# Patient Record
Sex: Female | Born: 1955 | Race: White | Hispanic: No | Marital: Married | Smoking: Never smoker
Health system: Southern US, Community
[De-identification: ages and names within clinical notes are randomized; demographics above are authoritative.]

## PROBLEM LIST (undated history)

## (undated) DIAGNOSIS — E119 Type 2 diabetes mellitus without complications: Secondary | ICD-10-CM

## (undated) DIAGNOSIS — E039 Hypothyroidism, unspecified: Secondary | ICD-10-CM

## (undated) DIAGNOSIS — F419 Anxiety disorder, unspecified: Secondary | ICD-10-CM

## (undated) DIAGNOSIS — F329 Major depressive disorder, single episode, unspecified: Secondary | ICD-10-CM

## (undated) DIAGNOSIS — F32A Depression, unspecified: Secondary | ICD-10-CM

## (undated) DIAGNOSIS — D649 Anemia, unspecified: Secondary | ICD-10-CM

## (undated) HISTORY — DX: Anemia, unspecified: D64.9

## (undated) HISTORY — PX: TONSILLECTOMY: SUR1361

## (undated) HISTORY — DX: Hypothyroidism, unspecified: E03.9

## (undated) HISTORY — DX: Depression, unspecified: F32.A

## (undated) HISTORY — DX: Major depressive disorder, single episode, unspecified: F32.9

## (undated) HISTORY — DX: Anxiety disorder, unspecified: F41.9

## (undated) HISTORY — PX: TUBAL LIGATION: SHX77

---
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
## 1999-06-25 ENCOUNTER — Encounter: Payer: Self-pay | Admitting: Family Medicine

## 1999-06-25 ENCOUNTER — Encounter: Admission: RE | Admit: 1999-06-25 | Discharge: 1999-06-25 | Payer: Self-pay | Admitting: Family Medicine

## 2000-11-04 ENCOUNTER — Encounter: Admission: RE | Admit: 2000-11-04 | Discharge: 2000-11-04 | Payer: Self-pay | Admitting: Internal Medicine

## 2000-11-04 ENCOUNTER — Encounter: Payer: Self-pay | Admitting: Internal Medicine

## 2000-12-06 ENCOUNTER — Other Ambulatory Visit: Admission: RE | Admit: 2000-12-06 | Discharge: 2000-12-06 | Payer: Self-pay | Admitting: Internal Medicine

## 2004-01-26 LAB — HM MAMMOGRAPHY: HM Mammogram: NORMAL

## 2004-02-10 ENCOUNTER — Other Ambulatory Visit: Admission: RE | Admit: 2004-02-10 | Discharge: 2004-02-10 | Payer: Self-pay | Admitting: *Deleted

## 2005-08-04 ENCOUNTER — Ambulatory Visit: Payer: Self-pay | Admitting: Internal Medicine

## 2005-09-28 ENCOUNTER — Ambulatory Visit: Payer: Self-pay | Admitting: Internal Medicine

## 2005-10-05 ENCOUNTER — Ambulatory Visit: Payer: Self-pay | Admitting: Internal Medicine

## 2006-08-30 DIAGNOSIS — E039 Hypothyroidism, unspecified: Secondary | ICD-10-CM

## 2006-08-30 DIAGNOSIS — D649 Anemia, unspecified: Secondary | ICD-10-CM

## 2006-08-30 DIAGNOSIS — F329 Major depressive disorder, single episode, unspecified: Secondary | ICD-10-CM | POA: Insufficient documentation

## 2006-08-30 DIAGNOSIS — F411 Generalized anxiety disorder: Secondary | ICD-10-CM | POA: Insufficient documentation

## 2006-08-30 DIAGNOSIS — O99810 Abnormal glucose complicating pregnancy: Secondary | ICD-10-CM | POA: Insufficient documentation

## 2006-10-03 ENCOUNTER — Ambulatory Visit: Payer: Self-pay | Admitting: Internal Medicine

## 2006-10-03 DIAGNOSIS — D1739 Benign lipomatous neoplasm of skin and subcutaneous tissue of other sites: Secondary | ICD-10-CM | POA: Insufficient documentation

## 2006-10-04 ENCOUNTER — Encounter: Payer: Self-pay | Admitting: Internal Medicine

## 2006-12-13 ENCOUNTER — Encounter: Payer: Self-pay | Admitting: Internal Medicine

## 2007-03-21 ENCOUNTER — Telehealth: Payer: Self-pay | Admitting: *Deleted

## 2007-04-27 ENCOUNTER — Ambulatory Visit: Payer: Self-pay | Admitting: Internal Medicine

## 2007-04-27 LAB — CONVERTED CEMR LAB
Bilirubin Urine: NEGATIVE
Blood in Urine, dipstick: NEGATIVE
Glucose, Urine, Semiquant: NEGATIVE
Ketones, urine, test strip: NEGATIVE
Nitrite: NEGATIVE
Protein, U semiquant: NEGATIVE
Specific Gravity, Urine: 1.025
Urobilinogen, UA: 0.2
WBC Urine, dipstick: NEGATIVE
pH: 5.5

## 2007-05-08 ENCOUNTER — Telehealth: Payer: Self-pay | Admitting: *Deleted

## 2007-05-08 LAB — CONVERTED CEMR LAB
ALT: 28 units/L (ref 0–35)
AST: 25 units/L (ref 0–37)
Albumin: 3.8 g/dL (ref 3.5–5.2)
Alkaline Phosphatase: 56 units/L (ref 39–117)
BUN: 13 mg/dL (ref 6–23)
Bilirubin, Direct: 0.1 mg/dL (ref 0.0–0.3)
CO2: 29 meq/L (ref 19–32)
Calcium: 8.7 mg/dL (ref 8.4–10.5)
Chloride: 101 meq/L (ref 96–112)
Cholesterol: 189 mg/dL (ref 0–200)
Creatinine, Ser: 0.7 mg/dL (ref 0.4–1.2)
GFR calc Af Amer: 114 mL/min
GFR calc non Af Amer: 94 mL/min
Glucose, Bld: 130 mg/dL — ABNORMAL HIGH (ref 70–99)
HDL: 37.7 mg/dL — ABNORMAL LOW (ref >39.0)
LDL Cholesterol: 120 mg/dL — ABNORMAL HIGH (ref 0–99)
Potassium: 4.3 meq/L (ref 3.5–5.1)
Sodium: 137 meq/L (ref 135–145)
TSH: 5.51 microintl units/mL — ABNORMAL HIGH (ref 0.35–5.50)
Total Bilirubin: 0.8 mg/dL (ref 0.3–1.2)
Total CHOL/HDL Ratio: 5
Total Protein: 6.9 g/dL (ref 6.0–8.3)
Triglycerides: 155 mg/dL — ABNORMAL HIGH (ref 0–149)
VLDL: 31 mg/dL (ref 0–40)

## 2009-04-09 ENCOUNTER — Ambulatory Visit: Payer: Self-pay | Admitting: Internal Medicine

## 2009-04-09 LAB — CONVERTED CEMR LAB
ALT: 62 units/L — ABNORMAL HIGH (ref 0–35)
AST: 77 units/L — ABNORMAL HIGH (ref 0–37)
Albumin: 4.1 g/dL (ref 3.5–5.2)
Alkaline Phosphatase: 68 units/L (ref 39–117)
BUN: 11 mg/dL (ref 6–23)
Basophils Absolute: 0 10*3/uL (ref 0.0–0.1)
Basophils Relative: 0.3 % (ref 0.0–3.0)
Bilirubin Urine: NEGATIVE
Bilirubin, Direct: 0.1 mg/dL (ref 0.0–0.3)
CO2: 31 meq/L (ref 19–32)
Calcium: 9.1 mg/dL (ref 8.4–10.5)
Chloride: 106 meq/L (ref 96–112)
Cholesterol: 214 mg/dL — ABNORMAL HIGH (ref 0–200)
Creatinine, Ser: 0.8 mg/dL (ref 0.4–1.2)
Direct LDL: 163.8 mg/dL
Eosinophils Absolute: 0.2 10*3/uL (ref 0.0–0.7)
Eosinophils Relative: 4 % (ref 0.0–5.0)
GFR calc non Af Amer: 79.99 mL/min (ref >60)
Glucose, Bld: 137 mg/dL — ABNORMAL HIGH (ref 70–99)
Glucose, Urine, Semiquant: NEGATIVE
HCT: 37.8 % (ref 36.0–46.0)
HDL: 46.1 mg/dL (ref >39.00)
Hemoglobin: 11.9 g/dL — ABNORMAL LOW (ref 12.0–15.0)
Ketones, urine, test strip: NEGATIVE
Lymphocytes Relative: 34.2 % (ref 12.0–46.0)
Lymphs Abs: 1.8 10*3/uL (ref 0.7–4.0)
MCHC: 31.4 g/dL (ref 30.0–36.0)
MCV: 83.8 fL (ref 78.0–100.0)
Monocytes Absolute: 0.4 10*3/uL (ref 0.1–1.0)
Monocytes Relative: 8.2 % (ref 3.0–12.0)
Neutro Abs: 2.8 10*3/uL (ref 1.4–7.7)
Neutrophils Relative %: 53.3 % (ref 43.0–77.0)
Nitrite: NEGATIVE
Platelets: 212 10*3/uL (ref 150.0–400.0)
Potassium: 4.4 meq/L (ref 3.5–5.1)
Protein, U semiquant: NEGATIVE
RBC: 4.51 M/uL (ref 3.87–5.11)
RDW: 17.4 % — ABNORMAL HIGH (ref 11.5–14.6)
Sodium: 142 meq/L (ref 135–145)
Specific Gravity, Urine: 1.025
TSH: 45.69 microintl units/mL — ABNORMAL HIGH (ref 0.35–5.50)
Total Bilirubin: 1 mg/dL (ref 0.3–1.2)
Total CHOL/HDL Ratio: 5
Total Protein: 7.9 g/dL (ref 6.0–8.3)
Triglycerides: 164 mg/dL — ABNORMAL HIGH (ref 0.0–149.0)
Urobilinogen, UA: 0.2
VLDL: 32.8 mg/dL (ref 0.0–40.0)
WBC Urine, dipstick: NEGATIVE
WBC: 5.2 10*3/uL (ref 4.5–10.5)
pH: 6

## 2009-04-16 ENCOUNTER — Other Ambulatory Visit: Admission: RE | Admit: 2009-04-16 | Discharge: 2009-04-16 | Payer: Self-pay | Admitting: Internal Medicine

## 2009-04-16 ENCOUNTER — Ambulatory Visit: Payer: Self-pay | Admitting: Internal Medicine

## 2009-04-16 DIAGNOSIS — R7309 Other abnormal glucose: Secondary | ICD-10-CM | POA: Insufficient documentation

## 2009-04-16 DIAGNOSIS — R945 Abnormal results of liver function studies: Secondary | ICD-10-CM | POA: Insufficient documentation

## 2009-04-16 DIAGNOSIS — D485 Neoplasm of uncertain behavior of skin: Secondary | ICD-10-CM

## 2009-04-16 DIAGNOSIS — Z780 Asymptomatic menopausal state: Secondary | ICD-10-CM | POA: Insufficient documentation

## 2009-04-16 LAB — HM PAP SMEAR

## 2009-04-30 ENCOUNTER — Telehealth: Payer: Self-pay | Admitting: Internal Medicine

## 2009-05-19 ENCOUNTER — Ambulatory Visit: Payer: Self-pay | Admitting: Internal Medicine

## 2009-05-19 ENCOUNTER — Ambulatory Visit: Payer: Self-pay | Admitting: Cardiovascular Disease

## 2009-05-19 DIAGNOSIS — R51 Headache: Secondary | ICD-10-CM

## 2009-05-19 DIAGNOSIS — R519 Headache, unspecified: Secondary | ICD-10-CM | POA: Insufficient documentation

## 2009-05-19 LAB — CONVERTED CEMR LAB
HCV Ab: NEGATIVE
Hep B S Ab: NEGATIVE
Hepatitis B Surface Ag: NEGATIVE

## 2009-05-26 LAB — CONVERTED CEMR LAB
ALT: 65 units/L — ABNORMAL HIGH (ref 0–35)
AST: 76 units/L — ABNORMAL HIGH (ref 0–37)
Albumin: 4.3 g/dL (ref 3.5–5.2)
Alkaline Phosphatase: 59 units/L (ref 39–117)
BUN: 10 mg/dL (ref 6–23)
Bilirubin, Direct: 0 mg/dL (ref 0.0–0.3)
CO2: 30 meq/L (ref 19–32)
Calcium: 9 mg/dL (ref 8.4–10.5)
Chloride: 99 meq/L (ref 96–112)
Creatinine, Ser: 0.7 mg/dL (ref 0.4–1.2)
Free T4: 1 ng/dL (ref 0.6–1.6)
GFR calc non Af Amer: 93.28 mL/min (ref >60)
Glucose, Bld: 115 mg/dL — ABNORMAL HIGH (ref 70–99)
Hgb A1c MFr Bld: 6.9 % — ABNORMAL HIGH (ref 4.6–6.5)
Potassium: 4 meq/L (ref 3.5–5.1)
Sodium: 139 meq/L (ref 135–145)
TSH: 6.36 microintl units/mL — ABNORMAL HIGH (ref 0.35–5.50)
Total Bilirubin: 1.3 mg/dL — ABNORMAL HIGH (ref 0.3–1.2)
Total Protein: 8.1 g/dL (ref 6.0–8.3)

## 2009-07-03 ENCOUNTER — Ambulatory Visit: Payer: Self-pay | Admitting: Internal Medicine

## 2010-01-01 ENCOUNTER — Ambulatory Visit: Payer: Self-pay | Admitting: Internal Medicine

## 2010-02-22 LAB — CONVERTED CEMR LAB: Pap Smear: NEGATIVE

## 2010-02-24 NOTE — Assessment & Plan Note (Signed)
Summary: cpx/ssc   Vital Signs:  Patient profile:   55 year old female Menstrual status:  perimenopausal LMP:     08/25/2008 Height:      68 inches Weight:      270 pounds BMI:     41.20 Pulse rate:   66 / minute BP sitting:   120 / 80  (right arm) Cuff size:   large  Vitals Entered By: Romualdo Bolk, CMA (AAMA) (April 16, 2009 10:40 AM)  Nutrition Counseling: Patient's BMI is greater than 25 and therefore counseled on weight management options. CC: CPX with pap  Vision Screening:Left eye with correction: 20 / 15 Right eye with correction: 20 / 20 Both eyes with correction: 20 / 15        Vision Entered By: Romualdo Bolk, CMA [name] (April 16, 2009 10:57 AM) LMP (date): 08/25/2008 LMP - Character: 6 months ago Menarche (age onset years): 13    Menstrual Status perimenopausal Enter LMP: 08/25/2008   History of Present Illness: Alisha Williams comes in today for  preventive visit. Sh has not been in for a while as taking care of everyone else.  had stopped  synthroid for cost reasons when lost job. Now has a new one.  Has gained weight an is very fatigues . snores but no OSA symptom .  Sinus HA  hurts for 3 days.   spring and fall and face pain and then progresses.  Tried claritin and 3 advil  and went to bed.  Last period   6 months ago.   Had been fairly regular.   Preventive Care Screening  Mammogram:    Date:  01/26/2004    Results:  normal    Preventive Screening-Counseling & Management  Alcohol-Tobacco     Alcohol drinks/day: 0     Smoking Status: never  Caffeine-Diet-Exercise     Caffeine use/day: 1     Does Patient Exercise: no  Hep-HIV-STD-Contraception     Dental Visit-last 6 months no     Sun Exposure-Excessive: no  Safety-Violence-Falls     Seat Belt Use: yes     Firearms in the Home: no firearms in the home     Smoke Detectors: yes  EKG  Procedure date:  04/16/2009  Findings:      Normal sinus rhythm with rate of:   86  Current Medications (verified): 1)  Synthroid 100 Mcg  Tabs (Levothyroxine Sodium) .Marland Kitchen.. 1 By Mouth Once Daily 2)  Ferrous Sulfate 325 (65 Fe) Mg  Tabs (Ferrous Sulfate) 3)  Multiple Vitamin   Tabs (Multiple Vitamin)  Allergies (verified): No Known Drug Allergies  Past History:  Past medical, surgical, family and social histories (including risk factors) reviewed, and no changes noted (except as noted below).  Past Medical History: Anemia-NOS Anxiety Depression Hypothyroidism G3P3  Past Surgical History: Tonsillectomy Tubal ligation C-Section "Stretch kidney tube" 1970  Past History:  Care Management: None Current  Family History: Reviewed history and no changes required. Father: Heart Attack- now healthy and on no meds Mother: thyroid problems, HBP, fibromyalgia, arthristis, breast cancer Siblings: Sister- Breast Cancer  dx age 43  and thyroid problems   Social History: Reviewed history from 10/03/2006 and no changes required. 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  Caffeine use/day:  1 Dental Care w/in 6 mos.:  no Sun Exposure-Excessive:  no Risk analyst Use:  yes  Review of Systems       The patient complains of  weight gain.  The patient denies anorexia, fever, weight loss, vision loss, decreased hearing, hoarseness, chest pain, syncope, dyspnea on exertion, peripheral edema, prolonged cough, headaches, hemoptysis, abdominal pain, melena, hematochezia, severe indigestion/heartburn, hematuria, genital sores, muscle weakness, transient blindness, difficulty walking, depression, unusual weight change, abnormal bleeding, enlarged lymph nodes, angioedema, and breast masses.         monitoring  2 moles on back    Physical Exam General Appearance: well developed, well nourished, no acute distress Eyes: conjunctiva and lids normal, PERRLA, EOMI,  WNL Ears, Nose, Mouth, Throat: TM clear, nares clear, oral exam WNL Neck:  supple, no lymphadenopathy, no thyromegaly, no JVD Respiratory: clear to auscultation and percussion, respiratory effort normal Cardiovascular: regular rate and rhythm, S1-S2, no murmur, rub or gallop, no bruits, peripheral pulses normal and symmetric, no cyanosis, clubbing, edema or varicosities Chest: no scars, masses, tenderness; no asymmetry, skin changes, nipple discharge   Gastrointestinal: soft, non-tender; no hepatosplenomegaly, masses; active bowel sounds all quadrants, guaiac negative stool; no masses, tenderness, hemorrhoids  Genitourinary: no vaginal discharge, lesions; no masses or tenderness exam limited by large abdlominal wall     Pap done  creamy discharge. Lymphatic: no cervical, axillary or inguinal adenopathy Musculoskeletal: gait normal, muscle tone and strength WNL, no joint swelling, effusions, discoloration, crepitus  Skin: clear, good turgor, color WNL, no rashes, , or ulcerations  back with 3 mm very dark slightly raised mole  slight irreg border .   large lipoma left back non tender Neurologic: normal mental status, normal reflexes, normal strength, sensation, and motion Psychiatric: alert; oriented to person, place and time Other Exam:  EKG nl   LAb  elevated fbs 137  elevated transaminases, TSH over 40   hg 11.9     Impression & Recommendations:  Problem # 1:  PREVENTIVE HEALTH CARE (ICD-V70.0)  Discussed nutrition,exercise,diet,healthy weight, vitamin D and calcium.   colonoscopy referral. encourage tdap  booster  Orders: EKG w/ Interpretation (93000)  Problem # 2:  ROUTINE GYNECOLOGICAL EXAM (ICD-V72.31) pap due   Problem # 3:  PERIMENOPAUSAL STATUS (ICD-V49.81) disc   Expectant management   Problem # 4:  HYPERGLYCEMIA (ICD-790.29) t high risk for daibetes and has had sig weight gain since off thyroid replacement.  lifestyle intervention for now and follow up  may need to add medication   Problem # 5:  LIVER FUNCTION TESTS, ABNORMAL  (ICD-794.8) Assessment: New poss from  fatty liver weill follow up   Problem # 6:  HYPOTHYROIDISM (ICD-244.9)  off med for a while and metabolic and symptom effect  Her updated medication list for this problem includes:    Synthroid 100 Mcg Tabs (Levothyroxine sodium) .Marland Kitchen... 1 by mouth once daily    Levothyroxine Sodium 100 Mcg Tabs (Levothyroxine sodium) .Marland Kitchen... 1 by mouth once daily  Labs Reviewed: TSH: 45.69 (04/09/2009)    Chol: 214 (04/09/2009)   HDL: 46.10 (04/09/2009)   LDL: 120 (04/27/2007)   TG: 164.0 (04/09/2009)  Problem # 7:  LIPOMA, SKIN (ICD-214.1) about the same   Problem # 8:  NEOPLASM, SKIN, UNCERTAIN BEHAVIOR (ICD-238.2) Assessment: New  back mole  although small is concerning and needs to be checked by derm  .  Orders: Dermatology Referral (Derma)  Problem # 9:  allergies  and ha  disc conssrvative rx   follow up if needed  Complete Medication List: 1)  Synthroid 100 Mcg Tabs (Levothyroxine sodium) .Marland Kitchen.. 1 by mouth once daily 2)  Ferrous Sulfate 325 (65 Fe) Mg Tabs (Ferrous sulfate)  3)  Multiple Vitamin Tabs (Multiple vitamin) 4)  Levothyroxine Sodium 100 Mcg Tabs (Levothyroxine sodium) .Marland Kitchen.. 1 by mouth once daily  Other Orders: Gastroenterology Referral (GI) Tdap => 68yrs IM (16109) Admin 1st Vaccine (60454)  Patient Instructions: 1)  try daily claritin consider nasal steroid also for allergy. 2)  Derm referral. 3)  Begin   thyroid medication 4)  BMP prior to visit, ICD-9:  5)  Hepatic Panel prior to visit ICD-9:  6)  TSH prior to visit ICD-9 :  244.9 7)  HgBA1c prior to visit  ICD-9:  8)  hepatitis  B ag , hep B aby hep c aby  dx abnormal lfts  9)  Please schedule a follow-up appointment in 6-8 weeks  10)    11)  You need to lose weight. Consider a lower calorie diet and regular exercise.  12)  It is important that you exercise reguarly at least 20 minutes 5 times a week. If you develop chest pain, have severe difficulty breathing, or feel very  tired, stop exercising immediately and seek medical attention.  Prescriptions: LEVOTHYROXINE SODIUM 100 MCG TABS (LEVOTHYROXINE SODIUM) 1 by mouth once daily  #90 x 2   Entered and Authorized by:   Madelin Headings MD   Signed by:   Madelin Headings MD on 04/16/2009   Method used:   Print then Give to Patient   RxID:   [id number]    Immunizations Administered:  Tetanus Vaccine:    Vaccine Type: Tdap    Site: right deltoid    Mfr: [email or web address]    Dose: 0.5 ml    Route: IM    Given by: Romualdo Bolk, CMA (AAMA)    Exp. Date: 04/19/2011    Lot #: [id number]

## 2010-02-24 NOTE — Progress Notes (Signed)
----   Converted from flag ---- ---- 04/29/2009 11:27 AM, Romualdo Bolk, CMA (AAMA) wrote:   ---- 04/29/2009 11:20 AM, Corky Mull wrote: Revonda Standard from [facility] called to say pt has declined to have colonoscopy done right now - pt would re-consider in Sept or Oct.    I will need to cancel this referral, so just send me another one around Sept. Thanks. ------------------------------

## 2010-02-24 NOTE — Assessment & Plan Note (Signed)
Summary: MIGRAINE H/A // RS   Vital Signs:  Patient profile:   55 year old female Menstrual status:  perimenopausal Weight:      264 pounds Temp:     98.5 degrees F oral Pulse rate:   72 / minute BP sitting:   130 / 70  (right arm) Cuff size:   large  Vitals Entered By: Romualdo Bolk, CMA (AAMA) (May 19, 2009 9:52 AM) CC: Headaches x 5 weeks- Pt is having senstivitiy to light and sound. Pt is also having nausea with the ha's. Headache is behind her eyes and in the back of her head.   History of Present Illness: Alisha Williams comesin comes in today  as an SDA for above problem .  See last check up  and due for labs in the future  Ongoing HAs  since last visit and claritin and advil helped   some but since then  HA didnt go away and  tried  advil  and  excedrin Migraine     after a while no help and stopped.  for 3 days ago.   some worse . Waxes and wanes.      HAs usually not a problem.  No vision changes  novomiting  nausea    sensitive to light and sound.   8/10     level of pain.  No weakness or numbness balance problem or speech problems.  Preventive Screening-Counseling & Management  Alcohol-Tobacco     Alcohol drinks/day: 0     Smoking Status: never  Caffeine-Diet-Exercise     Caffeine use/day: 1     Does Patient Exercise: no  Current Medications (verified): 1)  Ferrous Sulfate 325 (65 Fe) Mg  Tabs (Ferrous Sulfate) 2)  Multiple Vitamin   Tabs (Multiple Vitamin) 3)  Levothyroxine Sodium 100 Mcg Tabs (Levothyroxine Sodium) .Marland Kitchen.. 1 By Mouth Once Daily  Allergies (verified): No Known Drug Allergies  Past History:  Past medical, surgical, family and social histories (including risk factors) reviewed, and no changes noted (except as noted below).  Past Medical History: Reviewed history from 04/16/2009 and no changes required. Anemia-NOS Anxiety Depression Hypothyroidism G3P3  Past Surgical History: Reviewed history from 04/16/2009 and no changes  required. Tonsillectomy Tubal ligation C-Section "Stretch kidney tube" 1970  Past History:  Care Management: None Current  Family History: Reviewed history from 04/16/2009 and no changes required. Father: Heart Attack- now healthy and on no meds Mother: thyroid problems, HBP, fibromyalgia, arthristis, breast cancer Siblings: Sister- Breast Cancer  dx age 69  and thyroid problems   Social History: Reviewed history from 04/16/2009 and no changes required. 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   Review of Systems       The patient complains of anorexia.  The patient denies weight loss, weight gain, vision loss, decreased hearing, hoarseness, chest pain, syncope, dyspnea on exertion, peripheral edema, prolonged cough, hemoptysis, abdominal pain, melena, hematochezia, muscle weakness, transient blindness, difficulty walking, abnormal bleeding, enlarged lymph nodes, and angioedema.    Physical Exam  General:  well-developed, well-nourished, and well-hydrated. uncomfortable in nad  non toxic  Head:  normocephalic and atraumatic.   Eyes:  vision grossly intact, pupils equal, pupils round, and pupils reactive to light. wears glasses   eoms nl fundi ?  discrete discs  Ears:  R ear normal and L ear normal.   Mouth:  pharynx pink and moist.   tongue midline Neck:  No deformities, masses, or tenderness  noted. Lungs:  Normal respiratory effort, chest expands symmetrically. Lungs are clear to auscultation, no crackles or wheezes. Heart:  Normal rate and regular rhythm. S1 and S2 normal without gallop, murmur, click, rub or other extra sounds. Abdomen:  soft, non-tender, no hepatomegaly, and no splenomegaly.   Pulses:  pulses intact without delay   Extremities:  no clubbing cyanosis or edema  Neurologic:  alert & oriented X3, cranial nerves II-XII intact, strength normal in all extremities, gait normal, DTRs symmetrical and normal, finger-to-nose  normal, and Romberg negative.   Skin:  turgor normal, color normal, no ecchymoses, and no petechiae.   Cervical Nodes:  No lymphadenopathy noted Psych:  Oriented X3, good eye contact, not anxious appearing, and not depressed appearing.     Impression & Recommendations:  Problem # 1:  HEADACHE (ICD-784.0) Assessment New new kind in that it is protracted and ongoing   and   taking  daily meds at present   non focal exam   today.  disc optinos for rx and  follow up  get imaging  today.   Her updated medication list for this problem includes:    Norco 5-325 Mg Tabs (Hydrocodone-acetaminophen) .Marland Kitchen... 1 by mouth q4-6 hours as needed rescue medicine for ha  Orders: Radiology Referral (Radiology) Ketorolac-Toradol 15mg  [phone]) Admin of Therapeutic Inj  intramuscular or subcutaneous (13086)  Problem # 2:  HYPOTHYROIDISM (ICD-244.9) started  thyroid med recently but dont really think thats the problem    will check labs today a bit early     The following medications were removed from the medication list:    Synthroid 100 Mcg Tabs (Levothyroxine sodium) .Marland Kitchen... 1 by mouth once daily Her updated medication list for this problem includes:    Levothyroxine Sodium 100 Mcg Tabs (Levothyroxine sodium) .Marland Kitchen... 1 by mouth once daily  Orders: TLB-TSH (Thyroid Stimulating Hormone) (84443-TSH) TLB-T4 (Thyrox), Free [phone])  Problem # 3:  LIVER FUNCTION TESTS, ABNORMAL (ICD-794.8) Assessment: Comment Only  Orders: TLB-Hepatic/Liver Function Pnl (80076-HEPATIC) T-Hepatitis B Surface Antigen (52841-32440) T-Hepatitis B Surface Antibody (10272-53664) T-Hepatitis C Antibody (40347-42595)  Problem # 4:  HYPERGLYCEMIA (ICD-790.29) check today.    may be early diabetic or prediabetic. Orders: TLB-A1C / Hgb A1C (Glycohemoglobin) (83036-A1C)  Complete Medication List: 1)  Ferrous Sulfate 325 (65 Fe) Mg Tabs (Ferrous sulfate) 2)  Multiple Vitamin Tabs (Multiple vitamin) 3)  Levothyroxine Sodium 100  Mcg Tabs (Levothyroxine sodium) .Marland Kitchen.. 1 by mouth once daily 4)  Norco 5-325 Mg Tabs (Hydrocodone-acetaminophen) .Marland Kitchen.. 1 by mouth q4-6 hours as needed rescue medicine for ha 5)  Promethazine Hcl 25 Mg Tabs (Promethazine hcl) .Marland Kitchen.. 1 by mouth q4-6 hours as needed nausea  Other Orders: Venipuncture (63875) TLB-BMP (Basic Metabolic Panel-BMET) (80048-METABOL)  Patient Instructions: 1)  Get ct scan  2)  .labs  and if not better consider HA referral  3)  Pain med as  helpful   4)  call tomorrow as needed.  Prescriptions: PROMETHAZINE HCL 25 MG TABS (PROMETHAZINE HCL) 1 by mouth q4-6 hours as needed nausea  #12 x 0   Entered and Authorized by:   Madelin Headings MD   Signed by:   Madelin Headings MD on 05/19/2009   Method used:   Print then Give to Patient   RxID:   [id number] NORCO 5-325 MG TABS (HYDROCODONE-ACETAMINOPHEN) 1 by mouth q4-6 hours as needed rescue medicine for HA  #10 x 0   Entered and Authorized by:   Madelin Headings MD   Signed  by:   Madelin Headings MD on 05/19/2009   Method used:   Print then Give to Patient   RxID:   [id number]    Medication Administration  Injection # 1:    Medication: Ketorolac-Toradol 15mg     Diagnosis: HEADACHE (ICD-784.0)    Route: IM    Site: RUOQ gluteus    Exp Date: 08/26/2010    Lot #: [id number]    Mfr: [email or web address]    Patient tolerated injection without complications    Given by: Romualdo Bolk, CMA [name] (May 19, 2009 10:34 AM)  Orders Added: 1)  Venipuncture [14782] 2)  TLB-BMP (Basic Metabolic Panel-BMET) [80048-METABOL] 3)  TLB-TSH (Thyroid Stimulating Hormone) [84443-TSH] 4)  TLB-Hepatic/Liver Function Pnl [80076-HEPATIC] 5)  TLB-A1C / Hgb A1C (Glycohemoglobin) [83036-A1C] 6)  TLB-T4 (Thyrox), Free [95621-HY8M] 7)  T-Hepatitis B Surface Antigen [57846-96295] 8)  T-Hepatitis B Surface Antibody [28413-24401] 9)  T-Hepatitis C Antibody [02725-36644] 10)  Radiology Referral [Radiology] 11)  Ketorolac-Toradol 15mg   [J1885] 12)  Admin of Therapeutic Inj  intramuscular or subcutaneous [96372] 13)  Est. Patient Level IV [03474]

## 2010-10-22 ENCOUNTER — Encounter: Payer: Self-pay | Admitting: Family Medicine

## 2010-10-22 ENCOUNTER — Encounter: Payer: Self-pay | Admitting: Internal Medicine

## 2010-10-22 ENCOUNTER — Ambulatory Visit (INDEPENDENT_AMBULATORY_CARE_PROVIDER_SITE_OTHER): Payer: BC Managed Care – PPO | Admitting: Family Medicine

## 2010-10-22 VITALS — BP 130/82 | Temp 98.8°F | Wt 262.0 lb

## 2010-10-22 DIAGNOSIS — E039 Hypothyroidism, unspecified: Secondary | ICD-10-CM

## 2010-10-22 DIAGNOSIS — R300 Dysuria: Secondary | ICD-10-CM

## 2010-10-22 LAB — POCT URINALYSIS DIPSTICK: Glucose, UA: NEGATIVE

## 2010-10-22 MED ORDER — CIPROFLOXACIN HCL 500 MG PO TABS: 500.0000 mg | ORAL_TABLET | Freq: Two times a day (BID) | ORAL | Status: AC

## 2010-10-22 MED ORDER — LEVOTHYROXINE SODIUM 100 MCG PO TABS: 100.0000 ug | ORAL_TABLET | Freq: Every day | ORAL | Status: DC

## 2010-10-22 NOTE — Patient Instructions (Signed)
Urinary Tract Infection (UTI)   Infections of the urinary tract can start in several places. A bladder infection (cystitis), a kidney infection (pyelonephritis), and a prostate infection (prostatitis) are different types of urinary tract infections. They usually get better if treated with medicines (antibiotics) that kill germs. Take all the medicine until it is gone. You or your child may feel better in a few days, but TAKE ALL MEDICINE or the infection may not respond and may become more difficult to treat.   HOME CARE INSTRUCTIONS   Drink enough water and fluids to keep the urine clear or pale yellow. Cranberry juice is especially recommended, in addition to large amounts of water.   Avoid caffeine, tea, and carbonated beverages. They tend to irritate the bladder.   Alcohol may irritate the prostate.   Only take over-the-counter or prescription medicines for pain, discomfort, or fever as directed by your caregiver.   FINDING OUT THE RESULTS OF YOUR TEST   Not all test results are available during your visit. If your or your child's test results are not back during the visit, make an appointment with your caregiver to find out the results. Do not assume everything is normal if you have not heard from your caregiver or the medical facility. It is important for you to follow up on all test results.   TO PREVENT FURTHER INFECTIONS:   Empty the bladder often. Avoid holding urine for long periods of time.   After a bowel movement, women should cleanse from front to back. Use each tissue only once.   Empty the bladder before and after sexual intercourse.   SEEK MEDICAL CARE IF:   There is back pain.   You or your child has an oral temperature above 101.   Your baby is older than 3 months with a rectal temperature of 100.5º F (38.1° C) or higher for more than 1 day.   Your or your child's problems (symptoms) are no better in 3 days. Return sooner if you or your child is getting worse.   SEEK IMMEDIATE MEDICAL CARE IF:    There is severe back pain or lower abdominal pain.   You or your child develops chills.   You or your child has an oral temperature above 101, not controlled by medicine.   Your baby is older than 3 months with a rectal temperature of 102º F (38.9º C) or higher.   Your baby is 3 months old or younger with a rectal temperature of 100.4º F (38º C) or higher.   There is nausea or vomiting.   There is continued burning or discomfort with urination.   MAKE SURE YOU:   Understand these instructions.   Will watch this condition.   Will get help right away if you or your child is not doing well or gets worse.   Document Released: 10/21/2004 Document Re-Released: 04/07/2009   [employer]® Patient Information ©2011 [employer].

## 2010-10-22 NOTE — Progress Notes (Signed)
  Subjective:    Patient ID: Alisha Williams, female    DOB: 01-03-1956, 55 y.o.   MRN: [id number]  HPI Patient seen with 3 day history of dysuria. Frequent urination and burning with urination. No fever or chills. No nausea or vomiting. Patient has no known drug allergies. Took Azo-Standard which helps symptoms somewhat until earlier today.  Hypothyroidism. Recently ran out of insurance coverage and has been off this medication for several weeks.   Review of Systems  Constitutional: Positive for fatigue. Negative for fever and chills.  Gastrointestinal: Negative for nausea, vomiting and abdominal pain.  Genitourinary: Positive for dysuria and urgency. Negative for hematuria.       Objective:   Physical Exam  Constitutional: She appears well-developed and well-nourished.  HENT:  Mouth/Throat: Oropharynx is clear and moist.  Cardiovascular: Normal rate, regular rhythm and normal heart sounds.   Pulmonary/Chest: Effort normal and breath sounds normal. No respiratory distress. She has no wheezes. She has no rales.  Neurological: She is alert.          Assessment & Plan:  #1 uncomplicated cystitis. Cipro 500 mg twice daily for 5 days #2 hypothyroidism. Get back on levothyroxin 100 mcg daily and followup with primary 3 months to reassess

## 2012-10-24 ENCOUNTER — Encounter (HOSPITAL_COMMUNITY): Payer: Self-pay | Admitting: Emergency Medicine

## 2012-10-24 ENCOUNTER — Emergency Department (HOSPITAL_COMMUNITY)
Admission: EM | Admit: 2012-10-24 | Discharge: 2012-10-25 | Disposition: A | Discharge status: Home / Self Care | Payer: BC Managed Care – PPO | Attending: Emergency Medicine | Admitting: Emergency Medicine

## 2012-10-24 ENCOUNTER — Emergency Department (HOSPITAL_COMMUNITY): Payer: BC Managed Care – PPO

## 2012-10-24 DIAGNOSIS — E039 Hypothyroidism, unspecified: Secondary | ICD-10-CM | POA: Insufficient documentation

## 2012-10-24 DIAGNOSIS — Y939 Activity, unspecified: Secondary | ICD-10-CM | POA: Insufficient documentation

## 2012-10-24 DIAGNOSIS — Z8659 Personal history of other mental and behavioral disorders: Secondary | ICD-10-CM | POA: Insufficient documentation

## 2012-10-24 DIAGNOSIS — R0789 Other chest pain: Secondary | ICD-10-CM

## 2012-10-24 DIAGNOSIS — W1809XA Striking against other object with subsequent fall, initial encounter: Secondary | ICD-10-CM | POA: Insufficient documentation

## 2012-10-24 DIAGNOSIS — Z862 Personal history of diseases of the blood and blood-forming organs and certain disorders involving the immune mechanism: Secondary | ICD-10-CM | POA: Insufficient documentation

## 2012-10-24 DIAGNOSIS — S298XXA Other specified injuries of thorax, initial encounter: Secondary | ICD-10-CM | POA: Insufficient documentation

## 2012-10-24 DIAGNOSIS — Y92009 Unspecified place in unspecified non-institutional (private) residence as the place of occurrence of the external cause: Secondary | ICD-10-CM | POA: Insufficient documentation

## 2012-10-24 MED ORDER — OXYCODONE-ACETAMINOPHEN 5-325 MG PO TABS
2.0000 | ORAL_TABLET | Freq: Once | ORAL | Status: AC
  Administered 2012-10-24: 2 via ORAL
  Filled 2012-10-24: qty 2

## 2012-10-24 MED ORDER — OXYCODONE-ACETAMINOPHEN 5-325 MG PO TABS: 1.0000 | ORAL_TABLET | Freq: Four times a day (QID) | ORAL | Status: DC | PRN

## 2012-10-24 NOTE — ED Notes (Signed)
Pt. Assisted to by by two RNs.

## 2012-10-24 NOTE — ED Notes (Signed)
Pt. tripped and fell at home this evening hit her right anterior chest against toilet seat , no LOC / ambulatory , pain at right anterior ribcage worse with movement /palpation and deep inspiration . Respirations unlabored.

## 2012-10-24 NOTE — ED Provider Notes (Signed)
CSN: [id number]     Arrival date & time 10/24/12  2027 History  This chart was scribed for non-physician practitioner Felicie Morn, NP, working with Toy Baker, MD by Ronal Fear, ED scribe. This patient was seen in room TR11C/TR11C and the patient's care was started at 11:21 PM.   Chief Complaint  Patient presents with  . Fall    Patient is a 57 y.o. female presenting with fall. The history is provided by the patient. No language interpreter was used.  Fall This is a new problem. The current episode started 3 to 5 hours ago. The problem occurs rarely. The problem has not changed since onset.Pertinent negatives include no chest pain and no shortness of breath. The symptoms are aggravated by bending, twisting and walking (deep breaths, palpation, and movement). Nothing relieves the symptoms.    HPI Comments: Lakima Dona is a 57 y.o. female who presents to the Emergency Department complaining of right lower chest wall pain after tripping, falling and hitting the toilet seat. She denies LOC and any other injuries.  Pt does not seem to be in any acute distress, and has no complaints other than chest wall pain .       Past Medical History  Diagnosis Date  . Anemia     nos  . Anxiety   . Depression   . Hypothyroidism    Past Surgical History  Procedure Laterality Date  . Tonsillectomy    . Tubal ligation    . Cesarean section    . Strectch kidney tube  1970   Family History  Problem Relation Age of Onset  . Thyroid disease Mother   . Hypertension Mother   . Arthritis Mother   . Fibromyalgia Mother   . Breast cancer Mother   . Heart attack Father   . Breast cancer Sister   . Thyroid disease Sister    History  Substance Use Topics  . Smoking status: Never Smoker   . Smokeless tobacco: Not on file  . Alcohol Use: No   OB History   Grav Para Term Preterm Abortions TAB SAB Ect Mult Living                 Review of Systems  Respiratory: Negative for shortness of breath.    Cardiovascular: Negative for chest pain.  Musculoskeletal:       Chest wall tenderness  All other systems reviewed and are negative.    Allergies  Review of patient's allergies indicates no known allergies.  Home Medications   Current Outpatient Rx  Name  Route  Sig  Dispense  Refill  . benzocaine (ORAJEL) 10 % mucosal gel   Mouth/Throat   Use as directed 1 application in the mouth or throat as needed for pain.         . ferrous sulfate 325 (65 FE) MG tablet   Oral   Take 325 mg by mouth daily with breakfast.           . MULTIPLE VITAMIN PO   Oral   Take 1 tablet by mouth daily.           BP 143/79  Pulse 87  Temp(Src) 98.7 F (37.1 C) (Oral)  Resp 16  Ht 5\' 8"  (1.727 m)  Wt 262 lb (118.842 kg)  BMI 39.85 kg/m2  SpO2 99% Physical Exam  Nursing note and vitals reviewed. Constitutional: She is oriented to person, place, and time. She appears well-developed and well-nourished. No distress.  HENT:  Head: Normocephalic and atraumatic.  Eyes: EOM are normal.  Neck: Neck supple. No tracheal deviation present.  Cardiovascular: Normal rate, regular rhythm and normal heart sounds.   Pulmonary/Chest: Effort normal and breath sounds normal. No respiratory distress. She exhibits tenderness.  Ecchymosis to affected area  Abdominal: Soft. There is no tenderness.  Musculoskeletal: Normal range of motion. She exhibits tenderness.  Neurological: She is alert and oriented to person, place, and time.  Skin: Skin is warm and dry.  Psychiatric: She has a normal mood and affect. Her behavior is normal.    ED Course  Procedures (including critical care time)  DIAGNOSTIC STUDIES: Oxygen Saturation is 99% on RA, normal by my interpretation.    COORDINATION OF CARE: 11:22 PM- Pt advised of plan for treatment including medication for pain, and advised to splint with pillow and do deep breathing excercises and pt agrees.      Labs Review Labs Reviewed - No data to  display Imaging Review Dg Ribs Unilateral W/chest Right  10/24/2012   CLINICAL DATA:  Patient fell landing on her right side. Right-sided chest wall pain.  EXAM: RIGHT RIBS AND CHEST - 3+ VIEW  COMPARISON:  None.  FINDINGS: No fracture or other bone lesions are seen involving the ribs. There is no evidence of pneumothorax or pleural effusion. Both lungs are clear. Heart size and mediastinal contours are within normal limits.  IMPRESSION: Negative.   Electronically Signed   By: Amie Portland   On: 10/24/2012 21:46   Radiology results reviewed and shared with patient.   MDM  Chest wall pain.  I personally performed the services described in this documentation, which was scribed in my presence. The recorded information has been reviewed and is accurate.    Jimmye Norman, NP 10/25/12 [phone]

## 2012-10-24 NOTE — ED Notes (Signed)
Pt is in xray then know to bring her back to ft 11

## 2012-10-25 NOTE — ED Notes (Signed)
Pt. Given incentive spirometer and taught how to use device. Verbalized understanding.

## 2012-10-27 NOTE — ED Provider Notes (Signed)
Medical screening examination/treatment/procedure(s) were performed by non-physician practitioner and as supervising physician I was immediately available for consultation/collaboration.  Cassaundra Rasch T Vera Furniss, MD 10/27/12 0709 

## 2013-01-29 ENCOUNTER — Ambulatory Visit (INDEPENDENT_AMBULATORY_CARE_PROVIDER_SITE_OTHER): Payer: BC Managed Care – PPO | Admitting: Internal Medicine

## 2013-01-29 ENCOUNTER — Encounter: Payer: Self-pay | Admitting: Internal Medicine

## 2013-01-29 VITALS — BP 130/80 | HR 87 | Temp 98.2°F | Wt 247.0 lb

## 2013-01-29 DIAGNOSIS — E039 Hypothyroidism, unspecified: Secondary | ICD-10-CM

## 2013-01-29 DIAGNOSIS — R32 Unspecified urinary incontinence: Secondary | ICD-10-CM | POA: Insufficient documentation

## 2013-01-29 DIAGNOSIS — R3989 Other symptoms and signs involving the genitourinary system: Secondary | ICD-10-CM

## 2013-01-29 DIAGNOSIS — IMO0002 Reserved for concepts with insufficient information to code with codable children: Secondary | ICD-10-CM

## 2013-01-29 DIAGNOSIS — R399 Unspecified symptoms and signs involving the genitourinary system: Secondary | ICD-10-CM | POA: Insufficient documentation

## 2013-01-29 DIAGNOSIS — E1165 Type 2 diabetes mellitus with hyperglycemia: Secondary | ICD-10-CM

## 2013-01-29 DIAGNOSIS — E119 Type 2 diabetes mellitus without complications: Secondary | ICD-10-CM

## 2013-01-29 DIAGNOSIS — IMO0001 Reserved for inherently not codable concepts without codable children: Secondary | ICD-10-CM

## 2013-01-29 LAB — LIPID PANEL
CHOL/HDL RATIO: 7
CHOLESTEROL: 217 mg/dL — AB (ref 0–200)
HDL: 31.8 mg/dL — AB (ref >39.00)
TRIGLYCERIDES: 585 mg/dL — AB (ref 0.0–149.0)
VLDL: 117 mg/dL — ABNORMAL HIGH (ref 0.0–40.0)

## 2013-01-29 LAB — BASIC METABOLIC PANEL
BUN: 12 mg/dL (ref 6–23)
CO2: 25 meq/L (ref 19–32)
Calcium: 9.2 mg/dL (ref 8.4–10.5)
Chloride: 98 mEq/L (ref 96–112)
Creatinine, Ser: 0.8 mg/dL (ref 0.4–1.2)
GFR: 80.01 mL/min (ref >60.00)
GLUCOSE: 408 mg/dL — AB (ref 70–99)
POTASSIUM: 4.3 meq/L (ref 3.5–5.1)
SODIUM: 134 meq/L — AB (ref 135–145)

## 2013-01-29 LAB — HEMOGLOBIN A1C: Hgb A1c MFr Bld: 13.1 % — ABNORMAL HIGH (ref 4.6–6.5)

## 2013-01-29 LAB — CBC WITH DIFFERENTIAL/PLATELET
BASOS ABS: 0 10*3/uL (ref 0.0–0.1)
Basophils Relative: 0.1 % (ref 0.0–3.0)
Eosinophils Absolute: 0.2 10*3/uL (ref 0.0–0.7)
Eosinophils Relative: 2.9 % (ref 0.0–5.0)
HCT: 41.8 % (ref 36.0–46.0)
Hemoglobin: 13.8 g/dL (ref 12.0–15.0)
LYMPHS ABS: 2.5 10*3/uL (ref 0.7–4.0)
Lymphocytes Relative: 46.1 % — ABNORMAL HIGH (ref 12.0–46.0)
MCHC: 32.9 g/dL (ref 30.0–36.0)
MCV: 84.9 fl (ref 78.0–100.0)
Monocytes Absolute: 0.4 10*3/uL (ref 0.1–1.0)
Monocytes Relative: 6.6 % (ref 3.0–12.0)
Neutro Abs: 2.5 10*3/uL (ref 1.4–7.7)
Neutrophils Relative %: 44.3 % (ref 43.0–77.0)
Platelets: 212 10*3/uL (ref 150.0–400.0)
RBC: 4.93 Mil/uL (ref 3.87–5.11)
RDW: 13.1 % (ref 11.5–14.6)
WBC: 5.5 10*3/uL (ref 4.5–10.5)

## 2013-01-29 LAB — TSH: TSH: 6.13 u[IU]/mL — ABNORMAL HIGH (ref 0.35–5.50)

## 2013-01-29 LAB — POCT URINALYSIS DIP (MANUAL ENTRY)
Bilirubin, UA: NEGATIVE
Glucose, UA: 250
Leukocytes, UA: NEGATIVE
Nitrite, UA: NEGATIVE
PH UA: 6
PROTEIN UA: NEGATIVE
RBC UA: NEGATIVE
SPEC GRAV UA: 1.01
Urobilinogen, UA: 0.2

## 2013-01-29 LAB — T4, FREE: Free T4: 0.46 ng/dL — ABNORMAL LOW (ref 0.60–1.60)

## 2013-01-29 LAB — GLUCOSE, POCT (MANUAL RESULT ENTRY): POC Glucose: 393 mg/dl — AB (ref 70–99)

## 2013-01-29 LAB — HEPATIC FUNCTION PANEL
ALBUMIN: 4.5 g/dL (ref 3.5–5.2)
ALT: 40 U/L — ABNORMAL HIGH (ref 0–35)
AST: 42 U/L — AB (ref 0–37)
Alkaline Phosphatase: 71 U/L (ref 39–117)
Bilirubin, Direct: 0.1 mg/dL (ref 0.0–0.3)
Total Bilirubin: 1 mg/dL (ref 0.3–1.2)
Total Protein: 8.1 g/dL (ref 6.0–8.3)

## 2013-01-29 MED ORDER — METFORMIN HCL ER 500 MG PO TB24: 500.0000 mg | ORAL_TABLET | Freq: Every day | ORAL | Status: DC

## 2013-01-29 NOTE — Progress Notes (Signed)
Chief Complaint  Patient presents with  . Urinary Frequency    HPI: Patient comes in today for SDA for  new problem evaluation. Last visit was 2012  Concern recnetly about UI  Onset  Dripping  Months ago and then started pads.  Then  About amonth ago got worse and in the last 2 weeks every 30 minutes  Every  2 hours  Fever chillsn blood   No pain .  Some stress and  Incontinence .  No meds except multivitamins.  LMP was  Over a year ago  Last pap smear was  3 years ago .  No fever some weight loss no change in vision. ROS: See pertinent positives and negatives per HPI. No dysuria abdominal pain. No chest pain shortness of breath does have fatigue. Is postmenopausal. Has a grand baby and husband is a truck driver has diabetes helps take care of everyone and works.  Has a history of gestational diabetes that was controlled with diet. Sprays to be on thyroid medicine but hasn't followed up no recent lab tests.  Past Medical History  Diagnosis Date  . Anemia     nos  . Anxiety   . Depression   . Hypothyroidism     Family History  Problem Relation Age of Onset  . Thyroid disease Mother   . Hypertension Mother   . Arthritis Mother   . Fibromyalgia Mother   . Breast cancer Mother   . Heart attack Father   . Breast cancer Sister   . Thyroid disease Sister     History   Social History  . Marital Status: Married    Spouse Name: N/A    Number of Children: N/A  . Years of Education: N/A   Social History Main Topics  . Smoking status: Never Smoker   . Smokeless tobacco: None  . Alcohol Use: No  . Drug Use: None  . Sexual Activity: None   Other Topics Concern  . 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 ferrous sulfate 325 (65 FE) MG tablet Take 325 mg by mouth daily with breakfast.    . glucose blood (ONETOUCH  VERIO) test strip 1 each by Other route as needed for other. Use as instructed  . insulin aspart (NOVOLOG FLEXPEN) 100 UNIT/ML FlexPen Inject 6 units. Use as instructed  . MULTIPLE VITAMIN PO Take 1 tablet by mouth daily.   Glory Rosebush DELICA LANCETS FINE MISC by Does not apply route. Use as instructed  . metFORMIN (GLUCOPHAGE-XR) 500 MG 24 hr tablet Take 1 tablet (500 mg total) by mouth daily with breakfast. Increase to  2 per day in 2 weeks  . [DISCONTINUED] benzocaine (ORAJEL) 10 % mucosal gel Use as directed 1 application in the mouth or throat as needed for pain.  . [DISCONTINUED] oxyCODONE-acetaminophen (PERCOCET/ROXICET) 5-325 MG per tablet Take 1 tablet by mouth every 6 (six) hours as needed for pain.    EXAM:  BP 130/80  Pulse 87  Temp(Src) 98.2 F (36.8 C) (Oral)  Wt 247 lb (112.038 kg)  SpO2 97%  Body mass index is 37.56 kg/(m^2).  GENERAL: vitals reviewed and listed above, alert, oriented, appears well hydrated and in no acute distress HEENT: atraumatic, conjunctiva  clear, no obvious abnormalities on inspection of external nose  and ears OP : no lesion edema or exudate  NECK: no obvious masses on inspection palpation  LUNGS: clear to auscultation bilaterally, no wheezes, rales or rhonchi, good air movement CV: HRRR, no clubbing cyanosis or  peripheral edema nl cap refill  Abdomen:  Sof,t normal bowel sounds without hepatosplenomegaly, no guarding rebound or masses no CVA tenderness MS: moves all extremities without noticeable focal  abnormality PSYCH: pleasant and cooperative, no obvious depression or anxiety  ASSESSMENT AND PLAN:  Discussed the following assessment and plan:  Diabetes type 2, uncontrolled - New onset discussed options see assessment  Diabetes mellitus, new onset - Plan: POC Glucose (CBG)  Urinary incontinence - Most likely from diabetes. - Plan: Basic metabolic panel, CBC with Differential, Hemoglobin A1c, Hepatic function panel, TSH, T4, free, POCT  urinalysis dipstick, Culture, Urine, Lipid panel  Lower urinary tract symptoms (LUTS) - Plan: Basic metabolic panel, CBC with Differential, Hemoglobin A1c, Hepatic function panel, TSH, T4, free, POCT urinalysis dipstick, Culture, Urine, Lipid panel  HYPOTHYROIDISM - Plan: Basic metabolic panel, CBC with Differential, Hemoglobin A1c, Hepatic function panel, TSH, T4, free, POCT urinalysis dipstick, Culture, Urine, Lipid panel Urinalysis shows no infection 4+ leukosis random blood sugar in the 300s. 6 units NovoLog given in clinic Discussed implications patient is aware of diabetes as her husband is such but hasn't checked her blood sugar Discussed metformin and insulin prefers no meds but informed that control was the first step and then readjustment. Avoiding sugar drinks cranberry juice dietary thing she is aware of.  Begin self-monitoring 2 times a day fasting and one other time sample of NovoLog given to give 6 units if over 250  Begin metformin 500 increased to 1000 the day followup in 2 weeks Sign out for my chart  to help with monitoring. Follow up in 2 weeks. Reported that low-dose basal insulin plus metformin may be the right combination for best control however we can try the metformin and as needed short acting insulin in the short run.  Patient has not had regular medical care for the last few years.  delayed on mammogram Pap due this past year a number of other health care parameters Is not on thyroid replacement at this time we'll await readings. -Patient advised to return or notify health care team  if symptoms worsen or persist or new concerns arise.  Patient Instructions  Will notify you  of labs when available. And fu check for infection.  Etc .  Metformin  May not be enough today   May need to use low dose insulin also .  Check bg twice a day. ROV in 2 weeks  -4 weeks       Urinary Frequency The number of times a normal person urinates depends upon how much liquid they  take in and how much liquid they are losing. If the temperature is hot and there is high humidity then the person will sweat more and usually breathe a little more frequently. These factors decrease the amount of frequency of urination that would be considered normal. The amount you drink is easily determined, but the amount of fluid lost is sometimes more difficult to calculate.  Fluid is lost in two ways:  Sensible fluid loss is usually measured by the amount of urine that you get rid of. Losses of fluid can also occur with diarrhea.  Insensible fluid loss is more difficult to measure. It is caused by evaporation. Insensible loss of fluid occurs through breathing and sweating. It usually  ranges from a little less than a quart to a little more than a quart of fluid a day. In normal temperatures and activity levels the average person may urinate 4 to 7 times in a 24-hour period. Needing to urinate more often than that could indicate a problem. If one urinates 4 to 7 times in 24 hours and has large volumes each time, that could indicate a different problem from one who urinates 4 to 7 times a day and has small volumes. The time of urinating is also an important. Most urinating should be done during the waking hours. Getting up at night to urinate frequently can indicate some problems. CAUSES  The bladder is the organ in your lower abdomen that holds urine. Like a balloon, it swells some as it fills up. Your nerves sense this and tell you it is time to head for the bathroom. There are a number of reasons that you might feel the need to urinate more often than usual. They include:  Urinary tract infection. This is usually associated with other signs such as burning when you urinate.  In men, problems with the prostate (a walnut-size gland that is located near the tube that carries urine out of your body). There are two reasons why the prostate can cause an increased frequency of urination:  An enlarged  prostate that does not let the bladder empty well. If the bladder only half empties when you urinate then it only has half the capacity to fill before you have to urinate again.  The nerves in the bladder become more hypersensitive with an increased size of the prostate even if the bladder empties completely.  Pregnancy.  Obesity. Excess weight is more likely to cause a problem for women more than for men.  Bladder stones or other bladder problems.  Caffeine.  Alcohol.  Medications. For example, drugs that help the body get rid of extra fluid (diuretics) increase urine production. Some other medicines must be taken with lots of fluids.  Muscle or nerve weakness. This might be the result of a spinal cord injury, a stroke, multiple sclerosis or Parkinson's disease.  Long-standing diabetes can decrease the sensation of the bladder. This loss of sensation makes it harder to sense the bladder needs to be emptied. Over a period of years the bladder is stretched out by constant overfilling. This weakens the bladder muscles so that the bladder does not empty well and has less capacity to fill with new urine.  Interstitial cystitis (also called painful bladder syndrome). This condition develops because the tissues that line the insider of the bladder are inflamed (inflammation is the body's way of reacting to injury or infection). It causes pain and frequent urination. It occurs in women more often than in men. DIAGNOSIS   To decide what might be causing your urinary frequency, your healthcare provider will probably:  Ask about symptoms you have noticed.  Ask about your overall health. This will include questions about any medications you are taking.  Do a physical examination.  Order some tests. These might include:  A blood test to check for diabetes or other health issues that could be contributing to the problem.  Urine testing. This could measure the flow of urine and the pressure on  the bladder.  A test of your neurological system (the brain, spinal cord and nerves). This is the system that senses the need to urinate.  A bladder test to check whether it is emptying completely when you urinate.  Cytoscopy. This test uses a thin tube with a tiny camera on it. It offers a look inside your urethra and bladder to see if there are problems.  Imaging tests. You might be given a contrast dye and then asked to urinate. X-rays are taken to see how your bladder is working. TREATMENT  It is important for you to be evaluated to determine if the amount or frequency that you have is unusual or abnormal. If it is found to be abnormal the cause should be determined and this can usually be found out easily. Depending upon the cause treatment could include medication, stimulation of the nerves, or surgery. There are not too many things that you can do as an individual to change your urinary frequency. It is important that you balance the amount of fluid intake needed to compensate for your activity and the temperature. Medical problems will be diagnosed and taken care of by your physician. There is no particular bladder training such as Kegel's exercises that you can do to help urinary frequency. This is an exercise this is usually done for people who have leaking of urine when they laugh cough or sneeze. HOME CARE INSTRUCTIONS   Take any medications your healthcare provider prescribed or suggested. Follow the directions carefully.  Practice any lifestyle changes that are recommended. These might include:  Drinking less fluid or drinking at different times of the day. If you need to urinate often during the night, for example, you may need to stop drinking fluids early in the evening.  Cutting down on caffeine or alcohol. They both can make you need to urinate more often than normal. Caffeine is found in coffee, tea and sodas.  Losing weight, if that is recommended.  Keep a journal or a  log. You might be asked to record how much you drink and when and when you feel the need to urinate. This will also help evaluate how well the treatment provided by your physician is working. SEEK MEDICAL CARE IF:   Your need to urinate often gets worse.  You feel increased pain or irritation when you urinate.  You notice blood in your urine.  You have questions about any medications that your healthcare provider recommended.  You notice blood, pus or swelling at the site of any test or treatment procedure.  You develop a fever of more than 100.5 F (38.1 C). SEEK IMMEDIATE MEDICAL CARE IF:  You develop a fever of more than 102.0 F (38.9 C). Document Released: 11/07/2008 Document Revised: 04/05/2011 Document Reviewed: 11/07/2008 [facility] Patient Information 2014 [place].      Standley Brooking. Elley Harp M.D.  Prolonged visit  Total visit 89mins > 50% spent counseling and coordinating care

## 2013-01-29 NOTE — Patient Instructions (Addendum)
Will notify you  of labs when available. And fu check for infection.  Etc .  Metformin  May not be enough today   May need to use low dose insulin also .  Check bg twice a day. ROV in 2 weeks  -4 weeks       Urinary Frequency The number of times a normal person urinates depends upon how much liquid they take in and how much liquid they are losing. If the temperature is hot and there is high humidity then the person will sweat more and usually breathe a little more frequently. These factors decrease the amount of frequency of urination that would be considered normal. The amount you drink is easily determined, but the amount of fluid lost is sometimes more difficult to calculate.  Fluid is lost in two ways:  Sensible fluid loss is usually measured by the amount of urine that you get rid of. Losses of fluid can also occur with diarrhea.  Insensible fluid loss is more difficult to measure. It is caused by evaporation. Insensible loss of fluid occurs through breathing and sweating. It usually ranges from a little less than a quart to a little more than a quart of fluid a day. In normal temperatures and activity levels the average person may urinate 4 to 7 times in a 24-hour period. Needing to urinate more often than that could indicate a problem. If one urinates 4 to 7 times in 24 hours and has large volumes each time, that could indicate a different problem from one who urinates 4 to 7 times a day and has small volumes. The time of urinating is also an important. Most urinating should be done during the waking hours. Getting up at night to urinate frequently can indicate some problems. CAUSES  The bladder is the organ in your lower abdomen that holds urine. Like a balloon, it swells some as it fills up. Your nerves sense this and tell you it is time to head for the bathroom. There are a number of reasons that you might feel the need to urinate more often than usual. They include:  Urinary tract  infection. This is usually associated with other signs such as burning when you urinate.  In men, problems with the prostate (a walnut-size gland that is located near the tube that carries urine out of your body). There are two reasons why the prostate can cause an increased frequency of urination:  An enlarged prostate that does not let the bladder empty well. If the bladder only half empties when you urinate then it only has half the capacity to fill before you have to urinate again.  The nerves in the bladder become more hypersensitive with an increased size of the prostate even if the bladder empties completely.  Pregnancy.  Obesity. Excess weight is more likely to cause a problem for women more than for men.  Bladder stones or other bladder problems.  Caffeine.  Alcohol.  Medications. For example, drugs that help the body get rid of extra fluid (diuretics) increase urine production. Some other medicines must be taken with lots of fluids.  Muscle or nerve weakness. This might be the result of a spinal cord injury, a stroke, multiple sclerosis or Parkinson's disease.  Long-standing diabetes can decrease the sensation of the bladder. This loss of sensation makes it harder to sense the bladder needs to be emptied. Over a period of years the bladder is stretched out by constant overfilling. This weakens the bladder muscles  so that the bladder does not empty well and has less capacity to fill with new urine.  Interstitial cystitis (also called painful bladder syndrome). This condition develops because the tissues that line the insider of the bladder are inflamed (inflammation is the body's way of reacting to injury or infection). It causes pain and frequent urination. It occurs in women more often than in men. DIAGNOSIS   To decide what might be causing your urinary frequency, your healthcare provider will probably:  Ask about symptoms you have noticed.  Ask about your overall health.  This will include questions about any medications you are taking.  Do a physical examination.  Order some tests. These might include:  A blood test to check for diabetes or other health issues that could be contributing to the problem.  Urine testing. This could measure the flow of urine and the pressure on the bladder.  A test of your neurological system (the brain, spinal cord and nerves). This is the system that senses the need to urinate.  A bladder test to check whether it is emptying completely when you urinate.  Cytoscopy. This test uses a thin tube with a tiny camera on it. It offers a look inside your urethra and bladder to see if there are problems.  Imaging tests. You might be given a contrast dye and then asked to urinate. X-rays are taken to see how your bladder is working. TREATMENT  It is important for you to be evaluated to determine if the amount or frequency that you have is unusual or abnormal. If it is found to be abnormal the cause should be determined and this can usually be found out easily. Depending upon the cause treatment could include medication, stimulation of the nerves, or surgery. There are not too many things that you can do as an individual to change your urinary frequency. It is important that you balance the amount of fluid intake needed to compensate for your activity and the temperature. Medical problems will be diagnosed and taken care of by your physician. There is no particular bladder training such as Kegel's exercises that you can do to help urinary frequency. This is an exercise this is usually done for people who have leaking of urine when they laugh cough or sneeze. HOME CARE INSTRUCTIONS   Take any medications your healthcare provider prescribed or suggested. Follow the directions carefully.  Practice any lifestyle changes that are recommended. These might include:  Drinking less fluid or drinking at different times of the day. If you need to  urinate often during the night, for example, you may need to stop drinking fluids early in the evening.  Cutting down on caffeine or alcohol. They both can make you need to urinate more often than normal. Caffeine is found in coffee, tea and sodas.  Losing weight, if that is recommended.  Keep a journal or a log. You might be asked to record how much you drink and when and when you feel the need to urinate. This will also help evaluate how well the treatment provided by your physician is working. SEEK MEDICAL CARE IF:   Your need to urinate often gets worse.  You feel increased pain or irritation when you urinate.  You notice blood in your urine.  You have questions about any medications that your healthcare provider recommended.  You notice blood, pus or swelling at the site of any test or treatment procedure.  You develop a fever of more than 100.5 F (  38.1 C). SEEK IMMEDIATE MEDICAL CARE IF:  You develop a fever of more than 102.0 F (38.9 C). Document Released: 11/07/2008 Document Revised: 04/05/2011 Document Reviewed: 11/07/2008 [facility] Patient Information 2014 [place].

## 2013-01-29 NOTE — Progress Notes (Signed)
Pre visit review using our clinic review tool, if applicable. No additional management support is needed unless otherwise documented below in the visit note. 

## 2013-01-30 LAB — LDL CHOLESTEROL, DIRECT: Direct LDL: 100.6 mg/dL

## 2013-01-31 LAB — URINE CULTURE
Colony Count: NO GROWTH
Organism ID, Bacteria: NO GROWTH

## 2013-02-02 ENCOUNTER — Other Ambulatory Visit: Payer: Self-pay | Admitting: Family Medicine

## 2013-02-02 MED ORDER — INSULIN DETEMIR 100 UNIT/ML FLEXPEN: 10.0000 [IU] | Freq: Every day | SUBCUTANEOUS | Status: DC

## 2013-02-02 MED ORDER — LEVOTHYROXINE SODIUM 50 MCG PO TABS: 50.0000 ug | ORAL_TABLET | Freq: Every day | ORAL | Status: DC

## 2013-02-13 ENCOUNTER — Ambulatory Visit (INDEPENDENT_AMBULATORY_CARE_PROVIDER_SITE_OTHER): Payer: BC Managed Care – PPO | Admitting: Internal Medicine

## 2013-02-13 ENCOUNTER — Encounter: Payer: Self-pay | Admitting: Internal Medicine

## 2013-02-13 VITALS — BP 110/80 | Temp 98.0°F | Ht 68.0 in | Wt 247.0 lb

## 2013-02-13 DIAGNOSIS — E1165 Type 2 diabetes mellitus with hyperglycemia: Secondary | ICD-10-CM

## 2013-02-13 DIAGNOSIS — E039 Hypothyroidism, unspecified: Secondary | ICD-10-CM

## 2013-02-13 DIAGNOSIS — IMO0002 Reserved for concepts with insufficient information to code with codable children: Secondary | ICD-10-CM

## 2013-02-13 DIAGNOSIS — E785 Hyperlipidemia, unspecified: Secondary | ICD-10-CM

## 2013-02-13 DIAGNOSIS — IMO0001 Reserved for inherently not codable concepts without codable children: Secondary | ICD-10-CM

## 2013-02-13 DIAGNOSIS — O99810 Abnormal glucose complicating pregnancy: Secondary | ICD-10-CM

## 2013-02-13 LAB — GLUCOSE, POCT (MANUAL RESULT ENTRY): POC Glucose: 126 mg/dl — AB (ref 70–99)

## 2013-02-13 MED ORDER — METFORMIN HCL ER 500 MG PO TB24: 500.0000 mg | ORAL_TABLET | Freq: Every day | ORAL | Status: DC

## 2013-02-13 NOTE — Progress Notes (Signed)
Chief Complaint  Patient presents with  . Follow-up    HPI: FU new onset dm with severe hyperglycemia  DM symptomatic LUTsx . In am is a bit high  216 adn in pm below  In after Only  Since last visit  Basal insulin  Too expensive because has a height adoptable Using novalog if over 300 mg .  hanst needed to use this for a few days Taken for over 300 range .  Metformin  Only once a day  hasnt started exercise yet.  More energetic only nocturia x 1 and more energuy.  Adding chromiaum . To vitamin list. Has severely changed her diet no sugars no simple carbs basically. Feels a lot better has more energy in the day. Polyuria is significantly decreased  Had soup chiken breast and beans for lunch. Is also started back on her thyroid replacement. ROS: See pertinent positives and negatives per HPI. Current chest pain shortness of breath or numbness.  Past Medical History  Diagnosis Date  . Anemia     nos  . Anxiety   . Depression   . Hypothyroidism     Family History  Problem Relation Age of Onset  . Thyroid disease Mother   . Hypertension Mother   . Arthritis Mother   . Fibromyalgia Mother   . Breast cancer Mother   . Heart attack Father   . Breast cancer Sister   . Thyroid disease Sister     History   Social History  . Marital Status: Married    Spouse Name: N/A    Number of Children: N/A  . Years of Education: N/A   Social History Main Topics  . Smoking status: Never Smoker   . Smokeless tobacco: None  . Alcohol Use: No  . Drug Use: None  . Sexual Activity: None   Other Topics Concern  . 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 ferrous sulfate 325 (65 FE) MG tablet Take 325 mg by mouth daily with breakfast.    . glucose blood (ONETOUCH VERIO) test strip 1 each by Other route as needed for other. Use as  instructed  . insulin aspart (NOVOLOG FLEXPEN) 100 UNIT/ML FlexPen Inject 6 units. Use as instructed  . levothyroxine (SYNTHROID, LEVOTHROID) 50 MCG tablet Take 1 tablet (50 mcg total) by mouth daily.  . metFORMIN (GLUCOPHAGE-XR) 500 MG 24 hr tablet Take 1 tablet (500 mg total) by mouth daily with breakfast. Increase to  2 per day in 2 weeks and then 3 per day in  4 weeks or as directed  . MULTIPLE VITAMIN PO Take 1 tablet by mouth daily.   Glory Rosebush DELICA LANCETS FINE MISC by Does not apply route. Use as instructed  . [DISCONTINUED] metFORMIN (GLUCOPHAGE-XR) 500 MG 24 hr tablet Take 1 tablet (500 mg total) by mouth daily with breakfast. Increase to  2 per day in 2 weeks  . [DISCONTINUED] insulin detemir (LEVEMIR) 100 unit/ml SOLN Inject 10 Units into the skin daily.    EXAM:  BP 110/80  Temp(Src) 98 F (36.7 C) (Oral)  Ht 5\' 8"  (1.727 m)  Wt 247 lb (112.038 kg)  BMI 37.56 kg/m2  Body mass index is 37.56 kg/(m^2). Wt Readings from Last 3 Encounters:  02/13/13 247 lb (112.038 kg)  01/29/13  247 lb (112.038 kg)  10/24/12 262 lb (118.842 kg)    GENERAL: vitals reviewed and listed above, alert, oriented, appears well hydrated and in no acute distress  HEENT: atraumatic, conjunctiva  clear, no obvious abnormalities on inspection of external nose and ears OP : no lesion edema or exudate  PSYCH: pleasant and cooperative, no obvious depression or anxiety Wt Readings from Last 3 Encounters:  02/13/13 247 lb (112.038 kg)  01/29/13 247 lb (112.038 kg)  10/24/12 262 lb (118.842 kg)   Postprandial CBG   126! ASSESSMENT AND PLAN:  Discussed the following assessment and plan:  Diabetes type 2, uncontrolled - Significant improvement with minimal insulin use ;500 metformin significant dietary changes.  DIABETES MELLITUS, GESTATIONAL - Plan: POCT Glucose (CBG)  Unspecified hypothyroidism  Other and unspecified hyperlipidemia - Reassess after blood sugar controlled eventually on  statin. Pleasantly surprised at the significant improvement in her blood sugars based on her readings and hours. Will increase her metformin as possible as her fastings tend to be higher. She will be using her NovoLog for coverage. Consider other options besides metformin if not controlled over time. Basal insulin but still be the best for some control  and cost may be an issue. For now she will continue intensive lifestyle changes she is motivated. We'll address lipid thyroid etc. at her followup visits. For now we will hold on diabetes nutrition referral as she would like to research some more before going that route to formulate questions and problem solving. Her husband is diabetic so she has some background in view choices blood glucose monitoring and medications. He is not on injectables but is controlled on oral medicines. Another sample of the NovoLog flex pen given if needed. -Patient advised to return or notify health care team  if symptoms worsen or persist or new concerns arise.  Patient Instructions   Increase  Metformin to 1000 mg per day and then  1500 mg  After 2 weeks  ( can split the dose)   For now ok to  Continue of   As needed short acting insulin.   Lab Results  Component Value Date   WBC 5.5 01/29/2013   HGB 13.8 01/29/2013   HCT 41.8 01/29/2013   PLT 212.0 01/29/2013   GLUCOSE 408* 01/29/2013   CHOL 217* 01/29/2013   TRIG 585.0* 01/29/2013   HDL 31.80* 01/29/2013   LDLDIRECT 100.6 01/29/2013   LDLCALC 120* 04/27/2007   ALT 40* 01/29/2013   AST 42* 01/29/2013   NA 134* 01/29/2013   K 4.3 01/29/2013   CL 98 01/29/2013   CREATININE 0.8 01/29/2013   BUN 12 01/29/2013   CO2 25 01/29/2013   TSH 6.13* 01/29/2013   HGBA1C 13.1* 01/29/2013      Shakemia Madera K. Felise Georgia M.D.  Pre visit review using our clinic review tool, if applicable. No additional management support is needed unless otherwise documented below in the visit note.

## 2013-02-13 NOTE — Patient Instructions (Addendum)
Increase  Metformin to 1000 mg per day and then  1500 mg  After 2 weeks  ( can split the dose)   For now ok to  Continue of   As needed short acting insulin.   Lab Results  Component Value Date   WBC 5.5 01/29/2013   HGB 13.8 01/29/2013   HCT 41.8 01/29/2013   PLT 212.0 01/29/2013   GLUCOSE 408* 01/29/2013   CHOL 217* 01/29/2013   TRIG 585.0* 01/29/2013   HDL 31.80* 01/29/2013   LDLDIRECT 100.6 01/29/2013   LDLCALC 120* 04/27/2007   ALT 40* 01/29/2013   AST 42* 01/29/2013   NA 134* 01/29/2013   K 4.3 01/29/2013   CL 98 01/29/2013   CREATININE 0.8 01/29/2013   BUN 12 01/29/2013   CO2 25 01/29/2013   TSH 6.13* 01/29/2013   HGBA1C 13.1* 01/29/2013

## 2013-03-19 ENCOUNTER — Ambulatory Visit (INDEPENDENT_AMBULATORY_CARE_PROVIDER_SITE_OTHER): Payer: BC Managed Care – PPO | Admitting: Internal Medicine

## 2013-03-19 ENCOUNTER — Encounter: Payer: Self-pay | Admitting: Internal Medicine

## 2013-03-19 VITALS — BP 120/80 | HR 72 | Temp 98.1°F | Ht 68.0 in | Wt 247.0 lb

## 2013-03-19 DIAGNOSIS — E039 Hypothyroidism, unspecified: Secondary | ICD-10-CM

## 2013-03-19 DIAGNOSIS — E782 Mixed hyperlipidemia: Secondary | ICD-10-CM | POA: Insufficient documentation

## 2013-03-19 DIAGNOSIS — IMO0002 Reserved for concepts with insufficient information to code with codable children: Secondary | ICD-10-CM | POA: Insufficient documentation

## 2013-03-19 DIAGNOSIS — IMO0001 Reserved for inherently not codable concepts without codable children: Secondary | ICD-10-CM

## 2013-03-19 DIAGNOSIS — E1165 Type 2 diabetes mellitus with hyperglycemia: Secondary | ICD-10-CM

## 2013-03-19 DIAGNOSIS — E785 Hyperlipidemia, unspecified: Secondary | ICD-10-CM | POA: Insufficient documentation

## 2013-03-19 MED ORDER — METFORMIN HCL ER 500 MG PO TB24
500.0000 mg | ORAL_TABLET | Freq: Every day | ORAL | Status: DC
Start: 2013-03-19 — End: 2014-04-22

## 2013-03-19 NOTE — Assessment & Plan Note (Signed)
imporve sx increase metformin  Will go to 1000 mg per day .

## 2013-03-19 NOTE — Assessment & Plan Note (Signed)
Pt doesn't want to take statin cause of experience with husband having severe se  Will follow and discuss   Taking FO?  Will readdress late

## 2013-03-19 NOTE — Progress Notes (Signed)
Chief Complaint  Patient presents with  . Follow-up  . Diabetes    HPI: FU new onset dm   Averaging  150   At this time fbs this am was 168   White bread   Shoots up  Sugars.  98 - 168  Has only used the add on IR insulin once  Since last visit   Vision better   Urination like normal.   Didn't take the 2 metformin per day  Concern about se as her husband had a bad bout with medication se in psat years. ik now mostly from statin.  Taking thyroid medication daily.  Feels pretty well.  ROS: See pertinent positives and negatives per HPI. No cp sob numbness or falling  Past Medical History  Diagnosis Date  . Anemia     nos  . Anxiety   . Depression   . Hypothyroidism     Family History  Problem Relation Age of Onset  . Thyroid disease Mother   . Hypertension Mother   . Arthritis Mother   . Fibromyalgia Mother   . Breast cancer Mother   . Heart attack Father   . Breast cancer Sister   . Thyroid disease Sister     History   Social History  . Marital Status: Married    Spouse Name: N/A    Number of Children: N/A  . Years of Education: N/A   Social History Main Topics  . Smoking status: Never Smoker   . Smokeless tobacco: None  . Alcohol Use: No  . Drug Use: None  . Sexual Activity: None   Other Topics Concern  . 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 ferrous sulfate 325 (65 FE) MG tablet Take 325 mg by mouth daily with breakfast.    . glucose blood (ONETOUCH VERIO) test strip 1 each by Other route as needed for other. Use as instructed  . levothyroxine (SYNTHROID, LEVOTHROID) 50 MCG tablet Take 1 tablet (50 mcg total) by mouth daily.  . metFORMIN (GLUCOPHAGE-XR) 500 MG 24 hr tablet Take 1 tablet (500 mg total) by mouth daily with breakfast. Increase to  2 per day in 2 weeks and then 3 per day  in  4 weeks or as directed  . MULTIPLE VITAMIN PO Take 1 tablet by mouth daily.   Glory Rosebush DELICA LANCETS FINE MISC by Does not apply route. Use as instructed  . [DISCONTINUED] metFORMIN (GLUCOPHAGE-XR) 500 MG 24 hr tablet Take 1 tablet (500 mg total) by mouth daily with breakfast. Increase to  2 per day in 2 weeks and then 3 per day in  4 weeks or as directed  . insulin aspart (NOVOLOG FLEXPEN) 100 UNIT/ML FlexPen Inject 6 units. Use as instructed    EXAM:  BP 120/80  Pulse 72  Temp(Src) 98.1 F (36.7 C) (Oral)  Ht 5\' 8"  (1.727 m)  Wt 247 lb (112.038 kg)  BMI 37.56 kg/m2  SpO2 98%  Body mass index is 37.56 kg/(m^2).  GENERAL: vitals reviewed and listed above, alert, oriented, appears well hydrated and in no acute distress HEENT: atraumatic, conjunctiva  clear, no obvious abnormalities on inspection of external nose and ears MS: moves all extremities without noticeable focal  abnormality PSYCH: pleasant and cooperative, no obvious depression or anxiety  Lab Results  Component Value Date   WBC 5.5 01/29/2013   HGB 13.8 01/29/2013   HCT 41.8 01/29/2013   PLT 212.0 01/29/2013   GLUCOSE 408* 01/29/2013   CHOL 217* 01/29/2013   TRIG 585.0* 01/29/2013   HDL 31.80* 01/29/2013   LDLDIRECT 100.6 01/29/2013   LDLCALC 120* 04/27/2007   ALT 40* 01/29/2013   AST 42* 01/29/2013   NA 134* 01/29/2013   K 4.3 01/29/2013   CL 98 01/29/2013   CREATININE 0.8 01/29/2013   BUN 12 01/29/2013   CO2 25 01/29/2013   TSH 6.13* 01/29/2013   HGBA1C 13.1* 01/29/2013    ASSESSMENT AND PLAN:  Discussed the following assessment and plan:  Diabetes type 2, uncontrolled  Other and unspecified hyperlipidemia  Unspecified hypothyroidism Reviewed risk-benefit and barriers to following through with this clinician's recommendations. She has great hesitations and cautions with medicines and concern about side effect discussed a more of a side effect if the diabetes is uncontrolled. Will have a hard time getting her to take a statin  medicine she is taking fish oil this time will discuss this later. Labs in one month and followup in 2-3 months. Reiterated begin early control do things more favorable profile if she wants to be off medication uncertain how realistic this is although she has brought her sugars down a good bit. -Patient advised to return or notify health care team  if symptoms worsen or persist or new concerns arise.  Patient Instructions  fastings  Should be   120 or below goal.  And after eating 160 or below.  increase the metformin to at least 2 per day.  Statin medication have the tract record for prevention of heart attack and stroke.  Labs   In 1 month and ROV depending on the results.  Poss 2-3 months     Standley Brooking. Panosh M.D. Total visit 95mins > 50% spent counseling and coordinating care   Pre visit review using our clinic review tool, if applicable. No additional management support is needed unless otherwise documented below in the visit note.

## 2013-03-19 NOTE — Patient Instructions (Addendum)
fastings  Should be   120 or below goal.  And after eating 160 or below.  increase the metformin to at least 2 per day.  Statin medication have the tract record for prevention of heart attack and stroke.  Labs   In 1 month and ROV depending on the results.  Poss 2-3 months

## 2013-05-04 ENCOUNTER — Other Ambulatory Visit: Payer: Self-pay | Admitting: Family Medicine

## 2013-05-04 DIAGNOSIS — E785 Hyperlipidemia, unspecified: Secondary | ICD-10-CM

## 2013-05-04 DIAGNOSIS — E119 Type 2 diabetes mellitus without complications: Secondary | ICD-10-CM

## 2013-05-04 DIAGNOSIS — E039 Hypothyroidism, unspecified: Secondary | ICD-10-CM

## 2013-05-04 DIAGNOSIS — R945 Abnormal results of liver function studies: Secondary | ICD-10-CM

## 2013-05-07 ENCOUNTER — Other Ambulatory Visit (INDEPENDENT_AMBULATORY_CARE_PROVIDER_SITE_OTHER): Payer: BC Managed Care – PPO

## 2013-05-07 DIAGNOSIS — E785 Hyperlipidemia, unspecified: Secondary | ICD-10-CM

## 2013-05-07 DIAGNOSIS — E119 Type 2 diabetes mellitus without complications: Secondary | ICD-10-CM

## 2013-05-07 DIAGNOSIS — E039 Hypothyroidism, unspecified: Secondary | ICD-10-CM

## 2013-05-07 DIAGNOSIS — R945 Abnormal results of liver function studies: Secondary | ICD-10-CM

## 2013-05-07 LAB — LIPID PANEL
CHOL/HDL RATIO: 4
Cholesterol: 184 mg/dL (ref 0–200)
HDL: 46.5 mg/dL (ref >39.00)
LDL Cholesterol: 113 mg/dL — ABNORMAL HIGH (ref 0–99)
Triglycerides: 125 mg/dL (ref 0.0–149.0)
VLDL: 25 mg/dL (ref 0.0–40.0)

## 2013-05-07 LAB — HEMOGLOBIN A1C: Hgb A1c MFr Bld: 7.7 % — ABNORMAL HIGH (ref 4.6–6.5)

## 2013-05-07 LAB — HEPATIC FUNCTION PANEL
ALBUMIN: 4 g/dL (ref 3.5–5.2)
ALK PHOS: 53 U/L (ref 39–117)
ALT: 18 U/L (ref 0–35)
AST: 19 U/L (ref 0–37)
BILIRUBIN DIRECT: 0 mg/dL (ref 0.0–0.3)
Total Bilirubin: 0.8 mg/dL (ref 0.3–1.2)
Total Protein: 7.6 g/dL (ref 6.0–8.3)

## 2013-05-07 LAB — TSH: TSH: 6.79 u[IU]/mL — ABNORMAL HIGH (ref 0.35–5.50)

## 2013-05-16 ENCOUNTER — Other Ambulatory Visit: Payer: Self-pay | Admitting: Family Medicine

## 2013-05-16 MED ORDER — LEVOTHYROXINE SODIUM 75 MCG PO TABS: 75.0000 ug | ORAL_TABLET | Freq: Every day | ORAL | Status: DC

## 2013-09-04 ENCOUNTER — Telehealth: Payer: Self-pay

## 2013-09-04 NOTE — Telephone Encounter (Signed)
Diabetic Bundle- called and LM for pt to call back and schedule f/u appt regarding diabetes A1c, microalbumin and bmet need to be added when appt is made

## 2014-04-22 ENCOUNTER — Other Ambulatory Visit: Payer: Self-pay | Admitting: Family Medicine

## 2014-04-23 ENCOUNTER — Other Ambulatory Visit: Payer: Self-pay | Admitting: Family Medicine

## 2014-04-23 ENCOUNTER — Telehealth: Payer: Self-pay | Admitting: Family Medicine

## 2014-04-23 DIAGNOSIS — Z0000 Encounter for general adult medical examination without abnormal findings: Secondary | ICD-10-CM

## 2014-04-23 DIAGNOSIS — E119 Type 2 diabetes mellitus without complications: Secondary | ICD-10-CM

## 2014-04-23 MED ORDER — METFORMIN HCL ER 500 MG PO TB24: 500.0000 mg | ORAL_TABLET | Freq: Every day | ORAL | Status: DC

## 2014-04-23 NOTE — Telephone Encounter (Signed)
NEEDS LABS and OV  Ok to refill x 2 months  worth

## 2014-04-23 NOTE — Telephone Encounter (Signed)
Pt is due for lab work and a 30 minute office visit.  I have placed the orders for lab work.  Please help the pt make the appointments.  Thanks!

## 2014-04-23 NOTE — Telephone Encounter (Signed)
Sent to the pharmacy for 2 months.  Will send a message to scheduling to help her make appointments.

## 2014-04-25 NOTE — Telephone Encounter (Signed)
Pt has been sch

## 2014-07-07 ENCOUNTER — Other Ambulatory Visit: Payer: Self-pay | Admitting: Internal Medicine

## 2014-07-08 NOTE — Telephone Encounter (Signed)
Pt did not return for lab work or follow up.  Please advise.  Thanks!

## 2014-07-10 ENCOUNTER — Other Ambulatory Visit: Payer: Self-pay

## 2014-07-11 NOTE — Telephone Encounter (Signed)
Sent to the pharmacy by e-scribe. 

## 2014-07-11 NOTE — Telephone Encounter (Signed)
07/17/2014 Has appt coming up refill x 1 month worth

## 2014-07-17 ENCOUNTER — Encounter: Payer: Self-pay | Admitting: Internal Medicine

## 2014-07-17 DIAGNOSIS — Z0289 Encounter for other administrative examinations: Secondary | ICD-10-CM

## 2014-07-17 NOTE — Progress Notes (Signed)
Document opened and reviewed for OV but appt  NS same day .   

## 2014-08-02 ENCOUNTER — Ambulatory Visit (INDEPENDENT_AMBULATORY_CARE_PROVIDER_SITE_OTHER): Payer: PRIVATE HEALTH INSURANCE | Admitting: Internal Medicine

## 2014-08-02 ENCOUNTER — Other Ambulatory Visit: Payer: Self-pay | Admitting: Internal Medicine

## 2014-08-02 ENCOUNTER — Encounter: Payer: Self-pay | Admitting: Internal Medicine

## 2014-08-02 VITALS — BP 136/84 | Temp 98.7°F | Ht 68.0 in | Wt 257.6 lb

## 2014-08-02 DIAGNOSIS — Z2821 Immunization not carried out because of patient refusal: Secondary | ICD-10-CM

## 2014-08-02 DIAGNOSIS — E1165 Type 2 diabetes mellitus with hyperglycemia: Secondary | ICD-10-CM

## 2014-08-02 DIAGNOSIS — E785 Hyperlipidemia, unspecified: Secondary | ICD-10-CM | POA: Diagnosis not present

## 2014-08-02 DIAGNOSIS — E039 Hypothyroidism, unspecified: Secondary | ICD-10-CM | POA: Diagnosis not present

## 2014-08-02 DIAGNOSIS — IMO0002 Reserved for concepts with insufficient information to code with codable children: Secondary | ICD-10-CM

## 2014-08-02 LAB — CBC WITH DIFFERENTIAL/PLATELET
BASOS PCT: 0.3 % (ref 0.0–3.0)
Basophils Absolute: 0 10*3/uL (ref 0.0–0.1)
EOS ABS: 0.2 10*3/uL (ref 0.0–0.7)
EOS PCT: 3.3 % (ref 0.0–5.0)
HCT: 39.9 % (ref 36.0–46.0)
HEMOGLOBIN: 12.8 g/dL (ref 12.0–15.0)
LYMPHS ABS: 2.2 10*3/uL (ref 0.7–4.0)
Lymphocytes Relative: 39.7 % (ref 12.0–46.0)
MCHC: 32 g/dL (ref 30.0–36.0)
MCV: 82.3 fl (ref 78.0–100.0)
MONO ABS: 0.4 10*3/uL (ref 0.1–1.0)
Monocytes Relative: 6.8 % (ref 3.0–12.0)
NEUTROS ABS: 2.7 10*3/uL (ref 1.4–7.7)
Neutrophils Relative %: 49.9 % (ref 43.0–77.0)
Platelets: 252 10*3/uL (ref 150.0–400.0)
RBC: 4.85 Mil/uL (ref 3.87–5.11)
RDW: 15.9 % — ABNORMAL HIGH (ref 11.5–15.5)
WBC: 5.4 10*3/uL (ref 4.0–10.5)

## 2014-08-02 LAB — LIPID PANEL
CHOL/HDL RATIO: 5
Cholesterol: 191 mg/dL (ref 0–200)
HDL: 40.3 mg/dL (ref >39.00)
LDL Cholesterol: 111 mg/dL — ABNORMAL HIGH (ref 0–99)
NonHDL: 150.7
Triglycerides: 200 mg/dL — ABNORMAL HIGH (ref 0.0–149.0)
VLDL: 40 mg/dL (ref 0.0–40.0)

## 2014-08-02 LAB — HEPATIC FUNCTION PANEL
ALBUMIN: 4.3 g/dL (ref 3.5–5.2)
ALK PHOS: 57 U/L (ref 39–117)
ALT: 33 U/L (ref 0–35)
AST: 29 U/L (ref 0–37)
Bilirubin, Direct: 0.1 mg/dL (ref 0.0–0.3)
Total Bilirubin: 0.7 mg/dL (ref 0.2–1.2)
Total Protein: 7.8 g/dL (ref 6.0–8.3)

## 2014-08-02 LAB — BASIC METABOLIC PANEL
BUN: 17 mg/dL (ref 6–23)
CHLORIDE: 100 meq/L (ref 96–112)
CO2: 29 meq/L (ref 19–32)
Calcium: 9.5 mg/dL (ref 8.4–10.5)
Creatinine, Ser: 0.69 mg/dL (ref 0.40–1.20)
GFR: 93.03 mL/min (ref >60.00)
GLUCOSE: 150 mg/dL — AB (ref 70–99)
POTASSIUM: 4.1 meq/L (ref 3.5–5.1)
Sodium: 136 mEq/L (ref 135–145)

## 2014-08-02 LAB — MICROALBUMIN / CREATININE URINE RATIO
Creatinine,U: 117.2 mg/dL
Microalb Creat Ratio: 0.6 mg/g (ref 0.0–30.0)
Microalb, Ur: <0.7 mg/dL (ref 0.0–1.9)

## 2014-08-02 LAB — TSH: TSH: 39.09 u[IU]/mL — AB (ref 0.35–4.50)

## 2014-08-02 LAB — HEMOGLOBIN A1C: Hgb A1c MFr Bld: 7.1 % — ABNORMAL HIGH (ref 4.6–6.5)

## 2014-08-02 LAB — T4, FREE: Free T4: 0.4 ng/dL — ABNORMAL LOW (ref 0.60–1.60)

## 2014-08-02 NOTE — Telephone Encounter (Signed)
Sent to the pharmacy by e-scribe. 

## 2014-08-02 NOTE — Progress Notes (Signed)
Pre visit review using our clinic review tool, if applicable. No additional management support is needed unless otherwise documented below in the visit note.  Chief Complaint  Patient presents with  . Follow-up    Preventive overdue  . Diabetes    HPI: Alisha Williams 59 y.o.  comes in for chronic disease/ medication management  Way overdue for fu diabetes etc.  cancelations many reasons.. Due for pap etc  See multiple cancellations last labs were over a year ago. DM:  About 160 - 180 in a.m. otherwise fine .  Fasting  .   Mild and peanut butter at night to help took Metformin in past  And noted she had severe muscle aches and stiffness and was hard to get up .  With muscle aches and accidentally stopped when left med at home and felt a lot better so never went back on   checkiung readings but no meds  No numbness  . Eye check  Healthy eyes.  No dm .  Dry eyes.  Mammogram.    due Not taking asa at this time.  Late on pap . change insurance .  Will schedule  in the next few months  Vinegar  cinnamon for her diabetes seems to help  Taking kelp and decided to stop stop thyroid med  About a month ago because she thinks that is going to be fine for her thyroid treatment. States she feels pretty good no syncope infections fevers cardiovascular pulmonary symptoms. ROS: See pertinent positives and negatives per HPI.  Past Medical History  Diagnosis Date  . Anemia     nos  . Anxiety   . Depression   . Hypothyroidism     Family History  Problem Relation Age of Onset  . Thyroid disease Mother   . Hypertension Mother   . Arthritis Mother   . Fibromyalgia Mother   . Breast cancer Mother   . Heart attack Father   . Breast cancer Sister   . Thyroid disease Sister     History   Social History  . Marital Status: Married    Spouse Name: N/A  . Number of Children: N/A  . Years of Education: N/A   Social History Main Topics  . Smoking status: Never Smoker   . Smokeless tobacco: Not  on file  . Alcohol Use: No  . Drug Use: Not on file  . Sexual Activity: Not on file   Other Topics Concern  . 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 ferrous sulfate 325 (65 FE) MG tablet Take 325 mg by mouth daily with breakfast.      . glucose blood (ONETOUCH VERIO) test strip 1 each by Other route as needed for other. Use as instructed    . MULTIPLE VITAMIN PO Take 1 tablet by mouth daily.     Glory Rosebush DELICA LANCETS FINE MISC by Does not apply route. Use as instructed    . insulin aspart (NOVOLOG FLEXPEN) 100 UNIT/ML FlexPen Inject 6 units. Use as instructed    . levothyroxine (SYNTHROID, LEVOTHROID) 75 MCG tablet Take 1 tablet (75 mcg total) by mouth daily. (Patient not taking: Reported on 08/02/2014) 90 tablet 0  . metFORMIN (GLUCOPHAGE-XR) 500 MG 24 hr tablet TAKE 1 TABLET (500 MG TOTAL) BY  MOUTH DAILY WITH BREAKFAST. INCREASE TO  2 PER DAY IN 2 WEEKS AND THEN 3 PER DAY IN  4 WEEKS OR AS DIRECTED (Patient not taking: Reported on 08/02/2014) 60 tablet 0   No facility-administered medications prior to visit.     EXAM:  BP 136/84 mmHg  Temp(Src) 98.7 F (37.1 C) (Oral)  Ht _0  (1.727 m)  Wt 257 lb 9.6 oz (116.847 kg)  BMI 39.18 kg/m2  Body mass index is 39.18 kg/(m^2). Wt Readings from Last 3 Encounters:  08/02/14 257 lb 9.6 oz (116.847 kg)  03/19/13 247 lb (112.038 kg)  02/13/13 247 lb (112.038 kg)    GENERAL: vitals reviewed and listed above, alert, oriented, appears well hydrated and in no acute distress HEENT: atraumatic, conjunctiva  clear, no obvious abnormalities on inspection of external nose and ears NECK: no obvious masses on inspection palpation  Thyroid palpable no nodule felt  LUNGS: clear to auscultation bilaterally, no wheezes, rales or rhonchi, good air movement CV: HRRR, no clubbing cyanosis  or  peripheral edema nl cap refill  Abdomen:  Sof,t normal bowel sounds without hepatosplenomegaly, no guarding rebound or masses no CVA tenderness MS: moves all extremities without noticeable focal  abnormality PSYCH: pleasant and cooperative, no obvious depression or anxiety  Health Maintenance Due  Topic Date Due  . PNEUMOCOCCAL POLYSACCHARIDE VACCINE (1) 01/25/1959  . FOOT EXAM  01/25/1967  . OPHTHALMOLOGY EXAM  01/25/1967  . URINE MICROALBUMIN  01/25/1967  . MAMMOGRAM  01/25/2007  . PAP SMEAR  04/16/2012  . HEMOGLOBIN A1C  11/06/2013   Wt Readings from Last 3 Encounters:  08/02/14 257 lb 9.6 oz (116.847 kg)  03/19/13 247 lb (112.038 kg)  02/13/13 247 lb (112.038 kg)   BP Readings from Last 3 Encounters:  08/02/14 136/84  03/19/13 120/80  02/13/13 110/80   Diabetic Foot Exam - Simple   Simple Foot Form  Diabetic Foot exam was performed with the following findings:  Yes 08/02/2014 10:13 AM  Visual Inspection  No deformities, no ulcerations, no other skin breakdown bilaterally:  Yes  Sensation Testing  Intact to touch and monofilament testing bilaterally:  Yes  Pulse Check  Posterior Tibialis and Dorsalis pulse intact bilaterally:  Yes  Comments       ASSESSMENT AND PLAN:  Discussed the following assessment and plan:  Diabetes type 2, uncontrolled - off all meds ? if had se of metformin myalgias rechallenge  no defineed nueropathy or retinoptahy  - Plan: Basic metabolic panel, CBC with Differential/Platelet, Hemoglobin A1c, Hepatic function panel, Lipid panel, TSH, T4, free, Microalbumin / creatinine urine ratio  Hypothyroidism, unspecified hypothyroidism type - off  le[place]yr for about a month taking kelp in stead disc about limitations in first world i defic not common cause of thyroid dysfunction - Plan: Basic metabolic panel, CBC with Differential/Platelet, Hemoglobin A1c, Hepatic function panel, Lipid panel, TSH, T4, free, Microalbumin / creatinine urine  ratio  Hyperlipidemia - Plan: Basic metabolic panel, CBC with Differential/Platelet, Hemoglobin A1c, Hepatic function panel, Lipid panel, TSH, T4, free, Microalbumin / creatinine urine ratio  Pneumococcal vaccination declined - said tdap caused 5 day migraine and wants to hold off on immuniz. General he is declining routine standard of care per by algorithm but we discussed each things that shared decision making. We'll check labs today and then reassess. She may well do fine on rechallenge in with the metformin don't know why she would have that side effect. She is declining Vaccines today because she had a  migraine that was prolonged with fatigue. She is not particularly high risk we'll readdress at future time. -Patient advised to return or notify health care team  if symptoms worsen ,persist or new concerns arise.  Patient Instructions  Will notify you  of labs when available.   Try the metformin  again  To make sure not an issue.     Diabetes and Standards of Medical Care Diabetes is complicated. You may find that your diabetes team includes a dietitian, nurse, diabetes educator, eye doctor, and more. To help everyone know what is going on and to help you get the care you deserve, the following schedule of care was developed to help keep you on track. Below are the tests, exams, vaccines, medicines, education, and plans you will need. HbA1c test This test shows how well you have controlled your glucose over the past 2-3 months. It is used to see if your diabetes management plan needs to be adjusted.   It is performed at least 2 times a year if you are meeting treatment goals.  It is performed 4 times a year if therapy has changed or if you are not meeting treatment goals. Blood pressure test  This test is performed at every routine medical visit. The goal is less than 140/90 mm Hg for most people, but 130/80 mm Hg in some cases. Ask your health care provider about your goal. Dental  exam  Follow up with the dentist regularly. Eye exam  If you are diagnosed with type 1 diabetes as a child, get an exam upon reaching the age of 58 years or older and have had diabetes for 3-5 years. Yearly eye exams are recommended after that initial eye exam.  If you are diagnosed with type 1 diabetes as an adult, get an exam within 5 years of diagnosis and then yearly.  If you are diagnosed with type 2 diabetes, get an exam as soon as possible after the diagnosis and then yearly. Foot care exam  Visual foot exams are performed at every routine medical visit. The exams check for cuts, injuries, or other problems with the feet.  A comprehensive foot exam should be done yearly. This includes visual inspection as well as assessing foot pulses and testing for loss of sensation.  Check your feet nightly for cuts, injuries, or other problems with your feet. Tell your health care provider if anything is not healing. Kidney function test (urine microalbumin)  This test is performed once a year.  Type 1 diabetes: The first test is performed 5 years after diagnosis.  Type 2 diabetes: The first test is performed at the time of diagnosis.  A serum creatinine and estimated glomerular filtration rate (eGFR) test is done once a year to assess the level of chronic kidney disease (CKD), if present. Lipid profile (cholesterol, HDL, LDL, triglycerides)  Performed every 5 years for most people.  The goal for LDL is less than 100 mg/dL. If you are at high risk, the goal is less than 70 mg/dL.  The goal for HDL is 40 mg/dL-50 mg/dL for men and 50 mg/dL-60 mg/dL for women. An HDL cholesterol of 60 mg/dL or higher gives some protection against heart disease.  The goal for triglycerides is less than 150 mg/dL. Influenza vaccine, pneumococcal vaccine, and hepatitis B vaccine  The influenza vaccine is recommended yearly.  It is recommended that people with diabetes who are over 93 years old get the  pneumonia vaccine. In some cases, two separate shots may  be given. Ask your health care provider if your pneumonia vaccination is up to date.  The hepatitis B vaccine is also recommended for adults with diabetes. Diabetes self-management education  Education is recommended at diagnosis and ongoing as needed. Treatment plan  Your treatment plan is reviewed at every medical visit. Document Released: 11/08/2008 Document Revised: 05/28/2013 Document Reviewed: 06/13/2012 [facility] Patient Information 2015 [place], [place]. This information is not intended to replace advice given to you by your health care provider. Make sure you discuss any questions you have with your health care provider.      Standley Brooking. Panosh M.D.

## 2014-08-02 NOTE — Patient Instructions (Signed)
Will notify you  of labs when available.   Try the metformin  again  To make sure not an issue.     Diabetes and Standards of Medical Care Diabetes is complicated. You may find that your diabetes team includes a dietitian, nurse, diabetes educator, eye doctor, and more. To help everyone know what is going on and to help you get the care you deserve, the following schedule of care was developed to help keep you on track. Below are the tests, exams, vaccines, medicines, education, and plans you will need. HbA1c test This test shows how well you have controlled your glucose over the past 2-3 months. It is used to see if your diabetes management plan needs to be adjusted.   It is performed at least 2 times a year if you are meeting treatment goals.  It is performed 4 times a year if therapy has changed or if you are not meeting treatment goals. Blood pressure test  This test is performed at every routine medical visit. The goal is less than 140/90 mm Hg for most people, but 130/80 mm Hg in some cases. Ask your health care provider about your goal. Dental exam  Follow up with the dentist regularly. Eye exam  If you are diagnosed with type 1 diabetes as a child, get an exam upon reaching the age of 65 years or older and have had diabetes for 3-5 years. Yearly eye exams are recommended after that initial eye exam.  If you are diagnosed with type 1 diabetes as an adult, get an exam within 5 years of diagnosis and then yearly.  If you are diagnosed with type 2 diabetes, get an exam as soon as possible after the diagnosis and then yearly. Foot care exam  Visual foot exams are performed at every routine medical visit. The exams check for cuts, injuries, or other problems with the feet.  A comprehensive foot exam should be done yearly. This includes visual inspection as well as assessing foot pulses and testing for loss of sensation.  Check your feet nightly for cuts, injuries, or other  problems with your feet. Tell your health care provider if anything is not healing. Kidney function test (urine microalbumin)  This test is performed once a year.  Type 1 diabetes: The first test is performed 5 years after diagnosis.  Type 2 diabetes: The first test is performed at the time of diagnosis.  A serum creatinine and estimated glomerular filtration rate (eGFR) test is done once a year to assess the level of chronic kidney disease (CKD), if present. Lipid profile (cholesterol, HDL, LDL, triglycerides)  Performed every 5 years for most people.  The goal for LDL is less than 100 mg/dL. If you are at high risk, the goal is less than 70 mg/dL.  The goal for HDL is 40 mg/dL-50 mg/dL for men and 50 mg/dL-60 mg/dL for women. An HDL cholesterol of 60 mg/dL or higher gives some protection against heart disease.  The goal for triglycerides is less than 150 mg/dL. Influenza vaccine, pneumococcal vaccine, and hepatitis B vaccine  The influenza vaccine is recommended yearly.  It is recommended that people with diabetes who are over 45 years old get the pneumonia vaccine. In some cases, two separate shots may be given. Ask your health care provider if your pneumonia vaccination is up to date.  The hepatitis B vaccine is also recommended for adults with diabetes. Diabetes self-management education  Education is recommended at diagnosis and ongoing as needed.  Treatment plan  Your treatment plan is reviewed at every medical visit. Document Released: 11/08/2008 Document Revised: 05/28/2013 Document Reviewed: 06/13/2012 [facility] Patient Information 2015 [place], [place]. This information is not intended to replace advice given to you by your health care provider. Make sure you discuss any questions you have with your health care provider.

## 2014-08-07 ENCOUNTER — Other Ambulatory Visit: Payer: Self-pay | Admitting: Family Medicine

## 2014-08-07 MED ORDER — LEVOTHYROXINE SODIUM 75 MCG PO TABS: 75.0000 ug | ORAL_TABLET | Freq: Every day | ORAL | Status: DC

## 2014-11-01 ENCOUNTER — Encounter: Payer: Self-pay | Admitting: Internal Medicine

## 2014-11-01 ENCOUNTER — Ambulatory Visit (INDEPENDENT_AMBULATORY_CARE_PROVIDER_SITE_OTHER): Payer: PRIVATE HEALTH INSURANCE | Admitting: Internal Medicine

## 2014-11-01 VITALS — BP 146/92 | Temp 98.4°F | Wt 263.1 lb

## 2014-11-01 DIAGNOSIS — M5416 Radiculopathy, lumbar region: Secondary | ICD-10-CM | POA: Diagnosis not present

## 2014-11-01 DIAGNOSIS — IMO0001 Reserved for inherently not codable concepts without codable children: Secondary | ICD-10-CM

## 2014-11-01 DIAGNOSIS — M62830 Muscle spasm of back: Secondary | ICD-10-CM

## 2014-11-01 MED ORDER — KETOROLAC TROMETHAMINE 60 MG/2ML IM SOLN
60.0000 mg | Freq: Once | INTRAMUSCULAR | Status: AC
  Administered 2014-11-01: 60 mg via INTRAMUSCULAR

## 2014-11-01 MED ORDER — PREDNISONE 10 MG PO TABS: ORAL_TABLET | ORAL | Status: DC

## 2014-11-01 MED ORDER — HYDROCODONE-ACETAMINOPHEN 5-325 MG PO TABS: 1.0000 | ORAL_TABLET | Freq: Four times a day (QID) | ORAL | Status: DC | PRN

## 2014-11-01 MED ORDER — CYCLOBENZAPRINE HCL 5 MG PO TABS: 5.0000 mg | ORAL_TABLET | Freq: Three times a day (TID) | ORAL | Status: DC | PRN

## 2014-11-01 NOTE — Patient Instructions (Signed)
Shot of antiinflammatory today .  mulscle relaxant at night  Or as needed.  Prednisone if monitoring your bg   Can rasie it temporarily  If not improving significanly in 1-2 weeks then plan fu visit and consider imaging and or referral   Sciatica Sciatica is pain, weakness, numbness, or tingling along the path of the sciatic nerve. The nerve starts in the lower back and runs down the back of each leg. The nerve controls the muscles in the lower leg and in the back of the knee, while also providing sensation to the back of the thigh, lower leg, and the sole of your foot. Sciatica is a symptom of another medical condition. For instance, nerve damage or certain conditions, such as a herniated disk or bone spur on the spine, pinch or put pressure on the sciatic nerve. This causes the pain, weakness, or other sensations normally associated with sciatica. Generally, sciatica only affects one side of the body. CAUSES   Herniated or slipped disc.  Degenerative disk disease.  A pain disorder involving the narrow muscle in the buttocks (piriformis syndrome).  Pelvic injury or fracture.  Pregnancy.  Tumor (rare). SYMPTOMS  Symptoms can vary from mild to very severe. The symptoms usually travel from the low back to the buttocks and down the back of the leg. Symptoms can include:  Mild tingling or dull aches in the lower back, leg, or hip.  Numbness in the back of the calf or sole of the foot.  Burning sensations in the lower back, leg, or hip.  Sharp pains in the lower back, leg, or hip.  Leg weakness.  Severe back pain inhibiting movement. These symptoms may get worse with coughing, sneezing, laughing, or prolonged sitting or standing. Also, being overweight may worsen symptoms. DIAGNOSIS  Your caregiver will perform a physical exam to look for common symptoms of sciatica. He or she may ask you to do certain movements or activities that would trigger sciatic nerve pain. Other tests may be  performed to find the cause of the sciatica. These may include:  Blood tests.  X-rays.  Imaging tests, such as an MRI or CT scan. TREATMENT  Treatment is directed at the cause of the sciatic pain. Sometimes, treatment is not necessary and the pain and discomfort goes away on its own. If treatment is needed, your caregiver may suggest:  Over-the-counter medicines to relieve pain.  Prescription medicines, such as anti-inflammatory medicine, muscle relaxants, or narcotics.  Applying heat or ice to the painful area.  Steroid injections to lessen pain, irritation, and inflammation around the nerve.  Reducing activity during periods of pain.  Exercising and stretching to strengthen your abdomen and improve flexibility of your spine. Your caregiver may suggest losing weight if the extra weight makes the back pain worse.  Physical therapy.  Surgery to eliminate what is pressing or pinching the nerve, such as a bone spur or part of a herniated disk. HOME CARE INSTRUCTIONS   Only take over-the-counter or prescription medicines for pain or discomfort as directed by your caregiver.  Apply ice to the affected area for 20 minutes, 3-4 times a day for the first 48-72 hours. Then try heat in the same way.  Exercise, stretch, or perform your usual activities if these do not aggravate your pain.  Attend physical therapy sessions as directed by your caregiver.  Keep all follow-up appointments as directed by your caregiver.  Do not wear high heels or shoes that do not provide proper support.  Check your mattress to see if it is too soft. A firm mattress may lessen your pain and discomfort. SEEK IMMEDIATE MEDICAL CARE IF:   You lose control of your bowel or bladder (incontinence).  You have increasing weakness in the lower back, pelvis, buttocks, or legs.  You have redness or swelling of your back.  You have a burning sensation when you urinate.  You have pain that gets worse when you  lie down or awakens you at night.  Your pain is worse than you have experienced in the past.  Your pain is lasting longer than 4 weeks.  You are suddenly losing weight without reason. MAKE SURE YOU:  Understand these instructions.  Will watch your condition.  Will get help right away if you are not doing well or get worse.   This information is not intended to replace advice given to you by your health care provider. Make sure you discuss any questions you have with your health care provider.   Document Released: 01/05/2001 Document Revised: 10/02/2014 Document Reviewed: 05/23/2011 [employer] Interactive Patient Education [employer].

## 2014-11-01 NOTE — Progress Notes (Signed)
Pre visit review using our clinic review tool, if applicable. No additional management support is needed unless otherwise documented below in the visit note.   Chief Complaint  Patient presents with  . Back Pain    numbness right leg    HPI: Patient Alisha Williams  comes in today for SDA for  new problem evaluation. Hx of off and on back pain spasm   For a while    Usually gets better with tylenol and short time   But now much worse " now  Every musclke in back is in spasm "  Getting worse   Last 24 hours . Wouldn't sleep. No event inciter . Right leg going numb  Down the back with right low back spasm .Marland Kitchen  Better with  Walking but  worse with sleep and . Numbness worse with walking  One day . Of severe   Crescendo pain  No fever uti sx.   For a month  Has been progressing  Limiting  Foot and pain  And down to leg.  advil  Tylenol    No help.  ROS: See pertinent positives and negatives per HPI.no bowel bladder changes  Or foot drop or injury . Says diabetes ok 120 - 150 range   Past Medical History  Diagnosis Date  . Anemia     nos  . Anxiety   . Depression   . Hypothyroidism     Family History  Problem Relation Age of Onset  . Thyroid disease Mother   . Hypertension Mother   . Arthritis Mother   . Fibromyalgia Mother   . Breast cancer Mother   . Heart attack Father   . Breast cancer Sister   . Thyroid disease Sister     Social History   Social History  . Marital Status: Married    Spouse Name: N/A  . Number of Children: N/A  . Years of Education: N/A   Social History Main Topics  . Smoking status: Never Smoker   . Smokeless tobacco: None  . Alcohol Use: No  . Drug Use: None  . Sexual Activity: Not Asked   Other Topics Concern  . 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 CINNAMON PO Take by mouth.     . ferrous sulfate 325 (65 FE) MG tablet Take 325 mg by mouth daily with breakfast.      . glucose blood (ONETOUCH VERIO) test strip 1 each by Other route as needed for other. Use as instructed    . Iodine, Kelp, (KELP PO) Take by mouth.    . levothyroxine (SYNTHROID, LEVOTHROID) 75 MCG tablet Take 1 tablet (75 mcg total) by mouth daily. 90 tablet 1  . metFORMIN (GLUCOPHAGE-XR) 500 MG 24 hr tablet TAKE 1 TABLET (500 MG TOTAL) BY MOUTH DAILY WITH BREAKFAST. INCREASE TO  2 PER DAY IN 2 WEEKS AND THEN 3 PER DAY IN  4 WEEKS OR AS DIRECTED 60 tablet 2  . MULTIPLE VITAMIN PO Take 1 tablet by mouth daily.     Glory Rosebush DELICA LANCETS FINE MISC by Does not apply route. Use as instructed    . insulin aspart (NOVOLOG FLEXPEN) 100 UNIT/ML FlexPen Inject 6 units. Use as instructed    . metFORMIN (GLUCOPHAGE-XR) 500 MG 24 hr  tablet TAKE 1 TABLET (500 MG TOTAL) BY MOUTH DAILY WITH BREAKFAST. INCREASE TO  2 PER DAY IN 2 WEEKS AND THEN 3 PER DAY IN  4 WEEKS OR AS DIRECTED (Patient not taking: Reported on 08/02/2014) 60 tablet 0   No facility-administered medications prior to visit.     EXAM:  BP 146/92 mmHg  Temp(Src) 98.4 F (36.9 C) (Oral)  Wt 263 lb 1.6 oz (119.341 kg)  Body mass index is 40.01 kg/(m^2).  GENERAL: vitals reviewed and listed above, alert, oriented, appears well hydrated and in mod  acute distress prefers to stand than sit but can move  HEENT: atraumatic, conjunctiva  clear, no obvious abnormalities on inspection of external nose and ears LUNGS: clear to auscultation bilaterally, no wheezes, rales or rhonchi, good air movement lipoma  CV: HRRR, no clubbing cyanosis a nl cap refill  MS: moves all extremities without noticeable focal  Abnormality  Spine no midline tenderness  Right si buttock area tender   Pos spasm  Toe heel walk nl dtrs brisk bilaterally and no clonus   Pos ? slr  rom hip ok  PSYCH: pleasant and cooperative, cognition normal   ASSESSMENT AND PLAN:  Discussed the  following assessment and plan:  Back muscle spasm - Plan: ketorolac (TORADOL) injection 60 mg  Radicular pain of right lower back - Plan: ketorolac (TORADOL) injection 60 mg Exam reassuring for neuro sx  No weakness and reflexes intact  In mod pain today nmechaninchal pain .  Back sx pre dated this flare for a month   If not getting better consider imaging mri or referral .  cautionsed about steroid if needed to check bgs  And contact if out of control   -Patient advised to return or notify health care team  if symptoms worsen ,persist or new concerns arise.  Patient Instructions  Shot of antiinflammatory today .  mulscle relaxant at night  Or as needed.  Prednisone if monitoring your bg   Can rasie it temporarily  If not improving significanly in 1-2 weeks then plan fu visit and consider imaging and or referral   Sciatica Sciatica is pain, weakness, numbness, or tingling along the path of the sciatic nerve. The nerve starts in the lower back and runs down the back of each leg. The nerve controls the muscles in the lower leg and in the back of the knee, while also providing sensation to the back of the thigh, lower leg, and the sole of your foot. Sciatica is a symptom of another medical condition. For instance, nerve damage or certain conditions, such as a herniated disk or bone spur on the spine, pinch or put pressure on the sciatic nerve. This causes the pain, weakness, or other sensations normally associated with sciatica. Generally, sciatica only affects one side of the body. CAUSES   Herniated or slipped disc.  Degenerative disk disease.  A pain disorder involving the narrow muscle in the buttocks (piriformis syndrome).  Pelvic injury or fracture.  Pregnancy.  Tumor (rare). SYMPTOMS  Symptoms can vary from mild to very severe. The symptoms usually travel from the low back to the buttocks and down the back of the leg. Symptoms can include:  Mild tingling or dull aches in the  lower back, leg, or hip.  Numbness in the back of the calf or sole of the foot.  Burning sensations in the lower back, leg, or hip.  Sharp pains in the lower back, leg, or hip.  Leg weakness.  Severe  back pain inhibiting movement. These symptoms may get worse with coughing, sneezing, laughing, or prolonged sitting or standing. Also, being overweight may worsen symptoms. DIAGNOSIS  Your caregiver will perform a physical exam to look for common symptoms of sciatica. He or she may ask you to do certain movements or activities that would trigger sciatic nerve pain. Other tests may be performed to find the cause of the sciatica. These may include:  Blood tests.  X-rays.  Imaging tests, such as an MRI or CT scan. TREATMENT  Treatment is directed at the cause of the sciatic pain. Sometimes, treatment is not necessary and the pain and discomfort goes away on its own. If treatment is needed, your caregiver may suggest:  Over-the-counter medicines to relieve pain.  Prescription medicines, such as anti-inflammatory medicine, muscle relaxants, or narcotics.  Applying heat or ice to the painful area.  Steroid injections to lessen pain, irritation, and inflammation around the nerve.  Reducing activity during periods of pain.  Exercising and stretching to strengthen your abdomen and improve flexibility of your spine. Your caregiver may suggest losing weight if the extra weight makes the back pain worse.  Physical therapy.  Surgery to eliminate what is pressing or pinching the nerve, such as a bone spur or part of a herniated disk. HOME CARE INSTRUCTIONS   Only take over-the-counter or prescription medicines for pain or discomfort as directed by your caregiver.  Apply ice to the affected area for 20 minutes, 3-4 times a day for the first 48-72 hours. Then try heat in the same way.  Exercise, stretch, or perform your usual activities if these do not aggravate your pain.  Attend physical  therapy sessions as directed by your caregiver.  Keep all follow-up appointments as directed by your caregiver.  Do not wear high heels or shoes that do not provide proper support.  Check your mattress to see if it is too soft. A firm mattress may lessen your pain and discomfort. SEEK IMMEDIATE MEDICAL CARE IF:   You lose control of your bowel or bladder (incontinence).  You have increasing weakness in the lower back, pelvis, buttocks, or legs.  You have redness or swelling of your back.  You have a burning sensation when you urinate.  You have pain that gets worse when you lie down or awakens you at night.  Your pain is worse than you have experienced in the past.  Your pain is lasting longer than 4 weeks.  You are suddenly losing weight without reason. MAKE SURE YOU:  Understand these instructions.  Will watch your condition.  Will get help right away if you are not doing well or get worse.   This information is not intended to replace advice given to you by your health care provider. Make sure you discuss any questions you have with your health care provider.   Document Released: 01/05/2001 Document Revised: 10/02/2014 Document Reviewed: 05/23/2011 Elsevier Interactive Patient Education 2016 [place]. Lorana Maffeo M.D.

## 2014-11-11 ENCOUNTER — Other Ambulatory Visit: Payer: PRIVATE HEALTH INSURANCE

## 2014-11-18 ENCOUNTER — Other Ambulatory Visit (INDEPENDENT_AMBULATORY_CARE_PROVIDER_SITE_OTHER): Payer: PRIVATE HEALTH INSURANCE

## 2014-11-18 DIAGNOSIS — Z0000 Encounter for general adult medical examination without abnormal findings: Secondary | ICD-10-CM | POA: Diagnosis not present

## 2014-11-18 DIAGNOSIS — R7989 Other specified abnormal findings of blood chemistry: Secondary | ICD-10-CM | POA: Diagnosis not present

## 2014-11-18 DIAGNOSIS — E119 Type 2 diabetes mellitus without complications: Secondary | ICD-10-CM | POA: Diagnosis not present

## 2014-11-18 LAB — CBC WITH DIFFERENTIAL/PLATELET
BASOS PCT: 0.3 % (ref 0.0–3.0)
Basophils Absolute: 0 10*3/uL (ref 0.0–0.1)
EOS PCT: 3.7 % (ref 0.0–5.0)
Eosinophils Absolute: 0.2 10*3/uL (ref 0.0–0.7)
HCT: 41.2 % (ref 36.0–46.0)
HEMOGLOBIN: 13.3 g/dL (ref 12.0–15.0)
LYMPHS ABS: 3 10*3/uL (ref 0.7–4.0)
Lymphocytes Relative: 46.9 % — ABNORMAL HIGH (ref 12.0–46.0)
MCHC: 32.3 g/dL (ref 30.0–36.0)
MCV: 85.1 fl (ref 78.0–100.0)
MONO ABS: 0.5 10*3/uL (ref 0.1–1.0)
Monocytes Relative: 7.8 % (ref 3.0–12.0)
NEUTROS PCT: 41.3 % — AB (ref 43.0–77.0)
Neutro Abs: 2.6 10*3/uL (ref 1.4–7.7)
Platelets: 262 10*3/uL (ref 150.0–400.0)
RBC: 4.84 Mil/uL (ref 3.87–5.11)
RDW: 16 % — AB (ref 11.5–15.5)
WBC: 6.4 10*3/uL (ref 4.0–10.5)

## 2014-11-18 LAB — BASIC METABOLIC PANEL
BUN: 14 mg/dL (ref 6–23)
CO2: 30 meq/L (ref 19–32)
CREATININE: 0.72 mg/dL (ref 0.40–1.20)
Calcium: 9.7 mg/dL (ref 8.4–10.5)
Chloride: 98 mEq/L (ref 96–112)
GFR: 88.48 mL/min (ref >60.00)
GLUCOSE: 155 mg/dL — AB (ref 70–99)
Potassium: 4.1 mEq/L (ref 3.5–5.1)
Sodium: 135 mEq/L (ref 135–145)

## 2014-11-18 LAB — LIPID PANEL
Cholesterol: 231 mg/dL — ABNORMAL HIGH (ref 0–200)
HDL: 39.2 mg/dL (ref >39.00)
NonHDL: 191.94
Total CHOL/HDL Ratio: 6
Triglycerides: 388 mg/dL — ABNORMAL HIGH (ref 0.0–149.0)
VLDL: 77.6 mg/dL — AB (ref 0.0–40.0)

## 2014-11-18 LAB — HEMOGLOBIN A1C: Hgb A1c MFr Bld: 7.4 % — ABNORMAL HIGH (ref 4.6–6.5)

## 2014-11-18 LAB — HEPATIC FUNCTION PANEL
ALBUMIN: 4.6 g/dL (ref 3.5–5.2)
ALT: 41 U/L — AB (ref 0–35)
AST: 40 U/L — AB (ref 0–37)
Alkaline Phosphatase: 59 U/L (ref 39–117)
BILIRUBIN TOTAL: 0.7 mg/dL (ref 0.2–1.2)
Bilirubin, Direct: 0.1 mg/dL (ref 0.0–0.3)
Total Protein: 8.2 g/dL (ref 6.0–8.3)

## 2014-11-18 LAB — LDL CHOLESTEROL, DIRECT: LDL DIRECT: 158 mg/dL

## 2014-11-18 LAB — TSH: TSH: 70.89 u[IU]/mL — AB (ref 0.35–4.50)

## 2015-01-10 ENCOUNTER — Other Ambulatory Visit: Payer: PRIVATE HEALTH INSURANCE

## 2015-02-04 ENCOUNTER — Encounter: Payer: PRIVATE HEALTH INSURANCE | Admitting: Internal Medicine

## 2015-02-04 NOTE — Progress Notes (Signed)
Document opened and reviewed for OV but appt  canceled same day .  

## 2015-02-06 ENCOUNTER — Other Ambulatory Visit: Payer: Self-pay | Admitting: Family Medicine

## 2015-02-06 ENCOUNTER — Telehealth: Payer: Self-pay | Admitting: Family Medicine

## 2015-02-06 DIAGNOSIS — IMO0001 Reserved for inherently not codable concepts without codable children: Secondary | ICD-10-CM

## 2015-02-06 DIAGNOSIS — E039 Hypothyroidism, unspecified: Secondary | ICD-10-CM

## 2015-02-06 DIAGNOSIS — E1165 Type 2 diabetes mellitus with hyperglycemia: Secondary | ICD-10-CM

## 2015-02-06 MED ORDER — LEVOTHYROXINE SODIUM 75 MCG PO TABS: 75.0000 ug | ORAL_TABLET | Freq: Every day | ORAL | Status: DC

## 2015-02-06 NOTE — Telephone Encounter (Signed)
Spoke to the pt.  Made lab and follow up appt.  Orders placed in the system.  Pt notified to continue thyroid medication.  Refill sent to the pharmacy.

## 2015-02-06 NOTE — Telephone Encounter (Signed)
Lab orders placed in the system.

## 2015-02-06 NOTE — Telephone Encounter (Signed)
-----   Message from Burnis Medin, MD sent at 02/06/2015  8:19 AM EST ----- We need to check her tsh  Hg a1c end of January and then office visit . Tell her to take her thyroid medication. Keep the may appt also.  Please turn this into a phone note or  Other way to get this in the medical record. Thanks  WP  ----- Message -----    From: Hulda Humphrey, CMA    Sent: 02/04/2015   2:51 PM      To: Burnis Medin, MD  Pt is scheduled in May for CPX and lab work.  She agreed to come in earlier if you need to see her before then.  Please advise.  Thanks!

## 2015-03-03 ENCOUNTER — Other Ambulatory Visit: Payer: PRIVATE HEALTH INSURANCE

## 2015-03-06 ENCOUNTER — Ambulatory Visit: Payer: PRIVATE HEALTH INSURANCE | Admitting: Internal Medicine

## 2015-03-14 ENCOUNTER — Other Ambulatory Visit: Payer: PRIVATE HEALTH INSURANCE

## 2015-03-17 ENCOUNTER — Other Ambulatory Visit: Payer: PRIVATE HEALTH INSURANCE

## 2015-03-20 ENCOUNTER — Telehealth: Payer: Self-pay | Admitting: Family Medicine

## 2015-03-20 ENCOUNTER — Ambulatory Visit: Payer: PRIVATE HEALTH INSURANCE | Admitting: Internal Medicine

## 2015-03-20 NOTE — Telephone Encounter (Signed)
Pt missed her appointment with Dr. Regis Bill today and missed her lab appointment.  Tried to reach the pt.  No answer or machine/voicemail.

## 2015-06-18 ENCOUNTER — Other Ambulatory Visit: Payer: PRIVATE HEALTH INSURANCE

## 2015-06-24 NOTE — Progress Notes (Signed)
Document opened and reviewed for OV but appt  NS same day .   

## 2015-06-25 ENCOUNTER — Encounter: Payer: PRIVATE HEALTH INSURANCE | Admitting: Internal Medicine

## 2015-06-25 DIAGNOSIS — Z0289 Encounter for other administrative examinations: Secondary | ICD-10-CM

## 2019-10-01 ENCOUNTER — Other Ambulatory Visit: Payer: Self-pay

## 2019-10-01 ENCOUNTER — Emergency Department (HOSPITAL_COMMUNITY)
Admission: EM | Admit: 2019-10-01 | Discharge: 2019-10-01 | Disposition: A | Discharge status: Home / Self Care | Payer: PRIVATE HEALTH INSURANCE | Attending: Emergency Medicine | Admitting: Emergency Medicine

## 2019-10-01 ENCOUNTER — Emergency Department (HOSPITAL_COMMUNITY): Payer: PRIVATE HEALTH INSURANCE

## 2019-10-01 DIAGNOSIS — D7389 Other diseases of spleen: Secondary | ICD-10-CM

## 2019-10-01 DIAGNOSIS — E039 Hypothyroidism, unspecified: Secondary | ICD-10-CM | POA: Insufficient documentation

## 2019-10-01 DIAGNOSIS — M545 Low back pain, unspecified: Secondary | ICD-10-CM

## 2019-10-01 DIAGNOSIS — Z79890 Hormone replacement therapy (postmenopausal): Secondary | ICD-10-CM | POA: Insufficient documentation

## 2019-10-01 DIAGNOSIS — Z7984 Long term (current) use of oral hypoglycemic drugs: Secondary | ICD-10-CM | POA: Insufficient documentation

## 2019-10-01 DIAGNOSIS — Z79899 Other long term (current) drug therapy: Secondary | ICD-10-CM | POA: Insufficient documentation

## 2019-10-01 DIAGNOSIS — D739 Disease of spleen, unspecified: Secondary | ICD-10-CM | POA: Insufficient documentation

## 2019-10-01 DIAGNOSIS — E119 Type 2 diabetes mellitus without complications: Secondary | ICD-10-CM | POA: Insufficient documentation

## 2019-10-01 LAB — CBC
HCT: 41.8 % (ref 36.0–46.0)
Hemoglobin: 13.3 g/dL (ref 12.0–15.0)
MCH: 26.8 pg (ref 26.0–34.0)
MCHC: 31.8 g/dL (ref 30.0–36.0)
MCV: 84.3 fL (ref 80.0–100.0)
Platelets: 247 10*3/uL (ref 150–400)
RBC: 4.96 MIL/uL (ref 3.87–5.11)
RDW: 13.5 % (ref 11.5–15.5)
WBC: 5.1 10*3/uL (ref 4.0–10.5)
nRBC: 0 % (ref 0.0–0.2)

## 2019-10-01 LAB — BASIC METABOLIC PANEL
Anion gap: 14 (ref 5–15)
BUN: 12 mg/dL (ref 8–23)
CO2: 20 mmol/L — ABNORMAL LOW (ref 22–32)
Calcium: 9.2 mg/dL (ref 8.9–10.3)
Chloride: 96 mmol/L — ABNORMAL LOW (ref 98–111)
Creatinine, Ser: 0.68 mg/dL (ref 0.44–1.00)
GFR calc Af Amer: >60 mL/min (ref >60)
GFR calc non Af Amer: >60 mL/min (ref >60)
Glucose, Bld: 412 mg/dL — ABNORMAL HIGH (ref 70–99)
Potassium: 4.6 mmol/L (ref 3.5–5.1)
Sodium: 130 mmol/L — ABNORMAL LOW (ref 135–145)

## 2019-10-01 LAB — URINALYSIS, ROUTINE W REFLEX MICROSCOPIC
Bacteria, UA: NONE SEEN
Bilirubin Urine: NEGATIVE
Glucose, UA: >=500 mg/dL — AB
Ketones, ur: 20 mg/dL — AB
Leukocytes,Ua: NEGATIVE
Nitrite: NEGATIVE
Protein, ur: NEGATIVE mg/dL
Specific Gravity, Urine: 1.037 — ABNORMAL HIGH (ref 1.005–1.030)
pH: 5 (ref 5.0–8.0)

## 2019-10-01 LAB — CBG MONITORING, ED: Glucose-Capillary: 264 mg/dL — ABNORMAL HIGH (ref 70–99)

## 2019-10-01 MED ORDER — VICTOZA 18 MG/3ML ~~LOC~~ SOPN: 1.2000 mg | PEN_INJECTOR | Freq: Every day | SUBCUTANEOUS | 1 refills | Status: DC

## 2019-10-01 MED ORDER — FENTANYL CITRATE (PF) 100 MCG/2ML IJ SOLN
50.0000 ug | Freq: Once | INTRAMUSCULAR | Status: AC
  Administered 2019-10-01: 50 ug via INTRAVENOUS
  Filled 2019-10-01: qty 2

## 2019-10-01 MED ORDER — SODIUM CHLORIDE 0.9 % IV BOLUS
1000.0000 mL | Freq: Once | INTRAVENOUS | Status: AC
  Administered 2019-10-01: 1000 mL via INTRAVENOUS

## 2019-10-01 MED ORDER — TRAMADOL HCL 50 MG PO TABS: 50.0000 mg | ORAL_TABLET | Freq: Four times a day (QID) | ORAL | 0 refills | Status: DC | PRN

## 2019-10-01 NOTE — Discharge Planning (Signed)
  [place] Medication Assistance Card Name: Raffaella Edison (MRN): [id number] [place]: [id number] RX Group: [id number] Discharge Date: 10/01/2019 Expiration Date:10/09/2019                                           (must be filled within 7 days of discharge)     You have been approved to have the prescriptions written by your discharging physician filled through our [facility] (Medication Assistance Through [facility]) program. This program allows for a one-time (no refills) 34-day supply of selected medications for a low copay amount.  The copay is $3.00 per prescription. For instance, if you have one prescription, you will pay $3.00; for two prescriptions, you pay $6.00; for three prescriptions, you pay $9.00; and so on.  Only certain pharmacies are participating in this program with [facility]. You will need to select one of the pharmacies from the attached list and take your prescriptions, this letter, and your photo ID to one of the participating pharmacies.   We are excited that you are able to use the [facility] program to get your medications. These prescriptions must be filled within 7 days of hospital discharge or they will no longer be valid for the [facility] program. Should you have any problems with your prescriptions please contact your case management team member at [phone] for [facility] [place]/ [place] you, [facility]

## 2019-10-01 NOTE — Discharge Instructions (Signed)
Make sure that you are checking your blood sugars regularly.  Follow-up with the primary care doctor soon as possible.  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  Return to emergency room if you have any worsening symptoms.

## 2019-10-01 NOTE — Discharge Planning (Signed)
RNCM consulted by EDP Belfi for medication assistance for uninsured pt. RNCM reviewed chart and spoke with the pt about [facility] MATCH program ($3 co pay for each Rx through [facility] program, does not include refills, 7 day expiration of [facility] letter and choice of pharmacies). Pt is eligible for [facility] MATCH program (unable to find pt listed in PROCARE per cardholder name inquiry) and has agreed to accept [place] under terms discussed. PROCARE information entered. [place] letter completed and provided to pt.  RNCM updated EDP and ED RN.   RNCM also confirmed that pt does not have PCP. RNCM will place clinic information on pt AVS as PCP offices are not open due to the holiday.

## 2019-10-01 NOTE — ED Triage Notes (Signed)
Pt here for eval of R flank pain and polyuria, burning with urination x 2 days.

## 2019-10-01 NOTE — ED Provider Notes (Signed)
[facility] EMERGENCY DEPARTMENT Provider Note   CSN: [id number] Arrival date & time: 10/01/19  7412     History Chief Complaint  Patient presents with  . Flank Pain    Alisha Williams is a 64 y.o. female.  Patient is a 64 year old female with a history of diabetes and hypothyroidism who presents with flank pain.  She reports a 2 to 3-day history of pain in her right mid back.  She says occasionally it radiates to her mid abdomen but normally stays in her back.  When it gets bad, sometimes it radiates down her right leg.  She denies any numbness or weakness in the leg.  She does say that she has pain that increases in her back when she urinates and she has had frequent urination as well.  She denies any known hematuria.  No burning on urination.  She just says that her back hurts worse when she urinates.  She denies any fevers.  No nausea or vomiting.  No history of recent injuries.  She says it is there all the time but sometimes does get worse with movement.  She is noted to have high blood sugar.  She says that she has been out of her medications for about 3 months because her husband lost his insurance and she has not been able to get into see her PCP.  She is unable to afford her diabetic medication because she says it is too expensive.        Past Medical History:  Diagnosis Date  . Anemia    nos  . Anxiety   . Depression   . Hypothyroidism     Patient Active Problem List   Diagnosis Date Noted  . Diabetes type 2, uncontrolled ([place]) 03/19/2013  . Other and unspecified hyperlipidemia 03/19/2013  . Lower urinary tract symptoms (LUTS) 01/29/2013  . Urinary incontinence 01/29/2013  . HEADACHE 05/19/2009  . NEOPLASM, SKIN, UNCERTAIN BEHAVIOR 87/86/7672  . HYPERGLYCEMIA 04/16/2009  . LIVER FUNCTION TESTS, ABNORMAL 04/16/2009  . PERIMENOPAUSAL STATUS 04/16/2009  . LIPOMA, SKIN 10/03/2006  . Hypothyroidism 08/30/2006  . ANEMIA-NOS 08/30/2006  . ANXIETY  08/30/2006  . DEPRESSION 08/30/2006  . DIABETES MELLITUS, GESTATIONAL 08/30/2006    Past Surgical History:  Procedure Laterality Date  . CESAREAN SECTION    . strectch kidney tube  1970  . TONSILLECTOMY    . TUBAL LIGATION       OB History   No obstetric history on file.     Family History  Problem Relation Age of Onset  . Thyroid disease Mother   . Hypertension Mother   . Arthritis Mother   . Fibromyalgia Mother   . Breast cancer Mother   . Heart attack Father   . Breast cancer Sister   . Thyroid disease Sister     Social History   Tobacco Use  . Smoking status: Never Smoker  Substance Use Topics  . Alcohol use: No  . Drug use: Not on file    Home Medications Prior to Admission medications   Medication Sig Start Date End Date Taking? Authorizing Provider  CINNAMON PO Take by mouth.    [provider]  cyclobenzaprine (FLEXERIL) 5 MG tablet Take 1-2 tablets (5-10 mg total) by mouth 3 (three) times daily as needed for muscle spasms. 11/01/14   Panosh, Standley Brooking, MD  ferrous sulfate 325 (65 FE) MG tablet Take 325 mg by mouth daily with breakfast.      [provider]  glucose blood (ONETOUCH VERIO) test strip 1 each by Other route as needed for other. Use as instructed    [provider]  HYDROcodone-acetaminophen (NORCO/VICODIN) 5-325 MG tablet Take 1 tablet by mouth every 6 (six) hours as needed for severe pain. 11/01/14   Panosh, Standley Brooking, MD  Iodine, Kelp, (KELP PO) Take by mouth.    [provider]  levothyroxine (SYNTHROID, LEVOTHROID) 75 MCG tablet Take 1 tablet (75 mcg total) by mouth daily. 02/06/15   Panosh, Standley Brooking, MD  liraglutide (VICTOZA) 18 MG/3ML SOPN Inject 0.2 mLs (1.2 mg total) into the skin daily. 10/01/19   Malvin Johns, MD  metFORMIN (GLUCOPHAGE-XR) 500 MG 24 hr tablet TAKE 1 TABLET (500 MG TOTAL) BY MOUTH DAILY WITH BREAKFAST. INCREASE TO  2 PER DAY IN 2 WEEKS AND THEN 3 PER DAY IN  4 WEEKS OR AS DIRECTED 08/02/14    Panosh, Standley Brooking, MD  MULTIPLE VITAMIN PO Take 1 tablet by mouth daily.     [provider]  [place] by Does not apply route. Use as instructed    [provider]  predniSONE (DELTASONE) 10 MG tablet Take pills per day,6,6,6,4,4,4,2,2,2,1,1,1 11/01/14   Panosh, Standley Brooking, MD  traMADol (ULTRAM) 50 MG tablet Take 1 tablet (50 mg total) by mouth every 6 (six) hours as needed. 10/01/19   Malvin Johns, MD    Allergies    Patient has no known allergies.  Review of Systems   Review of Systems  Constitutional: Negative for chills, diaphoresis, fatigue and fever.  HENT: Negative for congestion, rhinorrhea and sneezing.   Eyes: Negative.   Respiratory: Negative for cough, chest tightness and shortness of breath.   Cardiovascular: Negative for chest pain and leg swelling.  Gastrointestinal: Negative for abdominal pain, blood in stool, diarrhea, nausea and vomiting.  Genitourinary: Positive for frequency. Negative for difficulty urinating, flank pain and hematuria.  Musculoskeletal: Positive for back pain. Negative for arthralgias.  Skin: Negative for rash.  Neurological: Negative for dizziness, speech difficulty, weakness, numbness and headaches.    Physical Exam Updated Vital Signs BP 140/77 (BP Location: Right Arm)   Pulse 75   Temp 98.4 F (36.9 C) (Oral)   Resp 14   SpO2 100%   Physical Exam Constitutional:      Appearance: She is well-developed.  HENT:     Head: Normocephalic and atraumatic.  Eyes:     Pupils: Pupils are equal, round, and reactive to light.  Cardiovascular:     Rate and Rhythm: Normal rate and regular rhythm.     Heart sounds: Normal heart sounds.  Pulmonary:     Effort: Pulmonary effort is normal. No respiratory distress.     Breath sounds: Normal breath sounds. No wheezing or rales.  Chest:     Chest wall: No tenderness.  Abdominal:     General: Bowel sounds are normal.     Palpations: Abdomen is soft.      Tenderness: There is no abdominal tenderness. There is no guarding or rebound.  Musculoskeletal:        General: Normal range of motion.     Cervical back: Normal range of motion and neck supple.     Comments: Positive tenderness in the right mid back, no spinal tenderness, negative straight leg raise, pedal pulses are intact, she has normal sensation and motor function to the lower extremities bilaterally  Lymphadenopathy:     Cervical: No cervical adenopathy.  Skin:    General:  Skin is warm and dry.     Findings: No rash.  Neurological:     Mental Status: She is alert and oriented to person, place, and time.     ED Results / Procedures / Treatments   Labs (all labs ordered are listed, but only abnormal results are displayed) Labs Reviewed  URINALYSIS, ROUTINE W REFLEX MICROSCOPIC - Abnormal; Notable for the following components:      Result Value   Specific Gravity, Urine 1.037 (*)    Glucose, UA >=500 (*)    Hgb urine dipstick SMALL (*)    Ketones, ur 20 (*)    All other components within normal limits  BASIC METABOLIC PANEL - Abnormal; Notable for the following components:   Sodium 130 (*)    Chloride 96 (*)    CO2 20 (*)    Glucose, Bld 412 (*)    All other components within normal limits  CBG MONITORING, ED - Abnormal; Notable for the following components:   Glucose-Capillary 264 (*)    All other components within normal limits  URINE CULTURE  CBC    EKG None  Radiology CT Renal Stone Study  Result Date: 10/01/2019 CLINICAL DATA:  Right flank pain, polyuria and dysuria for 2 days. EXAM: CT ABDOMEN AND PELVIS WITHOUT CONTRAST TECHNIQUE: Multidetector CT imaging of the abdomen and pelvis was performed following the standard protocol without IV contrast. COMPARISON:  Abdominal ultrasound 01/12/2019 FINDINGS: Lower chest: Mild linear scarring or atelectasis in the right middle lobe and lingula. Coronary artery atherosclerosis and a small hiatal hernia are noted. There is  no significant pleural or pericardial effusion. Hepatobiliary: The liver demonstrates diffusely decreased density consistent with steatosis. No focal hepatic abnormalities are identified on noncontrast imaging. There are calcified gallstones, measuring up to 19 mm in diameter. No evidence of gallbladder thickening or biliary dilatation. Pancreas: Unremarkable. No pancreatic ductal dilatation or surrounding inflammatory changes. Spleen: There is an ill-defined low-density lesion anteriorly in the spleen measuring up to 2.0 cm on image 20/3. This is incompletely characterized by this noncontrast study. This was not seen on previous ultrasound. The spleen is normal in size without other focal abnormality. Adrenals/Urinary Tract: Both adrenal glands appear normal. 4 mm nonobstructing calculus in the lower pole of the left kidney. No other urinary tract calculi are demonstrated. There is no hydronephrosis or perinephric soft tissue stranding. The bladder appears unremarkable for its degree of distention. Stomach/Bowel: No evidence of bowel wall thickening, distention or surrounding inflammatory change. The appendix appears normal. Vascular/Lymphatic: There are no enlarged abdominal or pelvic lymph nodes. Minimal aortoiliac atherosclerosis. No acute vascular findings on noncontrast imaging. Reproductive: There is a large peripherally calcified uterine fibroid, measuring up to 7.2 cm in diameter. No adnexal mass. Other: Small umbilical hernia containing only fat. No ascites or free air. Musculoskeletal: No acute or significant osseous findings. IMPRESSION: 1. No acute findings or explanation for the patient's symptoms. 2. Nonobstructing 4 mm calculus in the lower pole of the left kidney. No evidence of ureteral calculus or hydronephrosis. The bladder appears unremarkable. 3. Hepatic steatosis. Cholelithiasis without evidence of cholecystitis or biliary dilatation. 4. Indeterminate ill-defined low-density lesion anteriorly  in the spleen, incompletely characterized by this noncontrast study. Without a history of malignancy, this finding is of doubtful significance. Consider further evaluation with abdominal MRI without and with contrast within 6 months. This recommendation follows ACR consensus guidelines: White Paper of the ACR Incidental Findings Committee II on Splenic and Nodal Findings. [place] [id number].  5. Large peripherally calcified uterine fibroid. 6. Aortic Atherosclerosis (ICD10-I70.0). Electronically Signed   By: Richardean Sale M.D.   On: 10/01/2019 12:46    Procedures Procedures (including critical care time)  Medications Ordered in ED Medications  fentaNYL (SUBLIMAZE) injection 50 mcg (50 mcg Intravenous Given 10/01/19 1250)  sodium chloride 0.9 % bolus 1,000 mL (0 mLs Intravenous Stopped 10/01/19 1451)    ED Course  I have reviewed the triage vital signs and the nursing notes.  Pertinent labs & imaging results that were available during my care of the patient were reviewed by me and considered in my medical decision making (see chart for details).    MDM Rules/Calculators/A&P                          Patient is a 64 year old female who presents with right side back pain.  It has some musculoskeletal component but also seen to get worse when she urinates.  There is no neurologic deficits or signs of cauda equina.  She does not have any midline spinal tenderness.  Her urinalysis does not appear to be infected.  I did a CT scan to assess for kidney stone and this was negative for a ureteral stone.  There is a left side stone in the kidney.  She also had a splenic nodule that will need outpatient follow-up.  These findings were discussed with the patient.  Her urine was sent for culture but it does not appear to be consistent with infection.  I feel like her back pain is likely musculoskeletal given the absence of other findings.  We discussed trying a short course of tramadol.  She has  muscle relaxers at home and she is already using Tylenol and ibuprofen.  Her other issue is uncontrolled diabetes.  Her blood sugar was markedly elevated with a resulting hyponatremia on her lab work.  She does not have suggestions of DKA.  Her anion gap is normal with no acidosis.  She has been out of her medications for several months.  Case management has made a match program available where she can get her medications at the local pharmacy.  They are arranging outpatient follow-up.  I did call the pharmacy and verify that she was on Victoza.  I have given her prescription for a supply of Victoza as well as a short course of tramadol.  She does not have any other chronic medicines that she needs refills on.  She advised to follow-up with primary care.  Return precautions were given. Final Clinical Impression(s) / ED Diagnoses Final diagnoses:  Acute right-sided low back pain without sciatica  Nodule of spleen    Rx / DC Orders ED Discharge Orders         Ordered    liraglutide (VICTOZA) 18 MG/3ML SOPN  Daily        10/01/19 1440    traMADol (ULTRAM) 50 MG tablet  Every 6 hours PRN        10/01/19 1440           Malvin Johns, MD 10/01/19 1458

## 2019-10-02 LAB — URINE CULTURE

## 2019-10-02 NOTE — Progress Notes (Signed)
[facility] Encounter   10/02/19 1648  TOC ED Mini Assessment  TOC Time spent with patient (minutes): 20  PING Used in TOC Assessment No  Admission or Readmission Diverted No  Interventions which prevented an admission or readmission Medication Review  ED CM received call from ED CM concerning patient's discharge medication. CM assisted with [place] letter and sent to pharmacy.

## 2021-02-20 IMAGING — CT CT RENAL STONE PROTOCOL
2 of 4 series · 15 of 46 positions shown, 17 images · non-contrast
Comparison: Abdominal ultrasound 01/12/2019

CLINICAL DATA: Right flank pain, polyuria and dysuria for 2 days.

EXAM:
CT ABDOMEN AND PELVIS WITHOUT CONTRAST
TECHNIQUE: Multidetector CT imaging of the abdomen and pelvis was performed
following the standard protocol without IV contrast.

[Series 3: ap without · axial · non-contrast · 0.90mm/px · z∈[-515,-60]mm · 12 of 105 slices shown, 14 images]
[im 7/105  soft-tissue]
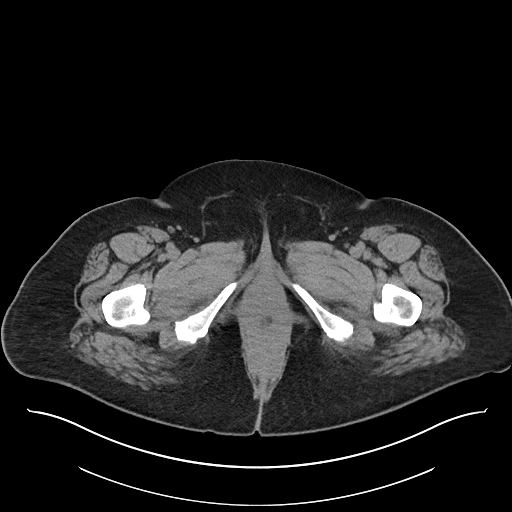
[im 7/105  bone]
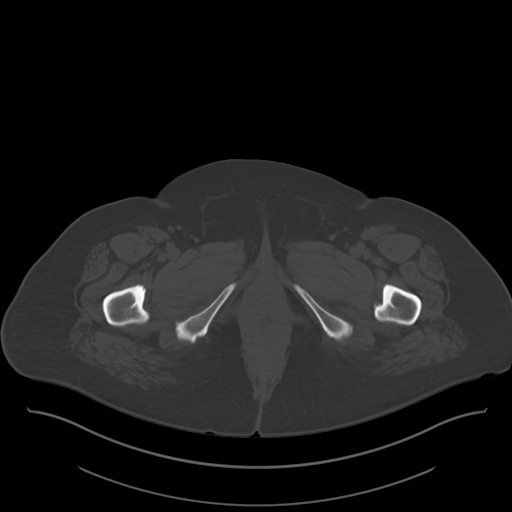
[im 19/105  soft-tissue]
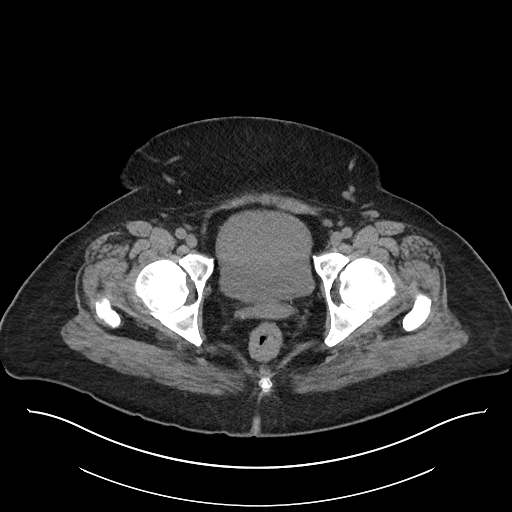
[im 25/105  soft-tissue]
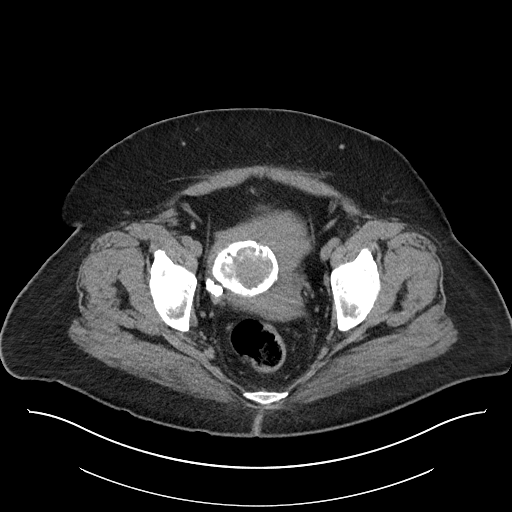
[im 31/105  soft-tissue]
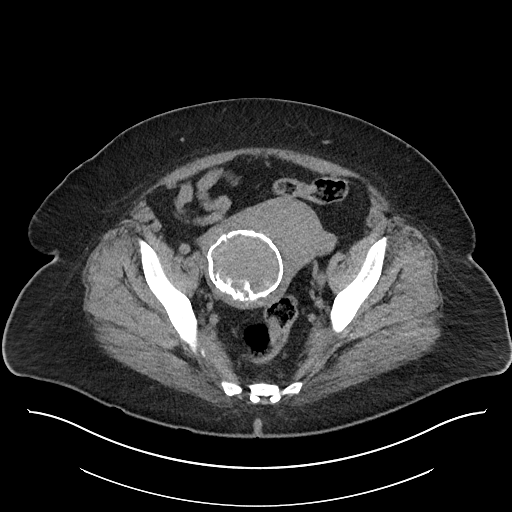
[im 43/105  soft-tissue]
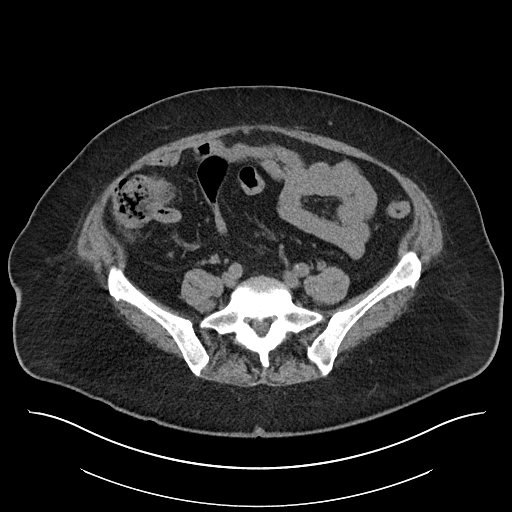
[im 49/105  soft-tissue]
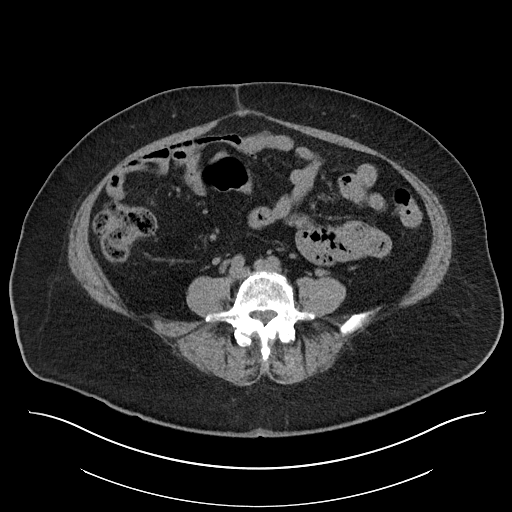
[im 56/105  soft-tissue]
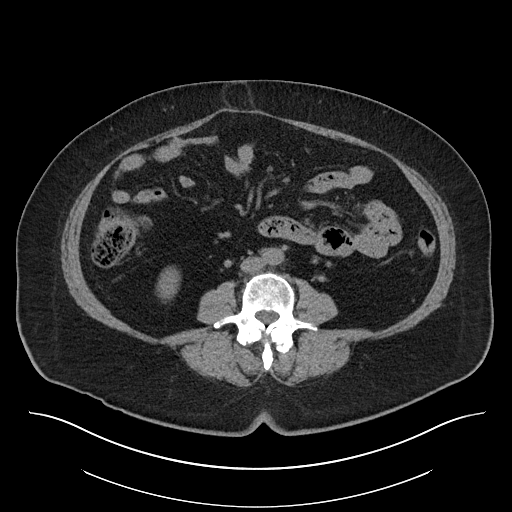
[im 68/105  soft-tissue]
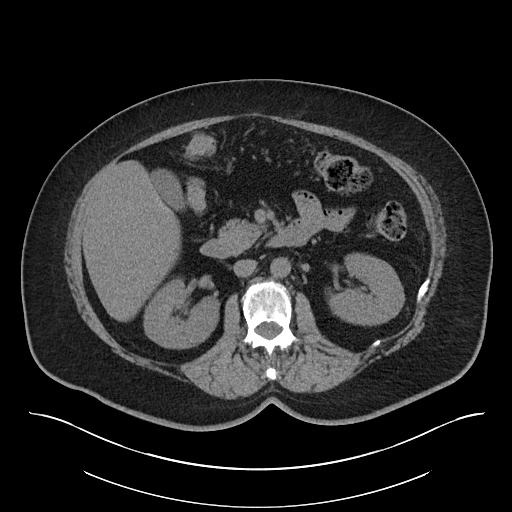
[im 74/105  soft-tissue]
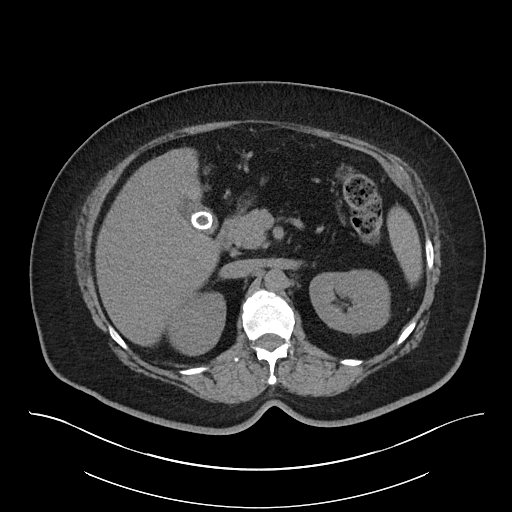
[im 74/105  bone]
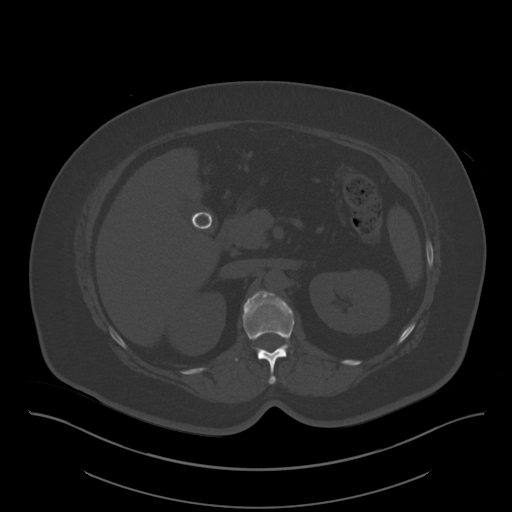
[im 80/105  soft-tissue]
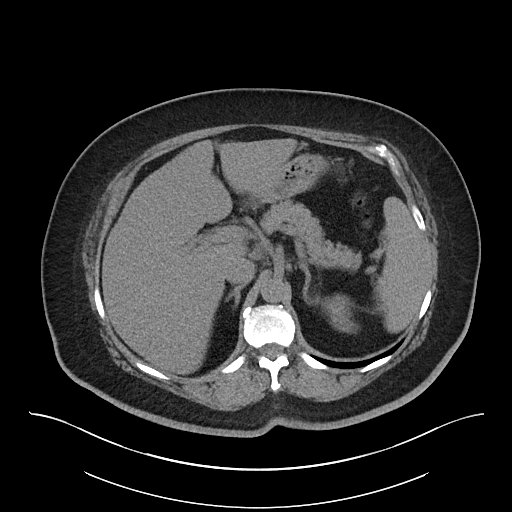
[im 92/105  soft-tissue]
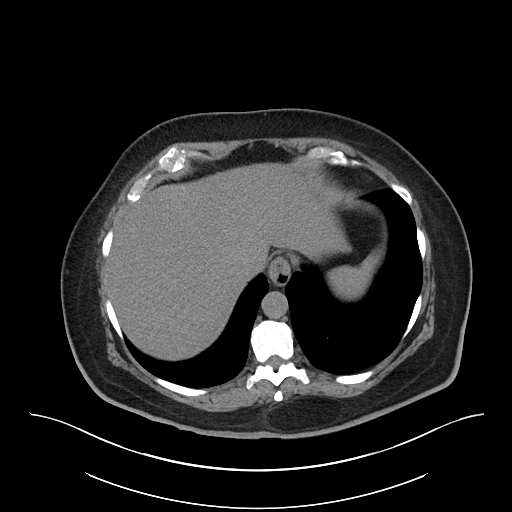
[im 98/105  soft-tissue]
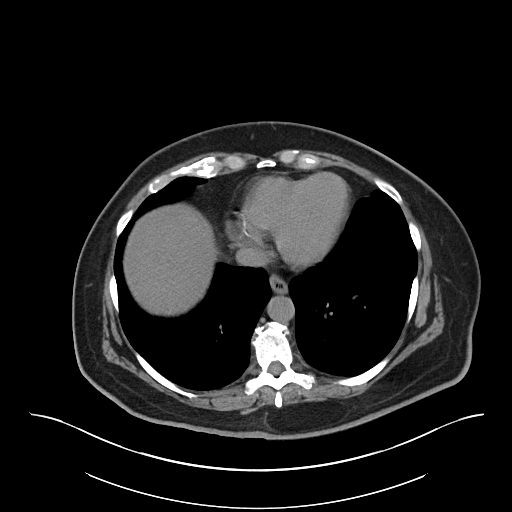

[Series 6: cor · coronal · 0.94mm/px · 3 of 110 slices shown]
[im 37/110  soft-tissue]
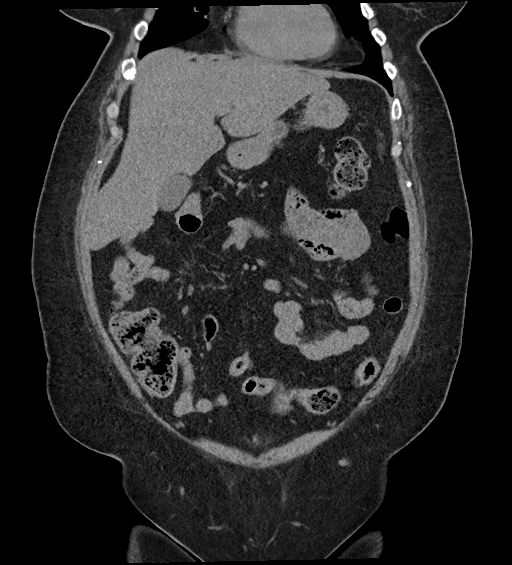
[im 49/110  soft-tissue]
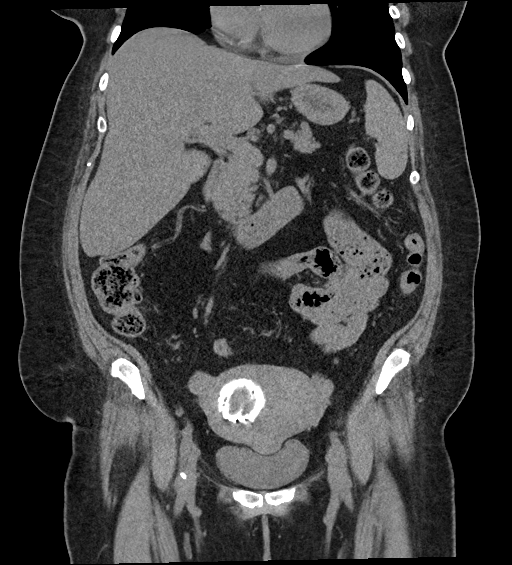
[im 61/110  soft-tissue]
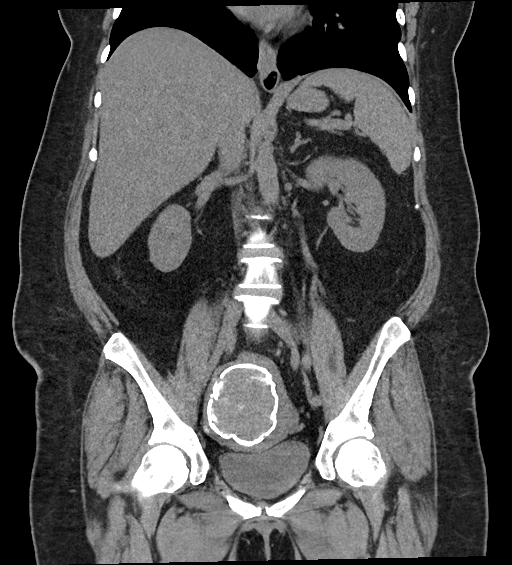

[15 of 46 positions shown; findings below may reference images not displayed]

FINDINGS: Lower chest: Mild linear scarring or atelectasis in the right middle
lobe and lingula. Coronary artery atherosclerosis and a small hiatal
hernia are noted. There is no significant pleural or pericardial
effusion.

Hepatobiliary: The liver demonstrates diffusely decreased density
consistent with steatosis. No focal hepatic abnormalities are
identified on noncontrast imaging. There are calcified gallstones,
measuring up to 19 mm in diameter. No evidence of gallbladder
thickening or biliary dilatation.

Pancreas: Unremarkable. No pancreatic ductal dilatation or
surrounding inflammatory changes.

Spleen: There is an ill-defined low-density lesion anteriorly in the
spleen measuring up to 2.0 cm on image [DATE]. This is incompletely
characterized by this noncontrast study. This was not seen on
previous ultrasound. The spleen is normal in size without other
focal abnormality.

Adrenals/Urinary Tract: Both adrenal glands appear normal. 4 mm
nonobstructing calculus in the lower pole of the left kidney. No
other urinary tract calculi are demonstrated. There is no
hydronephrosis or perinephric soft tissue stranding. The bladder
appears unremarkable for its degree of distention.

Stomach/Bowel: No evidence of bowel wall thickening, distention or
surrounding inflammatory change. The appendix appears normal.

Vascular/Lymphatic: There are no enlarged abdominal or pelvic lymph
nodes. Minimal aortoiliac atherosclerosis. No acute vascular
findings on noncontrast imaging.

Reproductive: There is a large peripherally calcified uterine
fibroid, measuring up to 7.2 cm in diameter. No adnexal mass.

Other: Small umbilical hernia containing only fat. No ascites or
free air.

Musculoskeletal: No acute or significant osseous findings.
IMPRESSION: 1. No acute findings or explanation for the patient's symptoms.
2. Nonobstructing 4 mm calculus in the lower pole of the left
kidney. No evidence of ureteral calculus or hydronephrosis. The
bladder appears unremarkable.
3. Hepatic steatosis. Cholelithiasis without evidence of
cholecystitis or biliary dilatation.
4. Indeterminate ill-defined low-density lesion anteriorly in the
spleen, incompletely characterized by this noncontrast study.
Without a history of malignancy, this finding is of doubtful
significance. Consider further evaluation with abdominal MRI without
and with contrast within 6 months. This recommendation follows ACR
consensus guidelines: White Paper of the ACR Incidental Findings
Committee II on Splenic and Nodal Findings. [HOSPITAL]
5. Large peripherally calcified uterine fibroid.
6. Aortic Atherosclerosis ([id number]).

## 2022-01-13 ENCOUNTER — Emergency Department (HOSPITAL_COMMUNITY): Payer: BC Managed Care – PPO

## 2022-01-13 ENCOUNTER — Emergency Department (HOSPITAL_COMMUNITY)
Admission: EM | Admit: 2022-01-13 | Discharge: 2022-01-14 | Disposition: A | Discharge status: Home / Self Care | Payer: BC Managed Care – PPO | Attending: Emergency Medicine | Admitting: Emergency Medicine

## 2022-01-13 ENCOUNTER — Encounter (HOSPITAL_COMMUNITY): Payer: Self-pay | Admitting: Emergency Medicine

## 2022-01-13 DIAGNOSIS — Z79899 Other long term (current) drug therapy: Secondary | ICD-10-CM | POA: Diagnosis not present

## 2022-01-13 DIAGNOSIS — I10 Essential (primary) hypertension: Secondary | ICD-10-CM | POA: Diagnosis not present

## 2022-01-13 DIAGNOSIS — Z7984 Long term (current) use of oral hypoglycemic drugs: Secondary | ICD-10-CM | POA: Insufficient documentation

## 2022-01-13 DIAGNOSIS — E1165 Type 2 diabetes mellitus with hyperglycemia: Secondary | ICD-10-CM | POA: Diagnosis not present

## 2022-01-13 DIAGNOSIS — G510 Bell's palsy: Secondary | ICD-10-CM | POA: Diagnosis not present

## 2022-01-13 DIAGNOSIS — R29810 Facial weakness: Secondary | ICD-10-CM | POA: Diagnosis present

## 2022-01-13 HISTORY — DX: Type 2 diabetes mellitus without complications: E11.9

## 2022-01-13 LAB — COMPREHENSIVE METABOLIC PANEL
ALT: 37 U/L (ref 0–44)
AST: 42 U/L — ABNORMAL HIGH (ref 15–41)
Albumin: 4.1 g/dL (ref 3.5–5.0)
Alkaline Phosphatase: 77 U/L (ref 38–126)
Anion gap: 11 (ref 5–15)
BUN: 11 mg/dL (ref 8–23)
CO2: 25 mmol/L (ref 22–32)
Calcium: 9.5 mg/dL (ref 8.9–10.3)
Chloride: 97 mmol/L — ABNORMAL LOW (ref 98–111)
Creatinine, Ser: 0.91 mg/dL (ref 0.44–1.00)
GFR, Estimated: >60 mL/min (ref >60)
Glucose, Bld: 364 mg/dL — ABNORMAL HIGH (ref 70–99)
Potassium: 4.5 mmol/L (ref 3.5–5.1)
Sodium: 133 mmol/L — ABNORMAL LOW (ref 135–145)
Total Bilirubin: 0.9 mg/dL (ref 0.3–1.2)
Total Protein: 7.7 g/dL (ref 6.5–8.1)

## 2022-01-13 LAB — CBC
HCT: 43.9 % (ref 36.0–46.0)
Hemoglobin: 14.3 g/dL (ref 12.0–15.0)
MCH: 27.2 pg (ref 26.0–34.0)
MCHC: 32.6 g/dL (ref 30.0–36.0)
MCV: 83.6 fL (ref 80.0–100.0)
Platelets: 268 10*3/uL (ref 150–400)
RBC: 5.25 MIL/uL — ABNORMAL HIGH (ref 3.87–5.11)
RDW: 13.7 % (ref 11.5–15.5)
WBC: 5.8 10*3/uL (ref 4.0–10.5)
nRBC: 0 % (ref 0.0–0.2)

## 2022-01-13 LAB — I-STAT CHEM 8, ED
BUN: 14 mg/dL (ref 8–23)
Calcium, Ion: 1.18 mmol/L (ref 1.15–1.40)
Chloride: 96 mmol/L — ABNORMAL LOW (ref 98–111)
Creatinine, Ser: 0.6 mg/dL (ref 0.44–1.00)
Glucose, Bld: 376 mg/dL — ABNORMAL HIGH (ref 70–99)
HCT: 46 % (ref 36.0–46.0)
Hemoglobin: 15.6 g/dL — ABNORMAL HIGH (ref 12.0–15.0)
Potassium: 4.3 mmol/L (ref 3.5–5.1)
Sodium: 133 mmol/L — ABNORMAL LOW (ref 135–145)
TCO2: 26 mmol/L (ref 22–32)

## 2022-01-13 LAB — URINALYSIS, ROUTINE W REFLEX MICROSCOPIC
Bacteria, UA: NONE SEEN
Bilirubin Urine: NEGATIVE
Glucose, UA: >=500 mg/dL — AB
Hgb urine dipstick: NEGATIVE
Ketones, ur: 5 mg/dL — AB
Leukocytes,Ua: NEGATIVE
Nitrite: NEGATIVE
Protein, ur: NEGATIVE mg/dL
Specific Gravity, Urine: 1.032 — ABNORMAL HIGH (ref 1.005–1.030)
pH: 6 (ref 5.0–8.0)

## 2022-01-13 LAB — DIFFERENTIAL
Abs Immature Granulocytes: 0.02 10*3/uL (ref 0.00–0.07)
Basophils Absolute: 0 10*3/uL (ref 0.0–0.1)
Basophils Relative: 0 %
Eosinophils Absolute: 0.1 10*3/uL (ref 0.0–0.5)
Eosinophils Relative: 2 %
Immature Granulocytes: 0 %
Lymphocytes Relative: 56 %
Lymphs Abs: 3.2 10*3/uL (ref 0.7–4.0)
Monocytes Absolute: 0.4 10*3/uL (ref 0.1–1.0)
Monocytes Relative: 7 %
Neutro Abs: 2 10*3/uL (ref 1.7–7.7)
Neutrophils Relative %: 35 %

## 2022-01-13 LAB — RAPID URINE DRUG SCREEN, HOSP PERFORMED
Amphetamines: NOT DETECTED
Barbiturates: NOT DETECTED
Benzodiazepines: NOT DETECTED
Cocaine: NOT DETECTED
Opiates: NOT DETECTED
Tetrahydrocannabinol: NOT DETECTED

## 2022-01-13 LAB — PROTIME-INR
INR: 1.1 (ref 0.8–1.2)
Prothrombin Time: 13.9 seconds (ref 11.4–15.2)

## 2022-01-13 LAB — ETHANOL: Alcohol, Ethyl (B): <10 mg/dL (ref <10)

## 2022-01-13 LAB — CBG MONITORING, ED
Glucose-Capillary: 337 mg/dL — ABNORMAL HIGH (ref 70–99)
Glucose-Capillary: 273 mg/dL — ABNORMAL HIGH (ref 70–99)

## 2022-01-13 LAB — APTT: aPTT: 26 seconds (ref 24–36)

## 2022-01-13 NOTE — ED Provider Triage Note (Signed)
Emergency Medicine Provider Triage Evaluation Note  Alisha Williams , a 66 y.o. female  was evaluated in triage.  Pt complains of dysarthria starting around 1030 to 11 AM this morning.  She is also having left-sided facial droop.  No blurry vision, diplopia, lateralized weakness or numbness.  She does endorse a headache which she went to bed with last night and has been present since then.  Denies any history of strokes, not on blood thinners..  Review of Systems  Per HPI  Physical Exam  BP (!) 158/107   Pulse 94   Temp 98.1 F (36.7 C) (Oral)   Resp 16   SpO2 96%  Gen:   Awake, no distress   Resp:  Normal effort  MSK:   Moves extremities without difficulty  Other:  Left-sided facial droop.  Slight dysarthria.  Eyebrow raise symmetric, sensation to light touch subjectively last to the left face.  Normal finger-nose.  No pronator drift.  Upper and lower extremity strength 5/5 against resistance.  Gait is intact.    Medical Decision Making  Medically screening exam initiated at 3:37 PM.  Appropriate orders placed.  Alisha Williams was informed that the remainder of the evaluation will be completed by another provider, this initial triage assessment does not replace that evaluation, and the importance of remaining in the ED until their evaluation is complete.  Outside of window to activate as a code stroke.  Van negative.  Will initiate TIA workup.   Sherrill Raring, PA-C 01/13/22 1540

## 2022-01-13 NOTE — ED Triage Notes (Signed)
Pt reports going to bed with a headache last night. Woke up with mild headache. Pt noticed she was slurring her words today around 11 am. No weakness or vision changes noted. Ambulatory to triage.

## 2022-01-14 LAB — CBG MONITORING, ED: Glucose-Capillary: 330 mg/dL — ABNORMAL HIGH (ref 70–99)

## 2022-01-14 MED ORDER — ACYCLOVIR 400 MG PO TABS: 400.0000 mg | ORAL_TABLET | Freq: Four times a day (QID) | ORAL | 0 refills | Status: AC

## 2022-01-14 MED ORDER — PREDNISONE 5 MG PO TABS
30.0000 mg | ORAL_TABLET | Freq: Once | ORAL | Status: AC
  Administered 2022-01-14: 30 mg via ORAL
  Filled 2022-01-14: qty 2

## 2022-01-14 MED ORDER — PREDNISONE 10 MG PO TABS: 10.0000 mg | ORAL_TABLET | Freq: Every day | ORAL | 0 refills | Status: DC

## 2022-01-14 MED ORDER — ACYCLOVIR 200 MG PO CAPS
400.0000 mg | ORAL_CAPSULE | Freq: Once | ORAL | Status: AC
  Administered 2022-01-14: 400 mg via ORAL
  Filled 2022-01-14: qty 2

## 2022-01-14 NOTE — ED Notes (Signed)
Pt name called for updated vitals, no response 

## 2022-01-14 NOTE — ED Provider Notes (Signed)
[facility] EMERGENCY DEPARTMENT Provider Note   CSN: [id number] Arrival date & time: 01/13/22  1524     History  Chief Complaint  Patient presents with   Facial Droop    Alisha Williams is a 66 y.o. female.  66 year old female that woke up this morning and noticed that she had right facial droop and her speech was not as clear as normal.  Presented to the ER and feels about the same as when she got here.  She has history of Bell's palsy and says this could be similar however with that she had more eye issues.  No extremity weakness or paresthesias.  She did have a bit of a headache but no other associated symptoms.  No history of stroke.  Does have a history of diabetes and hypertension.        Home Medications Prior to Admission medications   Medication Sig Start Date End Date Taking? Authorizing Provider  acyclovir (ZOVIRAX) 400 MG tablet Take 1 tablet (400 mg total) by mouth 4 (four) times daily for 7 days. 01/14/22 01/21/22 Yes Conleigh Heinlein, Corene Cornea, MD  predniSONE (DELTASONE) 10 MG tablet Take 1 tablet (10 mg total) by mouth daily. Take 3 tablets a day for 5 days - then Take 2 tablets a day for 5 days - then Take 1 tablet a day for 5 days 01/14/22  Yes Aneesh Faller, Corene Cornea, MD  CINNAMON PO Take by mouth.    [provider]  cyclobenzaprine (FLEXERIL) 5 MG tablet Take 1-2 tablets (5-10 mg total) by mouth 3 (three) times daily as needed for muscle spasms. 11/01/14   Panosh, Standley Brooking, MD  ferrous sulfate 325 (65 FE) MG tablet Take 325 mg by mouth daily with breakfast.      [provider]  glucose blood (ONETOUCH VERIO) test strip 1 each by Other route as needed for other. Use as instructed    [provider]  HYDROcodone-acetaminophen (NORCO/VICODIN) 5-325 MG tablet Take 1 tablet by mouth every 6 (six) hours as needed for severe pain. 11/01/14   Panosh, Standley Brooking, MD  Iodine, Kelp, (KELP PO) Take by mouth.    [provider]  levothyroxine  (SYNTHROID, LEVOTHROID) 75 MCG tablet Take 1 tablet (75 mcg total) by mouth daily. 02/06/15   Panosh, Standley Brooking, MD  liraglutide (VICTOZA) 18 MG/3ML SOPN Inject 0.2 mLs (1.2 mg total) into the skin daily. 10/01/19   Malvin Johns, MD  metFORMIN (GLUCOPHAGE-XR) 500 MG 24 hr tablet TAKE 1 TABLET (500 MG TOTAL) BY MOUTH DAILY WITH BREAKFAST. INCREASE TO  2 PER DAY IN 2 WEEKS AND THEN 3 PER DAY IN  4 WEEKS OR AS DIRECTED 08/02/14   Panosh, Standley Brooking, MD  MULTIPLE VITAMIN PO Take 1 tablet by mouth daily.     [provider]  [place] by Does not apply route. Use as instructed    [provider]  traMADol (ULTRAM) 50 MG tablet Take 1 tablet (50 mg total) by mouth every 6 (six) hours as needed. 10/01/19   Malvin Johns, MD      Allergies    Patient has no known allergies.    Review of Systems   Review of Systems  Physical Exam Updated Vital Signs BP 122/68 (BP Location: Right Arm)   Pulse 88   Temp (!) 97.2 F (36.2 C) (Oral)   Resp 18   SpO2 97%  Physical Exam Vitals and nursing note reviewed.  Constitutional:  Appearance: She is well-developed.  HENT:     Head: Normocephalic and atraumatic.     Mouth/Throat:     Mouth: Mucous membranes are moist.     Pharynx: Oropharynx is clear.  Cardiovascular:     Rate and Rhythm: Normal rate and regular rhythm.  Pulmonary:     Effort: No respiratory distress.     Breath sounds: No stridor.  Abdominal:     General: There is no distension.  Musculoskeletal:        General: No swelling or tenderness. Normal range of motion.     Cervical back: Normal range of motion.  Skin:    General: Skin is warm and dry.  Neurological:     Mental Status: She is alert.     Motor: No weakness.     Comments: Right facial droop Eyebrow and forehead wrinkling symmetric Can't hold right eye open as well as left     ED Results / Procedures / Treatments   Labs (all labs ordered are listed, but only abnormal results are  displayed) Labs Reviewed  CBC - Abnormal; Notable for the following components:      Result Value   RBC 5.25 (*)    All other components within normal limits  COMPREHENSIVE METABOLIC PANEL - Abnormal; Notable for the following components:   Sodium 133 (*)    Chloride 97 (*)    Glucose, Bld 364 (*)    AST 42 (*)    All other components within normal limits  URINALYSIS, ROUTINE W REFLEX MICROSCOPIC - Abnormal; Notable for the following components:   Specific Gravity, Urine 1.032 (*)    Glucose, UA >=500 (*)    Ketones, ur 5 (*)    All other components within normal limits  I-STAT CHEM 8, ED - Abnormal; Notable for the following components:   Sodium 133 (*)    Chloride 96 (*)    Glucose, Bld 376 (*)    Hemoglobin 15.6 (*)    All other components within normal limits  CBG MONITORING, ED - Abnormal; Notable for the following components:   Glucose-Capillary 337 (*)    All other components within normal limits  CBG MONITORING, ED - Abnormal; Notable for the following components:   Glucose-Capillary 273 (*)    All other components within normal limits  CBG MONITORING, ED - Abnormal; Notable for the following components:   Glucose-Capillary 330 (*)    All other components within normal limits  ETHANOL  PROTIME-INR  APTT  DIFFERENTIAL  RAPID URINE DRUG SCREEN, HOSP PERFORMED    EKG None  Radiology MR BRAIN WO CONTRAST  Result Date: 01/13/2022 CLINICAL DATA:  TIA. EXAM: MRI HEAD WITHOUT CONTRAST TECHNIQUE: Multiplanar, multiecho pulse sequences of the brain and surrounding structures were obtained without intravenous contrast. COMPARISON:  CT head 01/13/2022 FINDINGS: Brain: No acute infarction, hemorrhage, hydrocephalus, extra-axial collection or mass lesion. Mild white matter changes with scattered small deep white matter hyperintensities bilaterally. Brainstem and cerebellum normal. Vascular: Normal arterial flow voids Skull and upper cervical spine: No focal skeletal  abnormality Sinuses/Orbits: Paranasal sinuses clear. Mild mastoid effusion bilaterally. Bilateral cataract extraction Other: None IMPRESSION: No acute abnormality. Mild white matter changes consistent with chronic microvascular ischemia. Electronically Signed   By: Franchot Gallo M.D.   On: 01/13/2022 18:07   CT HEAD WO CONTRAST  Result Date: 01/13/2022 CLINICAL DATA:  Transient ischemic attack (TIA) EXAM: CT HEAD WITHOUT CONTRAST TECHNIQUE: Contiguous axial images were obtained from the base of the skull through  the vertex without intravenous contrast. RADIATION DOSE REDUCTION: This exam was performed according to the departmental dose-optimization program which includes automated exposure control, adjustment of the mA and/or kV according to patient size and/or use of iterative reconstruction technique. COMPARISON:  05/19/2009 FINDINGS: Brain: No evidence of acute infarction, hemorrhage, hydrocephalus, extra-axial collection or mass lesion/mass effect. Vascular: No hyperdense vessel or unexpected calcification. Skull: Normal. Negative for fracture or focal lesion. Sinuses/Orbits: No acute finding. IMPRESSION: No acute intracranial process. Electronically Signed   By: Sammie Bench M.D.   On: 01/13/2022 16:25    Procedures Procedures    Medications Ordered in ED Medications  predniSONE (DELTASONE) tablet 30 mg (30 mg Oral Given 01/14/22 0507)  acyclovir (ZOVIRAX) 200 MG capsule 400 mg (400 mg Oral Given 01/14/22 0507)    ED Course/ Medical Decision Making/ A&P                           Medical Decision Making Risk Prescription drug management.   Workup here is negative.  No evidence of acute stroke.  I do suspect that she has Bell's palsy.  Patient is hesitant to take full dose prednisone taper so we will do half secondary to hyperglycemia.  Will also do antivirals although it is unclear whether or not she actually had a viral infection.  Her eye does not close all the way on the right so  no need for eye guard.  She will follow-up with her PCP for further management.  Final Clinical Impression(s) / ED Diagnoses Final diagnoses:  Bell's palsy    Rx / DC Orders ED Discharge Orders          Ordered    predniSONE (DELTASONE) 10 MG tablet  Daily        01/14/22 0435    acyclovir (ZOVIRAX) 400 MG tablet  4 times daily        01/14/22 0435              Janeva Peaster, Corene Cornea, MD 01/14/22 [phone]

## 2022-01-14 NOTE — ED Notes (Signed)
Pt alert and oriented, speech is appropriate and easy to understand with very mild slur. Left eye and mouth droop noted, no other symptoms noted or reported. Pt reports history of Bell's Palsy. A&Ox4. GCS 15.

## 2022-06-14 ENCOUNTER — Encounter: Payer: Self-pay | Admitting: "Endocrinology

## 2022-07-13 ENCOUNTER — Inpatient Hospital Stay (HOSPITAL_COMMUNITY)
Admission: EM | Admit: 2022-07-13 | Discharge: 2022-07-26 | DRG: 233 | Disposition: A | Discharge status: Home / Self Care | Payer: 59 | Source: Other Acute Inpatient Hospital | Attending: Thoracic Surgery (Cardiothoracic Vascular Surgery) | Admitting: Thoracic Surgery (Cardiothoracic Vascular Surgery)

## 2022-07-13 ENCOUNTER — Encounter (HOSPITAL_COMMUNITY): Payer: Self-pay

## 2022-07-13 DIAGNOSIS — N390 Urinary tract infection, site not specified: Secondary | ICD-10-CM | POA: Diagnosis not present

## 2022-07-13 DIAGNOSIS — Z87440 Personal history of urinary (tract) infections: Secondary | ICD-10-CM

## 2022-07-13 DIAGNOSIS — I051 Rheumatic mitral insufficiency: Secondary | ICD-10-CM | POA: Diagnosis present

## 2022-07-13 DIAGNOSIS — J9811 Atelectasis: Secondary | ICD-10-CM | POA: Diagnosis not present

## 2022-07-13 DIAGNOSIS — E782 Mixed hyperlipidemia: Secondary | ICD-10-CM | POA: Diagnosis not present

## 2022-07-13 DIAGNOSIS — I5031 Acute diastolic (congestive) heart failure: Secondary | ICD-10-CM | POA: Diagnosis present

## 2022-07-13 DIAGNOSIS — Z01810 Encounter for preprocedural cardiovascular examination: Secondary | ICD-10-CM | POA: Diagnosis not present

## 2022-07-13 DIAGNOSIS — Z832 Family history of diseases of the blood and blood-forming organs and certain disorders involving the immune mechanism: Secondary | ICD-10-CM | POA: Diagnosis not present

## 2022-07-13 DIAGNOSIS — E785 Hyperlipidemia, unspecified: Secondary | ICD-10-CM | POA: Diagnosis present

## 2022-07-13 DIAGNOSIS — D696 Thrombocytopenia, unspecified: Secondary | ICD-10-CM | POA: Diagnosis not present

## 2022-07-13 DIAGNOSIS — E1110 Type 2 diabetes mellitus with ketoacidosis without coma: Secondary | ICD-10-CM | POA: Diagnosis present

## 2022-07-13 DIAGNOSIS — N3000 Acute cystitis without hematuria: Secondary | ICD-10-CM | POA: Diagnosis not present

## 2022-07-13 DIAGNOSIS — Z79899 Other long term (current) drug therapy: Secondary | ICD-10-CM

## 2022-07-13 DIAGNOSIS — Z951 Presence of aortocoronary bypass graft: Secondary | ICD-10-CM

## 2022-07-13 DIAGNOSIS — I2510 Atherosclerotic heart disease of native coronary artery without angina pectoris: Secondary | ICD-10-CM | POA: Diagnosis present

## 2022-07-13 DIAGNOSIS — K5900 Constipation, unspecified: Secondary | ICD-10-CM | POA: Diagnosis present

## 2022-07-13 DIAGNOSIS — E669 Obesity, unspecified: Secondary | ICD-10-CM | POA: Diagnosis present

## 2022-07-13 DIAGNOSIS — Z8249 Family history of ischemic heart disease and other diseases of the circulatory system: Secondary | ICD-10-CM

## 2022-07-13 DIAGNOSIS — E119 Type 2 diabetes mellitus without complications: Secondary | ICD-10-CM | POA: Diagnosis not present

## 2022-07-13 DIAGNOSIS — E1310 Other specified diabetes mellitus with ketoacidosis without coma: Secondary | ICD-10-CM | POA: Diagnosis not present

## 2022-07-13 DIAGNOSIS — Z803 Family history of malignant neoplasm of breast: Secondary | ICD-10-CM | POA: Diagnosis not present

## 2022-07-13 DIAGNOSIS — I214 Non-ST elevation (NSTEMI) myocardial infarction: Secondary | ICD-10-CM | POA: Diagnosis present

## 2022-07-13 DIAGNOSIS — Z7984 Long term (current) use of oral hypoglycemic drugs: Secondary | ICD-10-CM | POA: Diagnosis not present

## 2022-07-13 DIAGNOSIS — I509 Heart failure, unspecified: Secondary | ICD-10-CM | POA: Diagnosis not present

## 2022-07-13 DIAGNOSIS — R079 Chest pain, unspecified: Secondary | ICD-10-CM | POA: Diagnosis present

## 2022-07-13 DIAGNOSIS — E7841 Elevated Lipoprotein(a): Secondary | ICD-10-CM | POA: Diagnosis not present

## 2022-07-13 DIAGNOSIS — Z8261 Family history of arthritis: Secondary | ICD-10-CM | POA: Diagnosis not present

## 2022-07-13 DIAGNOSIS — Z8349 Family history of other endocrine, nutritional and metabolic diseases: Secondary | ICD-10-CM | POA: Diagnosis not present

## 2022-07-13 DIAGNOSIS — Z888 Allergy status to other drugs, medicaments and biological substances status: Secondary | ICD-10-CM | POA: Diagnosis not present

## 2022-07-13 DIAGNOSIS — Z79890 Hormone replacement therapy (postmenopausal): Secondary | ICD-10-CM | POA: Diagnosis not present

## 2022-07-13 DIAGNOSIS — Z1152 Encounter for screening for COVID-19: Secondary | ICD-10-CM | POA: Diagnosis not present

## 2022-07-13 DIAGNOSIS — Z6833 Body mass index (BMI) 33.0-33.9, adult: Secondary | ICD-10-CM

## 2022-07-13 DIAGNOSIS — F418 Other specified anxiety disorders: Secondary | ICD-10-CM | POA: Diagnosis not present

## 2022-07-13 DIAGNOSIS — I252 Old myocardial infarction: Secondary | ICD-10-CM | POA: Diagnosis not present

## 2022-07-13 DIAGNOSIS — E039 Hypothyroidism, unspecified: Secondary | ICD-10-CM | POA: Diagnosis present

## 2022-07-13 LAB — COMPREHENSIVE METABOLIC PANEL
ALT: 21 U/L (ref 0–44)
AST: 31 U/L (ref 15–41)
Albumin: 3.5 g/dL (ref 3.5–5.0)
Alkaline Phosphatase: 47 U/L (ref 38–126)
Anion gap: 19 — ABNORMAL HIGH (ref 5–15)
BUN: 13 mg/dL (ref 8–23)
CO2: 19 mmol/L — ABNORMAL LOW (ref 22–32)
Calcium: 8.6 mg/dL — ABNORMAL LOW (ref 8.9–10.3)
Chloride: 98 mmol/L (ref 98–111)
Creatinine, Ser: 0.7 mg/dL (ref 0.44–1.00)
GFR, Estimated: >60 mL/min (ref >60)
Glucose, Bld: 206 mg/dL — ABNORMAL HIGH (ref 70–99)
Potassium: 3.7 mmol/L (ref 3.5–5.1)
Sodium: 136 mmol/L (ref 135–145)
Total Bilirubin: 1.7 mg/dL — ABNORMAL HIGH (ref 0.3–1.2)
Total Protein: 6.7 g/dL (ref 6.5–8.1)

## 2022-07-13 LAB — CBC WITH DIFFERENTIAL/PLATELET
Abs Immature Granulocytes: 0.02 10*3/uL (ref 0.00–0.07)
Basophils Absolute: 0 10*3/uL (ref 0.0–0.1)
Basophils Relative: 0 %
Eosinophils Absolute: 0.1 10*3/uL (ref 0.0–0.5)
Eosinophils Relative: 2 %
HCT: 38.5 % (ref 36.0–46.0)
Hemoglobin: 12.2 g/dL (ref 12.0–15.0)
Immature Granulocytes: 0 %
Lymphocytes Relative: 54 %
Lymphs Abs: 4.3 10*3/uL — ABNORMAL HIGH (ref 0.7–4.0)
MCH: 26.5 pg (ref 26.0–34.0)
MCHC: 31.7 g/dL (ref 30.0–36.0)
MCV: 83.5 fL (ref 80.0–100.0)
Monocytes Absolute: 0.6 10*3/uL (ref 0.1–1.0)
Monocytes Relative: 8 %
Neutro Abs: 2.9 10*3/uL (ref 1.7–7.7)
Neutrophils Relative %: 36 %
Platelets: 225 10*3/uL (ref 150–400)
RBC: 4.61 MIL/uL (ref 3.87–5.11)
RDW: 13.8 % (ref 11.5–15.5)
WBC: 7.9 10*3/uL (ref 4.0–10.5)
nRBC: 0 % (ref 0.0–0.2)

## 2022-07-13 LAB — APTT: aPTT: 46 seconds — ABNORMAL HIGH (ref 24–36)

## 2022-07-13 LAB — HEMOGLOBIN A1C
Hgb A1c MFr Bld: 11.2 % — ABNORMAL HIGH (ref 4.8–5.6)
Mean Plasma Glucose: 274.74 mg/dL

## 2022-07-13 LAB — TROPONIN I (HIGH SENSITIVITY): Troponin I (High Sensitivity): 3215 ng/L (ref <18)

## 2022-07-13 LAB — MRSA NEXT GEN BY PCR, NASAL: MRSA by PCR Next Gen: NOT DETECTED

## 2022-07-13 LAB — PROTIME-INR
INR: 1.2 (ref 0.8–1.2)
Prothrombin Time: 15.1 seconds (ref 11.4–15.2)

## 2022-07-13 LAB — HEPARIN LEVEL (UNFRACTIONATED): Heparin Unfractionated: 0.24 IU/mL — ABNORMAL LOW (ref 0.30–0.70)

## 2022-07-13 LAB — TSH: TSH: 4.071 u[IU]/mL (ref 0.350–4.500)

## 2022-07-13 MED ORDER — NITROGLYCERIN 0.4 MG SL SUBL: 0.4000 mg | SUBLINGUAL_TABLET | SUBLINGUAL | Status: DC | PRN

## 2022-07-13 MED ORDER — METOPROLOL TARTRATE 12.5 MG HALF TABLET
12.5000 mg | ORAL_TABLET | Freq: Two times a day (BID) | ORAL | Status: DC
  Administered 2022-07-13 – 2022-07-20 (×15): 12.5 mg via ORAL
  Filled 2022-07-13 (×15): qty 1

## 2022-07-13 MED ORDER — CYCLOBENZAPRINE HCL 10 MG PO TABS: 5.0000 mg | ORAL_TABLET | Freq: Three times a day (TID) | ORAL | Status: DC | PRN

## 2022-07-13 MED ORDER — ASPIRIN 81 MG PO TBEC
81.0000 mg | DELAYED_RELEASE_TABLET | Freq: Every day | ORAL | Status: DC
  Administered 2022-07-14 – 2022-07-20 (×7): 81 mg via ORAL
  Filled 2022-07-13 (×7): qty 1

## 2022-07-13 MED ORDER — HYDROCODONE-ACETAMINOPHEN 5-325 MG PO TABS: 1.0000 | ORAL_TABLET | Freq: Four times a day (QID) | ORAL | Status: DC | PRN

## 2022-07-13 MED ORDER — ACETAMINOPHEN 325 MG PO TABS
650.0000 mg | ORAL_TABLET | ORAL | Status: DC | PRN
  Administered 2022-07-17: 650 mg via ORAL
  Filled 2022-07-13: qty 2

## 2022-07-13 MED ORDER — ATORVASTATIN CALCIUM 80 MG PO TABS: 80.0000 mg | ORAL_TABLET | Freq: Every day | ORAL | Status: DC

## 2022-07-13 MED ORDER — HEPARIN (PORCINE) 25000 UT/250ML-% IV SOLN
1250.0000 [IU]/h | INTRAVENOUS | Status: DC
  Administered 2022-07-14: 2000 [IU]/h via INTRAVENOUS
  Filled 2022-07-13: qty 250

## 2022-07-13 MED ORDER — TRAMADOL HCL 50 MG PO TABS: 50.0000 mg | ORAL_TABLET | Freq: Four times a day (QID) | ORAL | Status: DC | PRN

## 2022-07-13 MED ORDER — ONDANSETRON HCL 4 MG/2ML IJ SOLN: 4.0000 mg | Freq: Four times a day (QID) | INTRAMUSCULAR | Status: DC | PRN

## 2022-07-13 MED ORDER — HEPARIN BOLUS VIA INFUSION
2000.0000 [IU] | Freq: Once | INTRAVENOUS | Status: AC
  Administered 2022-07-13: 2000 [IU] via INTRAVENOUS
  Filled 2022-07-13: qty 2000

## 2022-07-13 MED ORDER — LEVOTHYROXINE SODIUM 125 MCG PO TABS
125.0000 ug | ORAL_TABLET | Freq: Every day | ORAL | Status: DC
  Administered 2022-07-14 – 2022-07-21 (×8): 125 ug via ORAL
  Filled 2022-07-13 (×8): qty 1

## 2022-07-13 NOTE — Progress Notes (Signed)
ANTICOAGULATION CONSULT NOTE  Pharmacy Consult for heparin Indication: chest pain/ACS  Allergies  Allergen Reactions   Metformin And Related Other (See Comments)    Weakness & fatigue    Patient Measurements: Height: 5\' 8"  (172.7 cm) Weight: 101.4 kg (223 lb 9.6 oz) IBW/kg (Calculated) : 63.9 Heparin Dosing Weight: 86 kg  Vital Signs: Temp: 98.3 F (36.8 C) (06/18 2146) Temp Source: Oral (06/18 2146) BP: 108/91 (06/18 2146) Pulse Rate: 95 (06/18 2146)  Labs: Recent Labs    07/13/22 2244 07/13/22 2245  HGB  --  12.2  HCT  --  38.5  PLT  --  225  APTT 46*  --   LABPROT  --  15.1  INR  --  1.2  HEPARINUNFRC 0.24*  --   CREATININE  --  0.70  TROPONINIHS  --  3,215*    Estimated Creatinine Clearance: 87.3 mL/min (by C-G formula based on SCr of 0.7 mg/dL).   Medical History: Past Medical History:  Diagnosis Date   Anemia    nos   Anxiety    Depression    Diabetes mellitus without complication (HCC)    Hypothyroidism     Assessment: 67 yo W transferred from [facility] ED for chest pain and troponin elevation. Heparin 4400 units given at noon and 960 units/hr ordered. Per RN, patient arrived with heparin running. Pharmacy consulted for heparin.    Med rec pending. Noanticoagulants in fill hx or chart review. Will check stat aPTT and heparin level.  6/18 PM update:  Heparin level sub-therapeutic   Goal of Therapy:  Heparin level 0.3-0.7 units/ml Monitor platelets by anticoagulation protocol: Yes   Plan:  Heparin 2000 units bolus Inc heparin to 1050 units/hr Re-check heparin level in 8 hours   Abran Duke, PharmD, BCPS Clinical Pharmacist Phone: [phone]

## 2022-07-13 NOTE — Progress Notes (Signed)
ANTICOAGULATION CONSULT NOTE - Initial Consult  Pharmacy Consult for heparin Indication: chest pain/ACS  Allergies  Allergen Reactions   Metformin And Related Other (See Comments)    Weakness & fatigue    Patient Measurements: Height: 5\' 8"  (172.7 cm) Weight: 101.4 kg (223 lb 9.6 oz) IBW/kg (Calculated) : 63.9 Heparin Dosing Weight: 86 kg  Vital Signs: Temp: 98.3 F (36.8 C) (06/18 2146) Temp Source: Oral (06/18 2146) BP: 108/91 (06/18 2146) Pulse Rate: 95 (06/18 2146)  Labs: No results for input(s): "HGB", "HCT", "PLT", "APTT", "LABPROT", "INR", "HEPARINUNFRC", "HEPRLOWMOCWT", "CREATININE", "CKTOTAL", "CKMB", "TROPONINIHS" in the last 72 hours.  CrCl cannot be calculated (Patient's most recent lab result is older than the maximum 21 days allowed.).   Medical History: Past Medical History:  Diagnosis Date   Anemia    nos   Anxiety    Depression    Diabetes mellitus without complication (HCC)    Hypothyroidism     Assessment: 67 yo W transferred from [facility] ED for chest pain and troponin elevation. Heparin 4400 units given at noon and 960 units/hr ordered. Per RN, patient arrived with heparin running. Pharmacy consulted for heparin.    Med rec pending. Noanticoagulants in fill hx or chart review. Will check stat aPTT and heparin level.  Goal of Therapy:  Heparin level 0.3-0.7 units/ml Monitor platelets by anticoagulation protocol: Yes   Plan:  Continue heparin 950 units/hr  F/u stat aPTT, heparin level  Monitor daily heparin level, CBC Monitor for signs/symptoms of bleeding    Alphia Moh, PharmD, BCPS, BCCP Clinical Pharmacist  Please check AMION for all [facility] Pharmacy phone numbers After 10:00 PM, call Main Pharmacy [phone]

## 2022-07-14 ENCOUNTER — Encounter (HOSPITAL_COMMUNITY)
Admission: EM | Disposition: A | Discharge status: Home / Self Care | Payer: Self-pay | Source: Other Acute Inpatient Hospital | Attending: Internal Medicine

## 2022-07-14 ENCOUNTER — Inpatient Hospital Stay (HOSPITAL_COMMUNITY): Payer: 59

## 2022-07-14 ENCOUNTER — Other Ambulatory Visit: Payer: Self-pay

## 2022-07-14 DIAGNOSIS — I214 Non-ST elevation (NSTEMI) myocardial infarction: Secondary | ICD-10-CM

## 2022-07-14 DIAGNOSIS — I2510 Atherosclerotic heart disease of native coronary artery without angina pectoris: Secondary | ICD-10-CM | POA: Diagnosis not present

## 2022-07-14 DIAGNOSIS — I509 Heart failure, unspecified: Secondary | ICD-10-CM | POA: Diagnosis not present

## 2022-07-14 DIAGNOSIS — E1110 Type 2 diabetes mellitus with ketoacidosis without coma: Secondary | ICD-10-CM

## 2022-07-14 HISTORY — PX: LEFT HEART CATH AND CORONARY ANGIOGRAPHY: CATH118249

## 2022-07-14 LAB — CBC
HCT: 40 % (ref 36.0–46.0)
Hemoglobin: 12.9 g/dL (ref 12.0–15.0)
MCH: 27.4 pg (ref 26.0–34.0)
MCHC: 32.3 g/dL (ref 30.0–36.0)
MCV: 84.9 fL (ref 80.0–100.0)
Platelets: 224 10*3/uL (ref 150–400)
RBC: 4.71 MIL/uL (ref 3.87–5.11)
RDW: 13.7 % (ref 11.5–15.5)
WBC: 8 10*3/uL (ref 4.0–10.5)
nRBC: 0 % (ref 0.0–0.2)

## 2022-07-14 LAB — BASIC METABOLIC PANEL
Anion gap: 10 (ref 5–15)
Anion gap: 12 (ref 5–15)
Anion gap: 13 (ref 5–15)
Anion gap: 15 (ref 5–15)
BUN: 13 mg/dL (ref 8–23)
BUN: 11 mg/dL (ref 8–23)
BUN: 11 mg/dL (ref 8–23)
BUN: 10 mg/dL (ref 8–23)
CO2: 22 mmol/L (ref 22–32)
CO2: 21 mmol/L — ABNORMAL LOW (ref 22–32)
CO2: 18 mmol/L — ABNORMAL LOW (ref 22–32)
CO2: 15 mmol/L — ABNORMAL LOW (ref 22–32)
Calcium: 8.1 mg/dL — ABNORMAL LOW (ref 8.9–10.3)
Calcium: 8 mg/dL — ABNORMAL LOW (ref 8.9–10.3)
Calcium: 8.2 mg/dL — ABNORMAL LOW (ref 8.9–10.3)
Calcium: 8 mg/dL — ABNORMAL LOW (ref 8.9–10.3)
Chloride: 101 mmol/L (ref 98–111)
Chloride: 100 mmol/L (ref 98–111)
Chloride: 101 mmol/L (ref 98–111)
Chloride: 99 mmol/L (ref 98–111)
Creatinine, Ser: 0.55 mg/dL (ref 0.44–1.00)
Creatinine, Ser: 0.54 mg/dL (ref 0.44–1.00)
Creatinine, Ser: 0.58 mg/dL (ref 0.44–1.00)
Creatinine, Ser: 0.87 mg/dL (ref 0.44–1.00)
GFR, Estimated: >60 mL/min (ref >60)
GFR, Estimated: >60 mL/min (ref >60)
GFR, Estimated: >60 mL/min (ref >60)
GFR, Estimated: >60 mL/min (ref >60)
Glucose, Bld: 146 mg/dL — ABNORMAL HIGH (ref 70–99)
Glucose, Bld: 173 mg/dL — ABNORMAL HIGH (ref 70–99)
Glucose, Bld: 160 mg/dL — ABNORMAL HIGH (ref 70–99)
Glucose, Bld: 242 mg/dL — ABNORMAL HIGH (ref 70–99)
Potassium: 3.7 mmol/L (ref 3.5–5.1)
Potassium: 3.9 mmol/L (ref 3.5–5.1)
Potassium: 3.9 mmol/L (ref 3.5–5.1)
Potassium: 4.5 mmol/L (ref 3.5–5.1)
Sodium: 133 mmol/L — ABNORMAL LOW (ref 135–145)
Sodium: 133 mmol/L — ABNORMAL LOW (ref 135–145)
Sodium: 132 mmol/L — ABNORMAL LOW (ref 135–145)
Sodium: 129 mmol/L — ABNORMAL LOW (ref 135–145)

## 2022-07-14 LAB — ECHOCARDIOGRAM COMPLETE
AR max vel: 2.66 cm2
AV Peak grad: 6 mmHg
Ao pk vel: 1.22 m/s
Area-P 1/2: 4.68 cm2
Height: 68 in
MV M vel: 4.29 m/s
MV Peak grad: 73.6 mmHg
S' Lateral: 4 cm
Weight: 3577.59 oz

## 2022-07-14 LAB — TROPONIN I (HIGH SENSITIVITY): Troponin I (High Sensitivity): 2338 ng/L (ref <18)

## 2022-07-14 LAB — GLUCOSE, CAPILLARY
Glucose-Capillary: 241 mg/dL — ABNORMAL HIGH (ref 70–99)
Glucose-Capillary: 184 mg/dL — ABNORMAL HIGH (ref 70–99)
Glucose-Capillary: 173 mg/dL — ABNORMAL HIGH (ref 70–99)
Glucose-Capillary: 154 mg/dL — ABNORMAL HIGH (ref 70–99)
Glucose-Capillary: 148 mg/dL — ABNORMAL HIGH (ref 70–99)
Glucose-Capillary: 150 mg/dL — ABNORMAL HIGH (ref 70–99)
Glucose-Capillary: 138 mg/dL — ABNORMAL HIGH (ref 70–99)
Glucose-Capillary: 174 mg/dL — ABNORMAL HIGH (ref 70–99)
Glucose-Capillary: 165 mg/dL — ABNORMAL HIGH (ref 70–99)
Glucose-Capillary: 206 mg/dL — ABNORMAL HIGH (ref 70–99)

## 2022-07-14 LAB — LIPID PANEL
Cholesterol: 223 mg/dL — ABNORMAL HIGH (ref 0–200)
HDL: 35 mg/dL — ABNORMAL LOW (ref >40)
LDL Cholesterol: UNDETERMINED mg/dL (ref 0–99)
Total CHOL/HDL Ratio: 6.4 RATIO
Triglycerides: 442 mg/dL — ABNORMAL HIGH (ref <150)
VLDL: UNDETERMINED mg/dL (ref 0–40)

## 2022-07-14 LAB — BLOOD GAS, VENOUS
Acid-Base Excess: 1.7 mmol/L (ref 0.0–2.0)
Bicarbonate: 27.2 mmol/L (ref 20.0–28.0)
Drawn by: 6364
O2 Saturation: 57.5 %
Patient temperature: 36.7
pCO2, Ven: 44 mmHg (ref 44–60)
pH, Ven: 7.39 (ref 7.25–7.43)
pO2, Ven: <31 mmHg — CL (ref 32–45)

## 2022-07-14 LAB — TSH: TSH: 5.031 u[IU]/mL — ABNORMAL HIGH (ref 0.350–4.500)

## 2022-07-14 LAB — LACTIC ACID, PLASMA
Lactic Acid, Venous: 1.1 mmol/L (ref 0.5–1.9)
Lactic Acid, Venous: 1.1 mmol/L (ref 0.5–1.9)

## 2022-07-14 LAB — BETA-HYDROXYBUTYRIC ACID
Beta-Hydroxybutyric Acid: 0.81 mmol/L — ABNORMAL HIGH (ref 0.05–0.27)
Beta-Hydroxybutyric Acid: 2.82 mmol/L — ABNORMAL HIGH (ref 0.05–0.27)
Beta-Hydroxybutyric Acid: 3.24 mmol/L — ABNORMAL HIGH (ref 0.05–0.27)

## 2022-07-14 LAB — BRAIN NATRIURETIC PEPTIDE: B Natriuretic Peptide: 217.7 pg/mL — ABNORMAL HIGH (ref 0.0–100.0)

## 2022-07-14 LAB — MAGNESIUM: Magnesium: 2.1 mg/dL (ref 1.7–2.4)

## 2022-07-14 LAB — LDL CHOLESTEROL, DIRECT: Direct LDL: 124 mg/dL — ABNORMAL HIGH (ref 0–99)

## 2022-07-14 LAB — HEPARIN LEVEL (UNFRACTIONATED): Heparin Unfractionated: 0.16 IU/mL — ABNORMAL LOW (ref 0.30–0.70)

## 2022-07-14 LAB — PHOSPHORUS: Phosphorus: 2.9 mg/dL (ref 2.5–4.6)

## 2022-07-14 LAB — HIV ANTIBODY (ROUTINE TESTING W REFLEX): HIV Screen 4th Generation wRfx: NONREACTIVE

## 2022-07-14 SURGERY — LEFT HEART CATH AND CORONARY ANGIOGRAPHY
Anesthesia: LOCAL

## 2022-07-14 MED ORDER — HEPARIN SODIUM (PORCINE) 1000 UNIT/ML IJ SOLN
INTRAMUSCULAR | Status: DC | PRN
  Administered 2022-07-14: 5000 [IU] via INTRAVENOUS

## 2022-07-14 MED ORDER — ASPIRIN 81 MG PO CHEW: 81.0000 mg | CHEWABLE_TABLET | ORAL | Status: DC

## 2022-07-14 MED ORDER — FUROSEMIDE 10 MG/ML IJ SOLN
40.0000 mg | Freq: Two times a day (BID) | INTRAMUSCULAR | Status: AC
  Administered 2022-07-14 – 2022-07-16 (×4): 40 mg via INTRAVENOUS
  Filled 2022-07-14 (×4): qty 4

## 2022-07-14 MED ORDER — EZETIMIBE 10 MG PO TABS
10.0000 mg | ORAL_TABLET | Freq: Every day | ORAL | Status: DC
  Administered 2022-07-14 – 2022-07-20 (×7): 10 mg via ORAL
  Filled 2022-07-14 (×7): qty 1

## 2022-07-14 MED ORDER — FENTANYL CITRATE (PF) 100 MCG/2ML IJ SOLN
INTRAMUSCULAR | Status: AC
  Filled 2022-07-14: qty 2

## 2022-07-14 MED ORDER — SODIUM CHLORIDE 0.9% FLUSH: 3.0000 mL | INTRAVENOUS | Status: DC | PRN

## 2022-07-14 MED ORDER — HEPARIN SODIUM (PORCINE) 1000 UNIT/ML IJ SOLN
INTRAMUSCULAR | Status: AC
  Filled 2022-07-14: qty 10

## 2022-07-14 MED ORDER — LIDOCAINE HCL (PF) 1 % IJ SOLN
INTRAMUSCULAR | Status: DC | PRN
  Administered 2022-07-14: 2 mL

## 2022-07-14 MED ORDER — FENTANYL CITRATE (PF) 100 MCG/2ML IJ SOLN
INTRAMUSCULAR | Status: DC | PRN
  Administered 2022-07-14: 25 ug via INTRAVENOUS

## 2022-07-14 MED ORDER — LABETALOL HCL 5 MG/ML IV SOLN: 10.0000 mg | INTRAVENOUS | Status: AC | PRN

## 2022-07-14 MED ORDER — ACETAMINOPHEN 325 MG PO TABS: 650.0000 mg | ORAL_TABLET | ORAL | Status: DC | PRN

## 2022-07-14 MED ORDER — SODIUM CHLORIDE 0.9 % IV SOLN: INTRAVENOUS | Status: DC

## 2022-07-14 MED ORDER — ONDANSETRON HCL 4 MG/2ML IJ SOLN: 4.0000 mg | Freq: Four times a day (QID) | INTRAMUSCULAR | Status: DC | PRN

## 2022-07-14 MED ORDER — HEPARIN BOLUS VIA INFUSION
2000.0000 [IU] | Freq: Once | INTRAVENOUS | Status: DC
  Filled 2022-07-14: qty 2000

## 2022-07-14 MED ORDER — VERAPAMIL HCL 2.5 MG/ML IV SOLN
INTRAVENOUS | Status: AC
  Filled 2022-07-14: qty 2

## 2022-07-14 MED ORDER — ATORVASTATIN CALCIUM 80 MG PO TABS
80.0000 mg | ORAL_TABLET | Freq: Every day | ORAL | Status: DC
  Administered 2022-07-14 – 2022-07-26 (×8): 80 mg via ORAL
  Filled 2022-07-14 (×13): qty 1

## 2022-07-14 MED ORDER — MIDAZOLAM HCL 2 MG/2ML IJ SOLN
INTRAMUSCULAR | Status: DC | PRN
  Administered 2022-07-14: 1 mg via INTRAVENOUS

## 2022-07-14 MED ORDER — VERAPAMIL HCL 2.5 MG/ML IV SOLN
INTRAVENOUS | Status: DC | PRN
  Administered 2022-07-14 (×2): 10 mL via INTRA_ARTERIAL

## 2022-07-14 MED ORDER — INSULIN GLARGINE-YFGN 100 UNIT/ML ~~LOC~~ SOLN
10.0000 [IU] | Freq: Every day | SUBCUTANEOUS | Status: DC
  Administered 2022-07-14: 10 [IU] via SUBCUTANEOUS
  Filled 2022-07-14 (×2): qty 0.1

## 2022-07-14 MED ORDER — INSULIN REGULAR(HUMAN) IN NACL 100-0.9 UT/100ML-% IV SOLN
INTRAVENOUS | Status: DC
  Administered 2022-07-14: 12 [IU]/h via INTRAVENOUS
  Filled 2022-07-14: qty 100

## 2022-07-14 MED ORDER — HYDRALAZINE HCL 20 MG/ML IJ SOLN: 10.0000 mg | INTRAMUSCULAR | Status: AC | PRN

## 2022-07-14 MED ORDER — HEPARIN (PORCINE) 25000 UT/250ML-% IV SOLN
1700.0000 [IU]/h | INTRAVENOUS | Status: DC
  Administered 2022-07-14: 1300 [IU]/h via INTRAVENOUS
  Administered 2022-07-15: 1700 [IU]/h via INTRAVENOUS
  Administered 2022-07-15: 1500 [IU]/h via INTRAVENOUS
  Administered 2022-07-16 – 2022-07-20 (×7): 1700 [IU]/h via INTRAVENOUS
  Filled 2022-07-14 (×10): qty 250

## 2022-07-14 MED ORDER — POTASSIUM CHLORIDE 10 MEQ/100ML IV SOLN
10.0000 meq | INTRAVENOUS | Status: DC
Start: 2022-07-14 — End: 2022-07-14

## 2022-07-14 MED ORDER — SODIUM CHLORIDE 0.9% FLUSH
3.0000 mL | Freq: Two times a day (BID) | INTRAVENOUS | Status: DC
  Administered 2022-07-14 – 2022-07-20 (×12): 3 mL via INTRAVENOUS

## 2022-07-14 MED ORDER — MIDAZOLAM HCL 2 MG/2ML IJ SOLN
INTRAMUSCULAR | Status: AC
  Filled 2022-07-14: qty 2

## 2022-07-14 MED ORDER — LIDOCAINE HCL (PF) 1 % IJ SOLN
INTRAMUSCULAR | Status: AC
  Filled 2022-07-14: qty 30

## 2022-07-14 MED ORDER — DEXTROSE IN LACTATED RINGERS 5 % IV SOLN: INTRAVENOUS | Status: DC

## 2022-07-14 MED ORDER — POTASSIUM CHLORIDE 10 MEQ/100ML IV SOLN
10.0000 meq | INTRAVENOUS | Status: AC
  Administered 2022-07-14 (×4): 10 meq via INTRAVENOUS
  Filled 2022-07-14 (×4): qty 100

## 2022-07-14 MED ORDER — SODIUM CHLORIDE 0.9 % IV SOLN: 250.0000 mL | INTRAVENOUS | Status: DC | PRN

## 2022-07-14 MED ORDER — SODIUM CHLORIDE 0.9% FLUSH: 3.0000 mL | Freq: Two times a day (BID) | INTRAVENOUS | Status: DC

## 2022-07-14 MED ORDER — INSULIN ASPART 100 UNIT/ML IJ SOLN
0.0000 [IU] | Freq: Three times a day (TID) | INTRAMUSCULAR | Status: DC
  Administered 2022-07-14 (×2): 2 [IU] via SUBCUTANEOUS
  Administered 2022-07-15: 3 [IU] via SUBCUTANEOUS
  Administered 2022-07-15: 2 [IU] via SUBCUTANEOUS
  Administered 2022-07-15 – 2022-07-16 (×3): 5 [IU] via SUBCUTANEOUS
  Administered 2022-07-16 – 2022-07-17 (×2): 7 [IU] via SUBCUTANEOUS

## 2022-07-14 MED ORDER — HEPARIN (PORCINE) IN NACL 1000-0.9 UT/500ML-% IV SOLN
INTRAVENOUS | Status: DC | PRN
  Administered 2022-07-14 (×2): 500 mL

## 2022-07-14 MED ORDER — IOHEXOL 350 MG/ML SOLN
INTRAVENOUS | Status: DC | PRN
  Administered 2022-07-14: 65 mL via INTRA_ARTERIAL

## 2022-07-14 MED ORDER — DEXTROSE 50 % IV SOLN: 0.0000 mL | INTRAVENOUS | Status: DC | PRN

## 2022-07-14 SURGICAL SUPPLY — 10 items
CATH 5FR JL3.5 JR4 ANG PIG MP (CATHETERS) IMPLANT
DEVICE RAD COMP TR BAND LRG (VASCULAR PRODUCTS) IMPLANT
GLIDESHEATH SLEND SS 6F .021 (SHEATH) IMPLANT
GUIDEWIRE INQWIRE 1.5J.035X260 (WIRE) IMPLANT
INQWIRE 1.5J .035X260CM (WIRE) ×1
KIT HEART LEFT (KITS) ×1 IMPLANT
PACK CARDIAC CATHETERIZATION (CUSTOM PROCEDURE TRAY) ×1 IMPLANT
SHEATH PROBE COVER 6X72 (BAG) IMPLANT
TRANSDUCER W/STOPCOCK (MISCELLANEOUS) ×1 IMPLANT
TUBING CIL FLEX 10 FLL-RA (TUBING) ×1 IMPLANT

## 2022-07-14 NOTE — H&P (View-Only) (Signed)
Labs reviewed PH 7.39. Gap closed. Cr 0.55. Bicarb 22. Na 133. Mag 2.1. K 3.7. HDL 35. LDL unable calculate. Triglycerides 442. Glucose 138-184 since AM.   Will start Novolog AC, semglee 10u at bedtime. Stop Insulin gtt and D5. Start Zetia 10mg.   LHC planned today.  

## 2022-07-14 NOTE — Interval H&P Note (Signed)
Cath Lab Visit (complete for each Cath Lab visit)  Clinical Evaluation Leading to the Procedure:   ACS: Yes.    Non-ACS:    Anginal Classification: CCS IV  Anti-ischemic medical therapy: Minimal Therapy (1 class of medications)  Non-Invasive Test Results: High-risk stress test findings: cardiac mortality >3%/year  Prior CABG: No previous CABG   Abnormal echo   History and Physical Interval Note:  07/14/2022 3:07 PM  Alisha Williams  has presented today for surgery, with the diagnosis of chest pain.  The various methods of treatment have been discussed with the patient and family. After consideration of risks, benefits and other options for treatment, the patient has consented to  Procedure(s): LEFT HEART CATH AND CORONARY ANGIOGRAPHY (N/A) as a surgical intervention.  The patient's history has been reviewed, patient examined, no change in status, stable for surgery.  I have reviewed the patient's chart and labs.  Questions were answered to the patient's satisfaction.     Lance Muss

## 2022-07-14 NOTE — H&P (Addendum)
Cardiology Admission History and Physical   Patient ID: Alisha Williams MRN: [id number]; DOB: August 12, 1955   Admission date: 07/13/2022  PCP:  Juliette Alcide, MD   [facility] Providers Cardiologist:  None        Chief Complaint: Chest pain  Patient Profile:   Alisha Williams is a 67 y.o. female with type 2 diabetes, hypothyroidism who is being seen 07/14/2022 for the evaluation of chest pain.  History of Present Illness:   Ms. Harrigan reports of 1 week history of chest pain with associated throat discomfort, headache, nausea, and dyspnea on exertion.  She initially assumed her symptoms were consistent with indigestion but did not resolve with typical home remedies.  Chest pain initially started as mild and intermittent but progressed to chest pain with any exertion.  She also reports progressive orthopnea, PND, bloating, and early satiety.  She presented to her PCP appointment this morning and reported chest pain which prompted an ECG which was concerning for ACS.  She was subsequently transferred to a local hospital where she was diagnosed with NSTEMI.  She was then transferred to our hospital for further evaluation and treatment.  She also reports a progressive enlargement of a reported cyst in the middle of her upper back that is uncomfortable if she lays flat.  She denies tobacco use, regular alcohol use, or recreational drugs.  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   Past Medical History:  Diagnosis Date   Anemia    nos   Anxiety    Depression    Diabetes mellitus without complication (HCC)    Hypothyroidism     Past Surgical History:  Procedure Laterality Date   CESAREAN SECTION     strectch kidney tube  1970   TONSILLECTOMY     TUBAL LIGATION       Medications Prior to Admission: Prior to Admission medications   Medication Sig Start Date End Date Taking? Authorizing Provider  CINNAMON PO Take by mouth.    [provider]   cyclobenzaprine (FLEXERIL) 5 MG tablet Take 1-2 tablets (5-10 mg total) by mouth 3 (three) times daily as needed for muscle spasms. 11/01/14   Panosh, Neta Mends, MD  ferrous sulfate 325 (65 FE) MG tablet Take 325 mg by mouth daily with breakfast.      [provider]  glucose blood (ONETOUCH VERIO) test strip 1 each by Other route as needed for other. Use as instructed    [provider]  HYDROcodone-acetaminophen (NORCO/VICODIN) 5-325 MG tablet Take 1 tablet by mouth every 6 (six) hours as needed for severe pain. 11/01/14   Panosh, Neta Mends, MD  Iodine, Kelp, (KELP PO) Take by mouth.    [provider]  levothyroxine (SYNTHROID, LEVOTHROID) 75 MCG tablet Take 1 tablet (75 mcg total) by mouth daily. 02/06/15   Panosh, Neta Mends, MD  liraglutide (VICTOZA) 18 MG/3ML SOPN Inject 0.2 mLs (1.2 mg total) into the skin daily. 10/01/19   Rolan Bucco, MD  metFORMIN (GLUCOPHAGE-XR) 500 MG 24 hr tablet TAKE 1 TABLET (500 MG TOTAL) BY MOUTH DAILY WITH BREAKFAST. INCREASE TO  2 PER DAY IN 2 WEEKS AND THEN 3 PER DAY IN  4 WEEKS OR AS DIRECTED 08/02/14   Panosh, Neta Mends, MD  MULTIPLE VITAMIN PO Take 1 tablet by mouth daily.     [provider]  [facility] DELICA LANCETS FINE MISC by Does not apply route. Use as instructed    [provider]  predniSONE (DELTASONE) 10 MG tablet Take 1 tablet (10 mg total) by mouth daily. Take 3 tablets a day for 5 days - then Take 2 tablets a day for 5 days - then Take 1 tablet a day for 5 days 01/14/22   Mesner, Barbara Cower, MD  traMADol (ULTRAM) 50 MG tablet Take 1 tablet (50 mg total) by mouth every 6 (six) hours as needed. 10/01/19   Rolan Bucco, MD     Allergies:    Allergies  Allergen Reactions   Metformin And Related Other (See Comments)    Weakness & fatigue    Social History:   Social History   Socioeconomic History   Marital status: Married    Spouse name: Not on file   Number of children: Not on file   Years of education: Not  on file   Highest education level: Not on file  Occupational History   Not on file  Tobacco Use   Smoking status: Never   Smokeless tobacco: Not on file  Substance and Sexual Activity   Alcohol use: No   Drug use: Not on file   Sexual activity: Not on file  Other Topics Concern   Not on file  Social History Narrative   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   [employer]   Married children            Social Determinants of Health   Financial Resource Strain: Not on file  Food Insecurity: No Food Insecurity (07/13/2022)   Hunger Vital Sign    Worried About Running Out of Food in the Last Year: Never true    Ran Out of Food in the Last Year: Never true  Transportation Needs: No Transportation Needs (07/13/2022)   PRAPARE - Administrator, Civil Service (Medical): No    Lack of Transportation (Non-Medical): No  Physical Activity: Not on file  Stress: Not on file  Social Connections: Not on file  Intimate Partner Violence: Not At Risk (07/13/2022)   Humiliation, Afraid, Rape, and Kick questionnaire    Fear of Current or Ex-Partner: No    Emotionally Abused: No    Physically Abused: No    Sexually Abused: No    Family History:   The patient's family history includes Arthritis in her mother; Breast cancer in her mother and sister; Fibromyalgia in her mother; Heart attack in her father; Hypertension in her mother; Thyroid disease in her mother and sister.    ROS:  Please see the history of present illness.  All other ROS reviewed and negative.     Physical Exam/Data:   Vitals:   07/13/22 2146  BP: (!) 108/91  Pulse: 95  Resp: 15  Temp: 98.3 F (36.8 C)  TempSrc: Oral  Weight: 101.4 kg  Height: 5\' 8"  (1.727 m)   No intake or output data in the 24 hours ending 07/14/22 0150    07/13/2022    9:46 PM 11/01/2014    9:48 AM 08/02/2014    9:12 AM  Last 3 Weights  Weight (lbs) 223 lb 9.6 oz 263 lb 1.6 oz 257 lb 9.6 oz  Weight (kg) 101.424 kg 119.341 kg  116.847 kg     Body mass index is 34 kg/m.  General:  Well nourished, well developed, in no acute distress HEENT: normal Neck: elevated JVD Vascular: No carotid bruits; Distal pulses 2+ bilaterally   Cardiac:  normal S1, S2; RRR; no murmur Lungs:  crackles bilaterally, no wheezing, rhonchi or rales  Abd:  soft, nontender, no hepatomegaly  Ext: no edema Musculoskeletal:  No deformities, BUE and BLE strength normal and equal, in the center of the back there is a large subcutaneous deformity that is non-tender with no associated erythema or warmth, fluctuant Skin: warm and dry  Neuro:  CNs 2-12 intact, no focal abnormalities noted Psych:  Normal affect    EKG:  The ECG that was done and was personally reviewed and demonstrates normal sinus rhythm with ST depression laterally and inferior ST changes not meeting STEMI criteria  Relevant CV Studies: none  Laboratory Data:  High Sensitivity Troponin:   Recent Labs  Lab 07/13/22 2245 07/14/22 0000  TROPONINIHS 3,215* 2,338*      Chemistry Recent Labs  Lab 07/13/22 2245  NA 136  K 3.7  CL 98  CO2 19*  GLUCOSE 206*  BUN 13  CREATININE 0.70  CALCIUM 8.6*  GFRNONAA >60  ANIONGAP 19*    Recent Labs  Lab 07/13/22 2245  PROT 6.7  ALBUMIN 3.5  AST 31  ALT 21  ALKPHOS 47  BILITOT 1.7*   Lipids No results for input(s): "CHOL", "TRIG", "HDL", "LABVLDL", "LDLCALC", "CHOLHDL" in the last 168 hours. Hematology Recent Labs  Lab 07/13/22 2245 07/13/22 2355  WBC 7.9 8.0  RBC 4.61 4.71  HGB 12.2 12.9  HCT 38.5 40.0  MCV 83.5 84.9  MCH 26.5 27.4  MCHC 31.7 32.3  RDW 13.8 13.7  PLT 225 224   Thyroid  Recent Labs  Lab 07/13/22 2245  TSH 4.071   BNPNo results for input(s): "BNP", "PROBNP" in the last 168 hours.  DDimer No results for input(s): "DDIMER" in the last 168 hours.   Radiology/Studies:  No results found.   Assessment and Plan:   NSTEMI - chest pain with dynamic troponin and ST changes - ASA  daily - Heparin gtt - Metoprolol 12.5 mg BID - Echo - Cath in AM  Acute heart failure (unknown EF) - orthopnea, PND, elevated BNP, NYHA III - Furosemide IV once - Continue Farxiga - Additional therapies pending Echo  Euglycemia DKA - Glucose <250, anion gap elevated, BHB elevated, patient on home SGLT2i, likely triggered by MI - transfer to ICU for insulin gtt - consult intensivist in AM  T2DM - A1c 11.2, hold farxiga, needs insulin initiation Constipation - suspect this sensation is related to gut edema, monitor   Risk Assessment/Risk Scores:    TIMI Risk Score for Unstable Angina or Non-ST Elevation MI:   The patient's TIMI risk score is  , which indicates a  % risk of all cause mortality, new or recurrent myocardial infarction or need for urgent revascularization in the next 14 days.  New York Heart Association (NYHA) Functional Class NYHA Class III     Severity of Illness: The appropriate patient status for this patient is INPATIENT. Inpatient status is judged to be reasonable and necessary in order to provide the required intensity of service to ensure the patient's safety. The patient's presenting symptoms, physical exam findings, and initial radiographic and laboratory data in the context of their chronic comorbidities is felt to place them at high risk for further clinical deterioration. Furthermore, it is not anticipated that the patient will be medically stable for discharge from the hospital within 2 midnights of admission.   * I certify that at the point of admission it is my clinical judgment that the patient will require inpatient hospital care spanning beyond 2 midnights from the point of admission due to high intensity of  service, high risk for further deterioration and high frequency of surveillance required.*   For questions or updates, please contact [facility] Please consult [email or web address] for contact info under     Signed, Roderic Palau, MD   07/14/2022 1:50 AM

## 2022-07-14 NOTE — Progress Notes (Addendum)
Rounding Note    Patient Name: Alisha Williams Date of Encounter: 07/14/2022  [facility] Cardiologist: New to Dr Jens Som   Subjective   Patient states she has been having ongoing mid-sternum chest pain radiating to her anterior neck and jaw for a week, triggered by exertional activity. She confirmed that she has been DOE, orthopnea, PND, nausea, feeling poor. She denied any significant abdominal bloating or leg edema. She ran out of her Victoza since 12/2021 due to insurance loss, has been taken farxiga only for her DM. She denied hx of MI, CAD, CVA, CKD. She was not aware of any cardiac disease in the past. She is chest pain free at this time. She states her oxygen level dropped overnight and is now on Menan oxygen, does not have hx of OSA.   Inpatient Medications    Scheduled Meds:  aspirin EC  81 mg Oral Daily   atorvastatin  80 mg Oral Daily   levothyroxine  75 mcg Oral Daily   metoprolol tartrate  12.5 mg Oral BID   Continuous Infusions:  dextrose 5% lactated ringers 125 mL/hr at 07/14/22 0409   heparin 1,050 Units/hr (07/14/22 0756)   insulin 4 Units/hr (07/14/22 0719)   PRN Meds: acetaminophen, dextrose, nitroGLYCERIN, ondansetron (ZOFRAN) IV   Vital Signs    Vitals:   07/13/22 2300 07/14/22 0315 07/14/22 0500 07/14/22 0819  BP: 135/80 (!) 100/59  125/81  Pulse: 92 87  88  Resp: (!) 30 (!) 24  16  Temp: 97.9 F (36.6 C)   98.1 F (36.7 C)  TempSrc: Oral   Oral  SpO2: 93% 92%  93%  Weight:   101.4 kg   Height:        Intake/Output Summary (Last 24 hours) at 07/14/2022 0834 Last data filed at 07/14/2022 0756 Gross per 24 hour  Intake 85.92 ml  Output --  Net 85.92 ml      07/14/2022    5:00 AM 07/13/2022    9:46 PM 11/01/2014    9:48 AM  Last 3 Weights  Weight (lbs) 223 lb 9.6 oz 223 lb 9.6 oz 263 lb 1.6 oz  Weight (kg) 101.424 kg 101.424 kg 119.341 kg      Telemetry    Sinus rhythm, occasional PVC - Personally Reviewed  ECG    EKG showed  sinus rhythm 85bpm, lateral lead STD, inferior lead ST non-specific abnormalities   - Personally Reviewed  Physical Exam   GEN: No acute distress.   Neck: JVD up to jaw with HOB elevated at 30 degree  Cardiac: RRR, grade II systolic murmur  Respiratory: Clear to auscultation bilaterally. On  oxygen. Pox 96%  GI: Soft, nontender, non-distended  MS: No leg edema; No deformity. Neuro:  Nonfocal  Psych: Normal affect   Labs    High Sensitivity Troponin:   Recent Labs  Lab 07/13/22 2245 07/14/22 0000  TROPONINIHS 3,215* 2,338*     Chemistry Recent Labs  Lab 07/13/22 2245  NA 136  K 3.7  CL 98  CO2 19*  GLUCOSE 206*  BUN 13  CREATININE 0.70  CALCIUM 8.6*  PROT 6.7  ALBUMIN 3.5  AST 31  ALT 21  ALKPHOS 47  BILITOT 1.7*  GFRNONAA >60  ANIONGAP 19*    Lipids No results for input(s): "CHOL", "TRIG", "HDL", "LABVLDL", "LDLCALC", "CHOLHDL" in the last 168 hours.  Hematology Recent Labs  Lab 07/13/22 2245 07/13/22 2355  WBC 7.9 8.0  RBC 4.61 4.71  HGB 12.2 12.9  HCT 38.5 40.0  MCV 83.5 84.9  MCH 26.5 27.4  MCHC 31.7 32.3  RDW 13.8 13.7  PLT 225 224   Thyroid  Recent Labs  Lab 07/13/22 2245  TSH 4.071    BNPNo results for input(s): "BNP", "PROBNP" in the last 168 hours.  DDimer No results for input(s): "DDIMER" in the last 168 hours.   Radiology    No results found.  Cardiac Studies   Echo is pending today   Patient Profile     67 y.o. female with PMH of hypothyroidism, type 2 diabetes, anxiety, who was transferred [facility] to [facility] for further evaluation of non-STEMI. Patient is also found in DKA upon arrival.   Assessment & Plan    Non-STEMI -Patient presented to [facility] for 1 week onset of chest pain with exertion, headache, nausea, DOE, orthopnea, PND - Hs trop 3215 [id number] - EKG with lateral ST depression and inferior subtle STD concerning for ischemia  - pending heart cath planned, may need to hold until DKA  resolve - A1C 11.2%, TSH WNL, will check lipid panel and Echo today  - Medical therapy: continue heparin gtt and PRN Nitro, will start ASA 81mg  daily, metoprolol 12.5mg  BID, and lipitor 80mg    Acute heart failure with unknown EF - presented with chest pain with exertion, DOE, bloating, orthopnea, PND - check BNP and Labs today - check Echo today  - clinically with nocturnal hypoxia, elevated JVD, no peripheral edema, suspect volume up, on D5 at 150ml./hr as part of insulin gtt protocol, plan to turn off IVF if gap closes, hold off diuresis  - GDMT: start metoprolol 12.5mg  BID, more rec pending work up   DKA Type 2 DM, uncontrolled  - stopped  - arrival labs from 07/13/22 showed bicarb 19, anion gap 19, beta-hydro 3.24, A1C 11.2% - started on insulin gtt with D5 overnight - will repeat STAT BMP, VBG, Mag, Phos now  - critical care team called this morning for official consult   Hypothyroidism - TSH WNL - Will resume PTA levothyroxine   For questions or updates, please contact [facility] Please consult [email or web address] for contact info under   Signed, Cyndi Bender, NP  07/14/2022, 8:34 AM   As above, patient seen and examined.  Patient is pain-free.  She presented with classic unstable angina/non-ST elevation microinfarction symptoms.  Her enzymes were abnormal.  Plan cardiac catheterization today.  The risk and benefits including myocardial infarction, CVA and death discussed and she agrees to proceed.  Continue aspirin, heparin, statin and beta-blocker.  Check echocardiogram for LV function.  Note she does not appear to be in heart failure on examination.  She complained of dyspnea predominately at times of her chest pain.  Critical care has been consulted for management of diabetes mellitus.  Note her pH at present is 7.39 with bicarb of 27.2. Olga Millers, MD

## 2022-07-14 NOTE — Consult Note (Signed)
NAME:  Alisha Williams, MRN:  [id number], DOB:  01-29-1955, LOS: 1 ADMISSION DATE:  07/13/2022, CONSULTATION DATE: 07/14/2022 REFERRING MD: Radiology, CHIEF COMPLAINT: Hyperglycemia  History of Present Illness:  67 year old female who presented with 1 week of chest pain associated throat discomfort headache nausea and dyspnea on exertion.  She had increasing chest pain and increasing shortness of breath and presented for further evaluation and treatment from her primary care physician.  She has known diabetes mellitus and has been unable to obtain her Victoza due to insurance issues.  She notes her sugars run between 2 and 300 on a normal basis but she has not refused to take insulin at this time.  Her venous pH is noted to be normal her glucose is quickly dropped to a low normal level with an insulin drip.  She can be transitioned over to sliding scale insulin before meals and at bedtime per protocol and she needs diabetic coordinator to come see her.  She was to be scheduled with an endocrinologist as an outpatient but the addition of insulin should most likely take care of the issues.  Pulmonary critical care will be available as needed.  Pertinent  Medical History   Past Medical History:  Diagnosis Date   Anemia    nos   Anxiety    Depression    Diabetes mellitus without complication (HCC)    Hypothyroidism      Significant Hospital Events: Including procedures, antibiotic start and stop dates in addition to other pertinent events     Interim History / Subjective:  Awake and alert no acute distress  Objective   Blood pressure 126/78, pulse 90, temperature 98.1 F (36.7 C), temperature source Oral, resp. rate 17, height 5\' 8"  (1.727 m), weight 101.4 kg, SpO2 93 %.        Intake/Output Summary (Last 24 hours) at 07/14/2022 1029 Last data filed at 07/14/2022 0756 Gross per 24 hour  Intake 85.92 ml  Output --  Net 85.92 ml   Filed Weights   07/13/22 2146 07/14/22 0500  Weight:  101.4 kg 101.4 kg    Examination: General: Well-nourished well-developed female no acute distress HENT: No JVD or lymphadenopathy is appreciated Lungs: Clear to auscultation Cardiovascular: Heart sounds are regular Abdomen: Soft positive bowel sounds obese Extremities: Warm dry without edema Neuro: Grossly intact without focal defect GU: Voids  Resolved Hospital Problem list     Assessment & Plan:  Hyperglycemia noting of poorly controlled diabetes mellitus his refusal to utilize insulin she normally has a glucose running anywhere from 200-3 20 and has a hemoglobin A 1C of 11.2.  She presented with acute coronary syndrome currently is on a heparin drip.  She is also on insulin drip which can be transition to ACHS sliding scale insulin protocol. Transition to ACHS sliding scale insulin protocol Diabetic coordinator to see patient She will most likely need long-acting insulin twice a day starting at 10 units twice daily She has been counseled the need to utilize insulin and diet Pulmonary critical care will be available as needed  Chest pain suspect acute coronary syndrome Per cardiology  Hypothyroidism Continue Synthroid Check TSH  Anxiety and depression Anxiolytics  Best Practice (right click and "Reselect all SmartList Selections" daily)   Diet/type: Regular consistency (see orders) DVT prophylaxis: systemic heparin GI prophylaxis: PPI Lines: N/A Foley:  N/A Code Status:  full code Last date of multidisciplinary goals of care discussion [tbd]  Labs   CBC: Recent Labs  Lab 07/13/22 2245  07/13/22 2355  WBC 7.9 8.0  NEUTROABS 2.9  --   HGB 12.2 12.9  HCT 38.5 40.0  MCV 83.5 84.9  PLT 225 224    Basic Metabolic Panel: Recent Labs  Lab 07/13/22 2245 07/14/22 0749  NA 136 133*  K 3.7 3.7  CL 98 101  CO2 19* 22  GLUCOSE 206* 146*  BUN 13 13  CREATININE 0.70 0.55  CALCIUM 8.6* 8.1*  MG  --  2.1   GFR: Estimated Creatinine Clearance: 87.3 mL/min (by  C-G formula based on SCr of 0.55 mg/dL). Recent Labs  Lab 07/13/22 2245 07/13/22 2355  WBC 7.9 8.0    Liver Function Tests: Recent Labs  Lab 07/13/22 2245  AST 31  ALT 21  ALKPHOS 47  BILITOT 1.7*  PROT 6.7  ALBUMIN 3.5   No results for input(s): "LIPASE", "AMYLASE" in the last 168 hours. No results for input(s): "AMMONIA" in the last 168 hours.  ABG    Component Value Date/Time   HCO3 27.2 07/14/2022 0749   TCO2 26 01/13/2022 1613   O2SAT 57.5 07/14/2022 0749     Coagulation Profile: Recent Labs  Lab 07/13/22 2245  INR 1.2    Cardiac Enzymes: No results for input(s): "CKTOTAL", "CKMB", "CKMBINDEX", "TROPONINI" in the last 168 hours.  HbA1C: Hgb A1c MFr Bld  Date/Time Value Ref Range Status  07/13/2022 10:45 PM 11.2 (H) 4.8 - 5.6 % Final    Comment:    (NOTE) Pre diabetes:          5.7%-6.4%  Diabetes:              >6.4%  Glycemic control for   <7.0% adults with diabetes   11/18/2014 03:19 PM 7.4 (H) 4.6 - 6.5 % Final    Comment:    Glycemic Control Guidelines for People with Diabetes:Non Diabetic:  <6%Goal of Therapy: <7%Additional Action Suggested:  >8%     CBG: Recent Labs  Lab 07/14/22 0511 07/14/22 0615 07/14/22 0718 07/14/22 0834 07/14/22 0923  GLUCAP 184* 173* 154* 148* 150*    Review of Systems:   10 point review of system taken, please see HPI for positives and negatives.   Past Medical History:  She,  has a past medical history of Anemia, Anxiety, Depression, Diabetes mellitus without complication (HCC), and Hypothyroidism.   Surgical History:   Past Surgical History:  Procedure Laterality Date   CESAREAN SECTION     strectch kidney tube  1970   TONSILLECTOMY     TUBAL LIGATION       Social History:   reports that she has never smoked. She does not have any smokeless tobacco history on file. She reports that she does not drink alcohol.   Family History:  Her family history includes Arthritis in her mother; Breast  cancer in her mother and sister; Fibromyalgia in her mother; Heart attack in her father; Hypertension in her mother; Thyroid disease in her mother and sister.   Allergies Allergies  Allergen Reactions   Metformin And Related Other (See Comments)    Weakness & fatigue     Home Medications  Prior to Admission medications   Medication Sig Start Date End Date Taking? Authorizing Provider  CINNAMON PO Take by mouth.    [provider]  cyclobenzaprine (FLEXERIL) 5 MG tablet Take 1-2 tablets (5-10 mg total) by mouth 3 (three) times daily as needed for muscle spasms. 11/01/14   Panosh, Neta Mends, MD  FARXIGA 10 MG TABS tablet Take  10 mg by mouth daily. 07/06/22   [provider]  ferrous sulfate 325 (65 FE) MG tablet Take 325 mg by mouth daily with breakfast.      [provider]  glucose blood (ONETOUCH VERIO) test strip 1 each by Other route as needed for other. Use as instructed    [provider]  HYDROcodone-acetaminophen (NORCO/VICODIN) 5-325 MG tablet Take 1 tablet by mouth every 6 (six) hours as needed for severe pain. 11/01/14   Panosh, Neta Mends, MD  Iodine, Kelp, (KELP PO) Take by mouth.    [provider]  levothyroxine (SYNTHROID) 125 MCG tablet Take 125 mcg by mouth daily before breakfast. 07/06/22   [provider]  levothyroxine (SYNTHROID, LEVOTHROID) 75 MCG tablet Take 1 tablet (75 mcg total) by mouth daily. 02/06/15   Panosh, Neta Mends, MD  liraglutide (VICTOZA) 18 MG/3ML SOPN Inject 0.2 mLs (1.2 mg total) into the skin daily. 10/01/19   Rolan Bucco, MD  metFORMIN (GLUCOPHAGE-XR) 500 MG 24 hr tablet TAKE 1 TABLET (500 MG TOTAL) BY MOUTH DAILY WITH BREAKFAST. INCREASE TO  2 PER DAY IN 2 WEEKS AND THEN 3 PER DAY IN  4 WEEKS OR AS DIRECTED Patient taking differently: Take 1,500 mg by mouth daily with breakfast. 08/02/14   Panosh, Neta Mends, MD  MULTIPLE VITAMIN PO Take 1 tablet by mouth daily.     [provider]  [facility] DELICA  LANCETS FINE MISC by Does not apply route. Use as instructed    [provider]  predniSONE (DELTASONE) 10 MG tablet Take 1 tablet (10 mg total) by mouth daily. Take 3 tablets a day for 5 days - then Take 2 tablets a day for 5 days - then Take 1 tablet a day for 5 days 01/14/22   Mesner, Barbara Cower, MD  traMADol (ULTRAM) 50 MG tablet Take 1 tablet (50 mg total) by mouth every 6 (six) hours as needed. 10/01/19   Rolan Bucco, MD     Critical care time: Elizebeth Brooking Anjelina Dung ACNP Acute Care Nurse Practitioner [facility] Pulmonary/Critical Care Please consult Amion 07/14/2022, 10:30 AM

## 2022-07-14 NOTE — Progress Notes (Signed)
ANTICOAGULATION CONSULT NOTE - Initial Consult  Pharmacy Consult for heparin Indication: chest pain/ACS  Allergies  Allergen Reactions   Metformin And Related Other (See Comments)    Weakness & fatigue    Patient Measurements: Height: 5\' 8"  (172.7 cm) Weight: 101.4 kg (223 lb 9.6 oz) IBW/kg (Calculated) : 63.9 Heparin Dosing Weight: 86 kg  Vital Signs: Temp: 98.2 F (36.8 C) (06/19 1122) Temp Source: Oral (06/19 1122) BP: 128/75 (06/19 1531) Pulse Rate: 0 (06/19 1536)  Labs: Recent Labs    07/13/22 2244 07/13/22 2245 07/13/22 2355 07/14/22 0000 07/14/22 0749 07/14/22 1227  HGB  --  12.2 12.9  --   --   --   HCT  --  38.5 40.0  --   --   --   PLT  --  225 224  --   --   --   APTT 46*  --   --   --   --   --   LABPROT  --  15.1  --   --   --   --   INR  --  1.2  --   --   --   --   HEPARINUNFRC 0.24*  --   --   --  0.16*  --   CREATININE  --  0.70  --   --  0.55 0.54  TROPONINIHS  --  3,215*  --  2,338*  --   --     Estimated Creatinine Clearance: 87.3 mL/min (by C-G formula based on SCr of 0.54 mg/dL).   Medical History: Past Medical History:  Diagnosis Date   Anemia    nos   Anxiety    Depression    Diabetes mellitus without complication (HCC)    Hypothyroidism     Assessment: 67 yo W transferred from [facility] ED for chest pain and troponin elevation. Heparin 4400 units given at noon and 960 units/hr ordered. Per RN, patient arrived with heparin running. No anticoagulation prior to admission. Pharmacy consulted for heparin.      Patient is s/p cath with severe 3 vessel disease. Plan to restart heparin 2 hrs after TR band off and consult for CABG. Per RN, TR band off around 5pm.  Goal of Therapy:  Heparin level 0.3-0.7 units/ml Monitor platelets by anticoagulation protocol: Yes   Plan:  Restart heparin 1300 units/hr at 19:00    Monitor daily heparin level, CBC Monitor for signs/symptoms of bleeding    Alphia Moh, PharmD, BCPS,  BCCP Clinical Pharmacist  Please check AMION for all [facility] Pharmacy phone numbers After 10:00 PM, call Main Pharmacy [phone]

## 2022-07-14 NOTE — Progress Notes (Signed)
Echocardiogram 2D Echocardiogram has been performed.  Alisha Williams 07/14/2022, 11:30 AM

## 2022-07-14 NOTE — Progress Notes (Addendum)
Labs reviewed PH 7.39. Gap closed. Cr 0.55. Bicarb 22. Na 133. Mag 2.1. K 3.7. HDL 35. LDL unable calculate. Triglycerides 442. Glucose 138-184 since AM.   Will start Novolog AC, semglee 10u at bedtime. Stop Insulin gtt and D5. Start Zetia 10mg .   LHC planned today.

## 2022-07-14 NOTE — TOC CM/SW Note (Signed)
Transition of Care [facility]) - Inpatient Brief Assessment   Patient Details  Name: Alisha Williams MRN: [id number] Date of Birth: [birth year]  [facility] CM/SW Contact:    Harriet Masson, RN Phone Number: 07/14/2022, 12:15 PM   Clinical Narrative:  Patient admitted for NSTEMI. Plan for heath cath.  DKA, A11.  She was to be scheduled with an endocrinologist as an outpatient. TOC following.   Transition of Care Asessment: Insurance and Status: Insurance coverage has been reviewed Patient has primary care physician: Yes Home environment has been reviewed: safe to discharge home when medically stable Prior level of function:: fine at home Prior/Current Home Services: No current home services Social Determinants of Health Reivew: SDOH reviewed no interventions necessary Readmission risk has been reviewed: Yes Transition of care needs: no transition of care needs at this time

## 2022-07-15 ENCOUNTER — Inpatient Hospital Stay (HOSPITAL_COMMUNITY): Payer: 59

## 2022-07-15 ENCOUNTER — Other Ambulatory Visit (HOSPITAL_COMMUNITY): Payer: Self-pay

## 2022-07-15 ENCOUNTER — Encounter (HOSPITAL_COMMUNITY): Payer: Self-pay | Admitting: Interventional Cardiology

## 2022-07-15 DIAGNOSIS — I214 Non-ST elevation (NSTEMI) myocardial infarction: Secondary | ICD-10-CM

## 2022-07-15 DIAGNOSIS — I509 Heart failure, unspecified: Secondary | ICD-10-CM

## 2022-07-15 DIAGNOSIS — E1310 Other specified diabetes mellitus with ketoacidosis without coma: Secondary | ICD-10-CM

## 2022-07-15 DIAGNOSIS — I2510 Atherosclerotic heart disease of native coronary artery without angina pectoris: Secondary | ICD-10-CM

## 2022-07-15 LAB — CBC
HCT: 42.8 % (ref 36.0–46.0)
Hemoglobin: 13.4 g/dL (ref 12.0–15.0)
MCH: 26.3 pg (ref 26.0–34.0)
MCHC: 31.3 g/dL (ref 30.0–36.0)
MCV: 84.1 fL (ref 80.0–100.0)
Platelets: 213 10*3/uL (ref 150–400)
RBC: 5.09 MIL/uL (ref 3.87–5.11)
RDW: 13.6 % (ref 11.5–15.5)
WBC: 7.1 10*3/uL (ref 4.0–10.5)
nRBC: 0 % (ref 0.0–0.2)

## 2022-07-15 LAB — HEPARIN LEVEL (UNFRACTIONATED)
Heparin Unfractionated: <0.1 IU/mL — ABNORMAL LOW (ref 0.30–0.70)
Heparin Unfractionated: 0.21 IU/mL — ABNORMAL LOW (ref 0.30–0.70)
Heparin Unfractionated: 0.41 IU/mL (ref 0.30–0.70)

## 2022-07-15 LAB — PULMONARY FUNCTION TEST
FEF 25-75 Pre: 1.28 L/sec
FEF2575-%Pred-Pre: 54 %
FEV1-%Pred-Pre: 62 %
FEV1-Pre: 1.74 L
FEV1FVC-%Pred-Pre: 93 %
FEV6-%Pred-Pre: 68 %
FEV6-Pre: 2.4 L
FEV6FVC-%Pred-Pre: 103 %
FVC-%Pred-Pre: 66 %
FVC-Pre: 2.42 L
Pre FEV1/FVC ratio: 72 %
Pre FEV6/FVC Ratio: 100 %

## 2022-07-15 LAB — BASIC METABOLIC PANEL
Anion gap: 14 (ref 5–15)
BUN: 14 mg/dL (ref 8–23)
CO2: 20 mmol/L — ABNORMAL LOW (ref 22–32)
Calcium: 8.4 mg/dL — ABNORMAL LOW (ref 8.9–10.3)
Chloride: 100 mmol/L (ref 98–111)
Creatinine, Ser: 0.69 mg/dL (ref 0.44–1.00)
GFR, Estimated: >60 mL/min (ref >60)
Glucose, Bld: 154 mg/dL — ABNORMAL HIGH (ref 70–99)
Potassium: 3.7 mmol/L (ref 3.5–5.1)
Sodium: 134 mmol/L — ABNORMAL LOW (ref 135–145)

## 2022-07-15 LAB — BETA-HYDROXYBUTYRIC ACID: Beta-Hydroxybutyric Acid: 2.76 mmol/L — ABNORMAL HIGH (ref 0.05–0.27)

## 2022-07-15 LAB — GLUCOSE, CAPILLARY
Glucose-Capillary: 236 mg/dL — ABNORMAL HIGH (ref 70–99)
Glucose-Capillary: 260 mg/dL — ABNORMAL HIGH (ref 70–99)
Glucose-Capillary: 179 mg/dL — ABNORMAL HIGH (ref 70–99)
Glucose-Capillary: 292 mg/dL — ABNORMAL HIGH (ref 70–99)

## 2022-07-15 LAB — LIPOPROTEIN A (LPA): Lipoprotein (a): 238.2 nmol/L — ABNORMAL HIGH (ref <75.0)

## 2022-07-15 MED ORDER — INSULIN STARTER KIT- PEN NEEDLES (ENGLISH)
1.0000 | Freq: Once | Status: AC
  Administered 2022-07-15: 1
  Filled 2022-07-15 (×2): qty 1

## 2022-07-15 MED ORDER — INSULIN GLARGINE-YFGN 100 UNIT/ML ~~LOC~~ SOLN
16.0000 [IU] | Freq: Every day | SUBCUTANEOUS | Status: DC
  Administered 2022-07-15: 16 [IU] via SUBCUTANEOUS
  Filled 2022-07-15 (×2): qty 0.16

## 2022-07-15 MED ORDER — LIVING WELL WITH DIABETES BOOK
Freq: Once | Status: AC
  Filled 2022-07-15: qty 1

## 2022-07-15 MED ORDER — POTASSIUM CHLORIDE CRYS ER 20 MEQ PO TBCR
40.0000 meq | EXTENDED_RELEASE_TABLET | Freq: Once | ORAL | Status: AC
  Administered 2022-07-15: 40 meq via ORAL
  Filled 2022-07-15: qty 2

## 2022-07-15 NOTE — H&P (View-Only) (Signed)
Reason for Consult: Three-vessel coronary disease Referring Physician: Dr. Varanasi  Alisha Williams is an 66 y.o. female.  HPI: Alisha Williams presents with a chief complaint of chest pain.  Alisha Williams is a 67-year-old woman with a history of obesity, poorly controlled type 2 diabetes, hypothyroidism, anxiety, depression, and anemia.  She presented with a 1 week history of substernal chest pain with radiation to her throat accompanied by headache, nausea, and shortness of breath with exertion.  Over the course of the week her chest pain progressed and she also noted orthopnea, bloating, and early satiety.  She saw her primary and mention chest pain.  An ECG was concerning for an acute coronary syndrome and she was transferred to the ED where she was diagnosed with a non-ST elevation MI with a troponin of 2338.  Noted to be in heart failure with a BNP of 218..  Also noted to be in DKA.  She was transferred to [facility] for further evaluation.  Yesterday she underwent cardiac catheterization which revealed severe three-vessel coronary disease.  She has been pain-free since admission.  Past Medical History:  Diagnosis Date   Anemia    nos   Anxiety    Depression    Diabetes mellitus without complication (HCC)    Hypothyroidism     Past Surgical History:  Procedure Laterality Date   CESAREAN SECTION     LEFT HEART CATH AND CORONARY ANGIOGRAPHY N/A 07/14/2022   Procedure: LEFT HEART CATH AND CORONARY ANGIOGRAPHY;  Surgeon: Varanasi, Jayadeep S, MD;  Location: [facility];  Service: Cardiovascular;  Laterality: N/A;   strectch kidney tube  1970   TONSILLECTOMY     TUBAL LIGATION      Family History  Problem Relation Age of Onset   Thyroid disease Mother    Hypertension Mother    Arthritis Mother    Fibromyalgia Mother    Breast cancer Mother    Heart attack Father    Breast cancer Sister    Thyroid disease Sister     Social History:  reports that she has never smoked. She does not  have any smokeless tobacco history on file. She reports that she does not drink alcohol. No history on file for drug use.  Allergies:  Allergies  Allergen Reactions   Metformin And Related Other (See Comments)    Weakness & fatigue    Medications: Scheduled:  aspirin EC  81 mg Oral Daily   atorvastatin  80 mg Oral Daily   ezetimibe  10 mg Oral Daily   furosemide  40 mg Intravenous Q12H   insulin aspart  0-9 Units Subcutaneous TID WC   insulin glargine-yfgn  10 Units Subcutaneous QHS   levothyroxine  125 mcg Oral Daily   metoprolol tartrate  12.5 mg Oral BID   sodium chloride flush  3 mL Intravenous Q12H    Results for orders placed or performed during the hospital encounter of 07/13/22 (from the past 48 hour(s))  MRSA Next Gen by PCR, Nasal     Status: None   Collection Time: 07/13/22  9:47 PM   Specimen: Nasal Mucosa; Nasal Swab  Result Value Ref Range   MRSA by PCR Next Gen NOT DETECTED NOT DETECTED    Comment: (NOTE) The GeneXpert MRSA Assay (FDA approved for NASAL specimens only), is one component of a comprehensive MRSA colonization surveillance program. It is not intended to diagnose MRSA infection nor to guide or monitor treatment for MRSA infections. Test performance is not FDA   approved in patients less than 2 years old. Performed at [facility], [address] [place], [place] [address]   APTT     Status: Abnormal   Collection Time: 07/13/22 10:44 PM  Result Value Ref Range   aPTT 46 (H) 24 - 36 seconds    Comment:        IF BASELINE aPTT IS ELEVATED, SUGGEST PATIENT RISK ASSESSMENT BE USED TO DETERMINE APPROPRIATE ANTICOAGULANT THERAPY. Performed at [facility], [address] [place], [place] [address]   Heparin level (unfractionated)     Status: Abnormal   Collection Time: 07/13/22 10:44 PM  Result Value Ref Range   Heparin Unfractionated 0.24 (L) 0.30 - 0.70 IU/mL    Comment: (NOTE) The clinical reportable range upper limit is being  lowered to >1.10 to align with the FDA approved guidance for the current laboratory assay.  If heparin results are below expected values, and patient dosage has  been confirmed, suggest follow up testing of antithrombin III levels. Performed at [facility], [address] [place], [place] [address]   Troponin I (High Sensitivity)     Status: Abnormal   Collection Time: 07/13/22 10:45 PM  Result Value Ref Range   Troponin I (High Sensitivity) 3,215 (HH) <18 ng/L    Comment: CRITICAL RESULT CALLED TO, READ BACK BY AND VERIFIED WITH E. FIERKE, RN. [id number] 07/13/22. LPAIT (NOTE) Elevated high sensitivity troponin I (hsTnI) values and significant  changes across serial measurements may suggest ACS but many other  chronic and acute conditions are known to elevate hsTnI results.  Refer to the "Links" section for chest pain algorithms and additional  guidance. Performed at [facility], [address] [place], [place] [address]   Comprehensive metabolic panel     Status: Abnormal   Collection Time: 07/13/22 10:45 PM  Result Value Ref Range   Sodium 136 135 - 145 mmol/L   Potassium 3.7 3.5 - 5.1 mmol/L   Chloride 98 98 - 111 mmol/L   CO2 19 (L) 22 - 32 mmol/L   Glucose, Bld 206 (H) 70 - 99 mg/dL    Comment: Glucose reference range applies only to samples taken after fasting for at least 8 hours.   BUN 13 8 - 23 mg/dL   Creatinine, Ser 0.70 0.44 - 1.00 mg/dL   Calcium 8.6 (L) 8.9 - 10.3 mg/dL   Total Protein 6.7 6.5 - 8.1 g/dL   Albumin 3.5 3.5 - 5.0 g/dL   AST 31 15 - 41 U/L   ALT 21 0 - 44 U/L   Alkaline Phosphatase 47 38 - 126 U/L   Total Bilirubin 1.7 (H) 0.3 - 1.2 mg/dL   GFR, Estimated >60 >60 mL/min    Comment: (NOTE) Calculated using the CKD-EPI Creatinine Equation (2021)    Anion gap 19 (H) 5 - 15    Comment: Performed at [facility], [address] [place], [place] [address]  CBC with Differential/Platelet     Status: Abnormal   Collection Time:  07/13/22 10:45 PM  Result Value Ref Range   WBC 7.9 4.0 - 10.5 K/uL   RBC 4.61 3.87 - 5.11 MIL/uL   Hemoglobin 12.2 12.0 - 15.0 g/dL   HCT 38.5 36.0 - 46.0 %   MCV 83.5 80.0 - 100.0 fL   MCH 26.5 26.0 - 34.0 pg   MCHC 31.7 30.0 - 36.0 g/dL   RDW 13.8 11.5 - 15.5 %     Platelets 225 150 - 400 K/uL   nRBC 0.0 0.0 - 0.2 %   Neutrophils Relative % 36 %   Neutro Abs 2.9 1.7 - 7.7 K/uL   Lymphocytes Relative 54 %   Lymphs Abs 4.3 (H) 0.7 - 4.0 K/uL   Monocytes Relative 8 %   Monocytes Absolute 0.6 0.1 - 1.0 K/uL   Eosinophils Relative 2 %   Eosinophils Absolute 0.1 0.0 - 0.5 K/uL   Basophils Relative 0 %   Basophils Absolute 0.0 0.0 - 0.1 K/uL   Immature Granulocytes 0 %   Abs Immature Granulocytes 0.02 0.00 - 0.07 K/uL    Comment: Performed at [facility], [address] [place], [place] [address]  TSH     Status: None   Collection Time: 07/13/22 10:45 PM  Result Value Ref Range   TSH 4.071 0.350 - 4.500 uIU/mL    Comment: Performed by a 3rd Generation assay with a functional sensitivity of <=0.01 uIU/mL. Performed at [facility], [address] [place], [place] [address]   HIV Antibody (routine testing w rflx)     Status: None   Collection Time: 07/13/22 10:45 PM  Result Value Ref Range   HIV Screen 4th Generation wRfx Non Reactive Non Reactive    Comment: Performed at [facility], [address] [place], [place] [address]  Protime-INR     Status: None   Collection Time: 07/13/22 10:45 PM  Result Value Ref Range   Prothrombin Time 15.1 11.4 - 15.2 seconds   INR 1.2 0.8 - 1.2    Comment: (NOTE) INR goal varies based on device and disease states. Performed at [facility], [address] [place], [place] [address]   Hemoglobin A1c     Status: Abnormal   Collection Time: 07/13/22 10:45 PM  Result Value Ref Range   Hgb A1c MFr Bld 11.2 (H) 4.8 - 5.6 %    Comment: (NOTE) Pre diabetes:          5.7%-6.4%  Diabetes:               >6.4%  Glycemic control for   <7.0% adults with diabetes    Mean Plasma Glucose 274.74 mg/dL    Comment: Performed at [facility], [address] , [place] [address]  Lipoprotein A (LPA)     Status: Abnormal   Collection Time: 07/13/22 11:55 PM  Result Value Ref Range   Lipoprotein (a) 238.2 (H) <75.0 nmol/L    Comment: (NOTE) Note:  Values greater than or equal to 75.0 nmol/L may       indicate an independent risk factor for CHD,       but must be evaluated with caution when applied       to non-Caucasian populations due to the       influence of genetic factors on Lp(a) across       ethnicities. Performed At: [facility] [address] [place], [place] [id number] Nagendra Sanjai MD Ph:[phone]   CBC     Status: None   Collection Time: 07/13/22 11:55 PM  Result Value Ref Range   WBC 8.0 4.0 - 10.5 K/uL   RBC 4.71 3.87 - 5.11 MIL/uL   Hemoglobin 12.9 12.0 - 15.0 g/dL   HCT 40.0 36.0 - 46.0 %   MCV 84.9 80.0 - 100.0 fL   MCH 27.4 26.0 - 34.0 pg   MCHC 32.3 30.0 - 36.0 g/dL     RDW 13.7 11.5 - 15.5 %   Platelets 224 150 - 400 K/uL   nRBC 0.0 0.0 - 0.2 %    Comment: Performed at [facility], [address] [place], [place] [address]  Troponin I (High Sensitivity)     Status: Abnormal   Collection Time: 07/14/22 12:00 AM  Result Value Ref Range   Troponin I (High Sensitivity) 2,338 (HH) <18 ng/L    Comment: CRITICAL VALUE NOTED. VALUE IS CONSISTENT WITH PREVIOUSLY REPORTED/CALLED VALUE (NOTE) Elevated high sensitivity troponin I (hsTnI) values and significant  changes across serial measurements may suggest ACS but many other  chronic and acute conditions are known to elevate hsTnI results.  Refer to the "Links" section for chest pain algorithms and additional  guidance. Performed at [facility], [address] [place], [place] [address]   Beta-hydroxybutyric acid     Status: Abnormal   Collection Time: 07/14/22 12:00 AM  Result  Value Ref Range   Beta-Hydroxybutyric Acid 3.24 (H) 0.05 - 0.27 mmol/L    Comment: Performed at [facility], [address] [place], [place] [address]  Glucose, capillary     Status: Abnormal   Collection Time: 07/14/22  4:04 AM  Result Value Ref Range   Glucose-Capillary 241 (H) 70 - 99 mg/dL    Comment: Glucose reference range applies only to samples taken after fasting for at least 8 hours.  Glucose, capillary     Status: Abnormal   Collection Time: 07/14/22  5:11 AM  Result Value Ref Range   Glucose-Capillary 184 (H) 70 - 99 mg/dL    Comment: Glucose reference range applies only to samples taken after fasting for at least 8 hours.  Glucose, capillary     Status: Abnormal   Collection Time: 07/14/22  6:15 AM  Result Value Ref Range   Glucose-Capillary 173 (H) 70 - 99 mg/dL    Comment: Glucose reference range applies only to samples taken after fasting for at least 8 hours.  Glucose, capillary     Status: Abnormal   Collection Time: 07/14/22  7:18 AM  Result Value Ref Range   Glucose-Capillary 154 (H) 70 - 99 mg/dL    Comment: Glucose reference range applies only to samples taken after fasting for at least 8 hours.  Brain natriuretic peptide     Status: Abnormal   Collection Time: 07/14/22  7:43 AM  Result Value Ref Range   B Natriuretic Peptide 217.7 (H) 0.0 - 100.0 pg/mL    Comment: Performed at [facility], [address] [place], [place] [address]  Heparin level (unfractionated)     Status: Abnormal   Collection Time: 07/14/22  7:49 AM  Result Value Ref Range   Heparin Unfractionated 0.16 (L) 0.30 - 0.70 IU/mL    Comment: (NOTE) The clinical reportable range upper limit is being lowered to >1.10 to align with the FDA approved guidance for the current laboratory assay.  If heparin results are below expected values, and patient dosage has  been confirmed, suggest follow up testing of antithrombin III levels. Performed at [facility], [address] [place], [place] [address]   Beta-hydroxybutyric acid     Status: Abnormal   Collection Time: 07/14/22  7:49 AM  Result Value Ref Range   Beta-Hydroxybutyric Acid 0.81 (H) 0.05 - 0.27 mmol/L    Comment: Performed at  Hospital Lab, [address] [place], [place] [address]  Blood gas, venous       Status: Abnormal   Collection Time: 07/14/22  7:49 AM  Result Value Ref Range   pH, Ven 7.39 7.25 - 7.43   pCO2, Ven 44 44 - 60 mmHg   pO2, Ven <31 (LL) 32 - 45 mmHg    Comment: CRITICAL RESULT CALLED TO, READ BACK BY AND VERIFIED WITH: S. CATON, RN 0806 07/14/2022 BY MACEDA, J.    Bicarbonate 27.2 20.0 - 28.0 mmol/L   Acid-Base Excess 1.7 0.0 - 2.0 mmol/L   O2 Saturation 57.5 %   Patient temperature 36.7    Collection site BLOOD LEFT HAND    Drawn by [id number]     Comment: Performed at [facility], [address] [place], [place] [address]  Basic metabolic panel     Status: Abnormal   Collection Time: 07/14/22  7:49 AM  Result Value Ref Range   Sodium 133 (L) 135 - 145 mmol/L   Potassium 3.7 3.5 - 5.1 mmol/L   Chloride 101 98 - 111 mmol/L   CO2 22 22 - 32 mmol/L   Glucose, Bld 146 (H) 70 - 99 mg/dL    Comment: Glucose reference range applies only to samples taken after fasting for at least 8 hours.   BUN 13 8 - 23 mg/dL   Creatinine, Ser 0.55 0.44 - 1.00 mg/dL   Calcium 8.1 (L) 8.9 - 10.3 mg/dL   GFR, Estimated >60 >60 mL/min    Comment: (NOTE) Calculated using the CKD-EPI Creatinine Equation (2021)    Anion gap 10 5 - 15    Comment: Performed at [facility], [address] [place], [place] [address]  Magnesium     Status: None   Collection Time: 07/14/22  7:49 AM  Result Value Ref Range   Magnesium 2.1 1.7 - 2.4 mg/dL    Comment: Performed at  Hospital Lab, [address] , [place] [address]  Lipid panel     Status: Abnormal   Collection Time: 07/14/22  7:49 AM  Result Value Ref Range   Cholesterol 223 (H) 0 - 200 mg/dL   Triglycerides 442 (H)  <150 mg/dL   HDL 35 (L) >40 mg/dL   Total CHOL/HDL Ratio 6.4 RATIO   VLDL UNABLE TO CALCULATE IF TRIGLYCERIDE OVER 400 mg/dL 0 - 40 mg/dL   LDL Cholesterol UNABLE TO CALCULATE IF TRIGLYCERIDE OVER 400 mg/dL 0 - 99 mg/dL    Comment:        Total Cholesterol/HDL:CHD Risk Coronary Heart Disease Risk Table                     Men   Women  1/2 Average Risk   3.4   3.3  Average Risk       5.0   4.4  2 X Average Risk   9.6   7.1  3 X Average Risk  23.4   11.0        Use the calculated Patient Ratio above and the CHD Risk Table to determine the patient's CHD Risk.        ATP III CLASSIFICATION (LDL):  <100     mg/dL   Optimal  100-129  mg/dL   Near or Above                    Optimal  130-159  mg/dL   Borderline  160-189  mg/dL   High  >190     mg/dL   Very High Performed at [facility], [address] [place], [place] [address]   LDL cholesterol, direct     Status: Abnormal   Collection Time: 07/14/22  7:49 AM  Result Value Ref Range   Direct LDL 124 (H) 0 - 99 mg/dL    Comment: Performed at [facility], [address] [place], [place] [address]  Glucose, capillary     Status: Abnormal   Collection Time: 07/14/22  8:34 AM  Result Value Ref Range   Glucose-Capillary 148 (H) 70 - 99 mg/dL    Comment: Glucose reference range applies only to samples taken after fasting for at least 8 hours.  Glucose, capillary     Status: Abnormal   Collection Time: 07/14/22  9:23 AM  Result Value Ref Range   Glucose-Capillary 150 (H) 70 - 99 mg/dL    Comment: Glucose reference range applies only to samples taken after fasting for at least 8 hours.  Glucose, capillary     Status: Abnormal   Collection Time: 07/14/22 10:35 AM  Result Value Ref Range   Glucose-Capillary 138 (H) 70 - 99 mg/dL    Comment: Glucose reference range applies only to samples taken after fasting for at least 8 hours.  Glucose, capillary     Status: Abnormal   Collection Time: 07/14/22 11:23 AM  Result Value  Ref Range   Glucose-Capillary 174 (H) 70 - 99 mg/dL    Comment: Glucose reference range applies only to samples taken after fasting for at least 8 hours.  Basic metabolic panel     Status: Abnormal   Collection Time: 07/14/22 12:27 PM  Result Value Ref Range   Sodium 133 (L) 135 - 145 mmol/L   Potassium 3.9 3.5 - 5.1 mmol/L   Chloride 100 98 - 111 mmol/L   CO2 21 (L) 22 - 32 mmol/L   Glucose, Bld 173 (H) 70 - 99 mg/dL    Comment: Glucose reference range applies only to samples taken after fasting for at least 8 hours.   BUN 11 8 - 23 mg/dL   Creatinine, Ser 0.54 0.44 - 1.00 mg/dL   Calcium 8.0 (L) 8.9 - 10.3 mg/dL   GFR, Estimated >60 >60 mL/min    Comment: (NOTE) Calculated using the CKD-EPI Creatinine Equation (2021)    Anion gap 12 5 - 15    Comment: Performed at [facility], [address] [place], [place] [address]  Phosphorus     Status: None   Collection Time: 07/14/22 12:27 PM  Result Value Ref Range   Phosphorus 2.9 2.5 - 4.6 mg/dL    Comment: Performed at [facility], [address] [place], [place] [address]  Lactic acid, plasma     Status: None   Collection Time: 07/14/22 12:27 PM  Result Value Ref Range   Lactic Acid, Venous 1.1 0.5 - 1.9 mmol/L    Comment: Performed at [facility], [address] [place], [place] [address]  TSH     Status: Abnormal   Collection Time: 07/14/22 12:27 PM  Result Value Ref Range   TSH 5.031 (H) 0.350 - 4.500 uIU/mL    Comment: Performed by a 3rd Generation assay with a functional sensitivity of <=0.01 uIU/mL. Performed at [facility], [address] [place], [place] [address]   Glucose, capillary     Status: Abnormal   Collection Time: 07/14/22  4:28 PM  Result Value Ref Range   Glucose-Capillary 165 (H) 70 - 99   mg/dL    Comment: Glucose reference range applies only to samples taken after fasting for at least 8 hours.  Basic metabolic panel     Status: Abnormal   Collection Time: 07/14/22  4:49 PM   Result Value Ref Range   Sodium 132 (L) 135 - 145 mmol/L   Potassium 3.9 3.5 - 5.1 mmol/L   Chloride 101 98 - 111 mmol/L   CO2 18 (L) 22 - 32 mmol/L   Glucose, Bld 160 (H) 70 - 99 mg/dL    Comment: Glucose reference range applies only to samples taken after fasting for at least 8 hours.   BUN 11 8 - 23 mg/dL   Creatinine, Ser 0.58 0.44 - 1.00 mg/dL   Calcium 8.2 (L) 8.9 - 10.3 mg/dL   GFR, Estimated >60 >60 mL/min    Comment: (NOTE) Calculated using the CKD-EPI Creatinine Equation (2021)    Anion gap 13 5 - 15    Comment: Performed at [facility], [address] [place], [place] [address]  Beta-hydroxybutyric acid     Status: Abnormal   Collection Time: 07/14/22  4:49 PM  Result Value Ref Range   Beta-Hydroxybutyric Acid 2.82 (H) 0.05 - 0.27 mmol/L    Comment: Performed at [facility], [address] [place], [place] [address]  Lactic acid, plasma     Status: None   Collection Time: 07/14/22  4:49 PM  Result Value Ref Range   Lactic Acid, Venous 1.1 0.5 - 1.9 mmol/L    Comment: Performed at [facility], [address] [place], [place] [address]  Basic metabolic panel     Status: Abnormal   Collection Time: 07/14/22  6:02 PM  Result Value Ref Range   Sodium 129 (L) 135 - 145 mmol/L   Potassium 4.5 3.5 - 5.1 mmol/L   Chloride 99 98 - 111 mmol/L   CO2 15 (L) 22 - 32 mmol/L   Glucose, Bld 242 (H) 70 - 99 mg/dL    Comment: Glucose reference range applies only to samples taken after fasting for at least 8 hours.   BUN 10 8 - 23 mg/dL   Creatinine, Ser 0.87 0.44 - 1.00 mg/dL   Calcium 8.0 (L) 8.9 - 10.3 mg/dL   GFR, Estimated >60 >60 mL/min    Comment: (NOTE) Calculated using the CKD-EPI Creatinine Equation (2021)    Anion gap 15 5 - 15    Comment: Performed at [facility], [address] [place], [place] [address]  Glucose, capillary     Status: Abnormal   Collection Time: 07/14/22  9:20 PM  Result Value Ref Range   Glucose-Capillary 206 (H)  70 - 99 mg/dL    Comment: Glucose reference range applies only to samples taken after fasting for at least 8 hours.  Basic metabolic panel     Status: Abnormal   Collection Time: 07/15/22 12:03 AM  Result Value Ref Range   Sodium 134 (L) 135 - 145 mmol/L   Potassium 3.7 3.5 - 5.1 mmol/L   Chloride 100 98 - 111 mmol/L   CO2 20 (L) 22 - 32 mmol/L   Glucose, Bld 154 (H) 70 - 99 mg/dL    Comment: Glucose reference range applies only to samples taken after fasting for at least 8 hours.   BUN 14 8 - 23 mg/dL   Creatinine, Ser 0.69 0.44 - 1.00 mg/dL   Calcium 8.4 (L) 8.9 - 10.3 mg/dL   GFR, Estimated >  60 >60 mL/min    Comment: (NOTE) Calculated using the CKD-EPI Creatinine Equation (2021)    Anion gap 14 5 - 15    Comment: Performed at [facility], [address] [place], [place] [address]  Beta-hydroxybutyric acid     Status: Abnormal   Collection Time: 07/15/22 12:03 AM  Result Value Ref Range   Beta-Hydroxybutyric Acid 2.76 (H) 0.05 - 0.27 mmol/L    Comment: Performed at [facility], [address] [place], [place] [address]  CBC     Status: None   Collection Time: 07/15/22 12:03 AM  Result Value Ref Range   WBC 7.1 4.0 - 10.5 K/uL   RBC 5.09 3.87 - 5.11 MIL/uL   Hemoglobin 13.4 12.0 - 15.0 g/dL   HCT 42.8 36.0 - 46.0 %   MCV 84.1 80.0 - 100.0 fL   MCH 26.3 26.0 - 34.0 pg   MCHC 31.3 30.0 - 36.0 g/dL   RDW 13.6 11.5 - 15.5 %   Platelets 213 150 - 400 K/uL   nRBC 0.0 0.0 - 0.2 %    Comment: Performed at [facility], [address] [place], [place] [address]  Heparin level (unfractionated)     Status: Abnormal   Collection Time: 07/15/22 12:03 AM  Result Value Ref Range   Heparin Unfractionated <0.10 (L) 0.30 - 0.70 IU/mL    Comment: (NOTE) The clinical reportable range upper limit is being lowered to >1.10 to align with the FDA approved guidance for the current laboratory assay.  If heparin results are below expected values, and patient dosage has   been confirmed, suggest follow up testing of antithrombin III levels. Performed at [facility], [address] [place], [place] [address]   Glucose, capillary     Status: Abnormal   Collection Time: 07/15/22  6:18 AM  Result Value Ref Range   Glucose-Capillary 236 (H) 70 - 99 mg/dL    Comment: Glucose reference range applies only to samples taken after fasting for at least 8 hours.  Heparin level (unfractionated)     Status: Abnormal   Collection Time: 07/15/22  8:18 AM  Result Value Ref Range   Heparin Unfractionated 0.21 (L) 0.30 - 0.70 IU/mL    Comment: (NOTE) The clinical reportable range upper limit is being lowered to >1.10 to align with the FDA approved guidance for the current laboratory assay.  If heparin results are below expected values, and patient dosage has  been confirmed, suggest follow up testing of antithrombin III levels. Performed at [facility], [address] [place], [place] [address]     CARDIAC CATHETERIZATION  Result Date: 07/14/2022   Prox LAD to Mid LAD lesion is 80% stenosed.   Mid RCA lesion is 99% stenosed.   Prox Cx to Mid Cx lesion is 90% stenosed.   1st Mrg lesion is 90% stenosed.   There is mild left ventricular systolic dysfunction.   LV end diastolic pressure is moderately elevated.   The left ventricular ejection fraction is 45-50% by visual estimate.   There is no aortic valve stenosis.   Anticipated discharge date to be determined. Severe three vessel CAD.  Culprit lesion is the heavily calcified subtotally occluded mid RCA.  Volume overload which is likely contributing to her oxygen requirement.  IV Lasix ordered post catheterization.  Cardiac surgery consultation.  She will need to be optimized medically prior to surgery. Results discussed with husband, Alisha Williams [phone]     ECHOCARDIOGRAM COMPLETE  Result Date: 07/14/2022    ECHOCARDIOGRAM REPORT   Patient Name:   Alisha Williams Date of Exam: 07/14/2022 Medical Rec #:   [id number]   Height:       68.0 in Accession #:    [id number]  Weight:       223.6 lb Date of Birth:  06/01/1955  BSA:          2.143 m Patient Age:    66 years    BP:           100/59 mmHg Patient Gender: F           HR:           89 bpm. Exam Location:  Inpatient Procedure: 2D Echo, Cardiac Doppler and Color Doppler Indications:    NSTEMI  History:        Patient has no prior history of Echocardiogram examinations.                 Previous Myocardial Infarction; Risk Factors:Diabetes.  Sonographer:    Shanika Turnbull Referring Phys: [id number] BELAL A SULEIMAN IMPRESSIONS  1. Left ventricular ejection fraction, by estimation, is 50 to 55%. The left ventricle has low normal function. The left ventricle demonstrates regional wall motion abnormalities (see scoring diagram/findings for description). There is mild left ventricular hypertrophy of the basal-septal segment. Left ventricular diastolic parameters are indeterminate. Elevated left ventricular end-diastolic pressure. There is severe hypokinesis of the left ventricular, basal inferior wall. There is severe hypokinesis of the left ventricular, apical inferior wall. There is severe hypokinesis of the left ventricular, entire inferolateral wall.  2. Right ventricular systolic function is normal. The right ventricular size is normal. There is moderately elevated pulmonary artery systolic pressure. The estimated right ventricular systolic pressure is 54.5 mmHg.  3. The mitral valve is normal in structure. Mild mitral valve regurgitation. No evidence of mitral stenosis.  4. The aortic valve is tricuspid. Aortic valve regurgitation is not visualized. Aortic valve sclerosis/calcification is present, without any evidence of aortic stenosis. Aortic valve Vmax measures 1.22 m/s.  5. The inferior vena cava is dilated in size with >50% respiratory variability, suggesting right atrial pressure of 8 mmHg. FINDINGS  Left Ventricle: Left ventricular ejection fraction, by  estimation, is 50 to 55%. The left ventricle has low normal function. The left ventricle demonstrates regional wall motion abnormalities. Severe hypokinesis of the left ventricular, basal inferior wall. Severe hypokinesis of the left ventricular, apical inferior wall. Severe hypokinesis of the left ventricular, entire inferolateral wall. The left ventricular internal cavity size was normal in size. There is mild left ventricular hypertrophy of the  basal-septal segment. Left ventricular diastolic parameters are indeterminate. Elevated left ventricular end-diastolic pressure. Right Ventricle: The right ventricular size is normal. No increase in right ventricular wall thickness. Right ventricular systolic function is normal. There is moderately elevated pulmonary artery systolic pressure. The tricuspid regurgitant velocity is 3.41 m/s, and with an assumed right atrial pressure of 8 mmHg, the estimated right ventricular systolic pressure is 54.5 mmHg. Left Atrium: Left atrial size was normal in size. Right Atrium: Right atrial size was normal in size. Pericardium: There is no evidence of pericardial effusion. Mitral Valve: The mitral valve is normal in structure. Mild mitral annular calcification. Mild mitral valve regurgitation. No evidence of mitral valve stenosis. Tricuspid Valve: The tricuspid valve is normal in structure. Tricuspid valve regurgitation is trivial. No evidence of tricuspid stenosis. Aortic Valve: The aortic valve is tricuspid. Aortic valve regurgitation   is not visualized. Aortic valve sclerosis/calcification is present, without any evidence of aortic stenosis. Aortic valve peak gradient measures 6.0 mmHg. Pulmonic Valve: The pulmonic valve was normal in structure. Pulmonic valve regurgitation is trivial. No evidence of pulmonic stenosis. Aorta: The aortic root is normal in size and structure. Venous: The inferior vena cava is dilated in size with greater than 50% respiratory variability, suggesting  right atrial pressure of 8 mmHg. IAS/Shunts: No atrial level shunt detected by color flow Doppler.  LEFT VENTRICLE PLAX 2D LVIDd:         5.40 cm   Diastology LVIDs:         4.00 cm   LV e' medial:    5.64 cm/s LV PW:         1.10 cm   LV E/e' medial:  20.6 LV IVS:        1.20 cm   LV e' lateral:   7.80 cm/s LVOT diam:     2.10 cm   LV E/e' lateral: 14.9 LV SV:         59 LV SV Index:   27 LVOT Area:     3.46 cm  RIGHT VENTRICLE             IVC RV S prime:     10.60 cm/s  IVC diam: 2.60 cm TAPSE (M-mode): 1.8 cm LEFT ATRIUM           Index        RIGHT ATRIUM           Index LA diam:      4.50 cm 2.10 cm/m   RA Area:     10.60 cm LA Vol (A2C): 51.1 ml 23.85 ml/m  RA Volume:   23.20 ml  10.83 ml/m LA Vol (A4C): 55.4 ml 25.85 ml/m  AORTIC VALVE AV Area (Vmax): 2.66 cm AV Vmax:        122.00 cm/s AV Peak Grad:   6.0 mmHg LVOT Vmax:      93.60 cm/s LVOT Vmean:     63.000 cm/s LVOT VTI:       0.170 m  AORTA Ao Root diam: 2.60 cm Ao Asc diam:  3.70 cm MITRAL VALVE                TRICUSPID VALVE MV Area (PHT): 4.68 cm     TR Peak grad:   46.5 mmHg MV Decel Time: 162 msec     TR Vmax:        341.00 cm/s MR Peak grad: 73.6 mmHg MR Vmax:      429.00 cm/s   SHUNTS MV E velocity: 116.00 cm/s  Systemic VTI:  0.17 m MV A velocity: 78.30 cm/s   Systemic Diam: 2.10 cm MV E/A ratio:  1.48 Traci Turner MD Electronically signed by Traci Turner MD Signature Date/Time: 07/14/2022/12:43:41 PM    Final     I personally reviewed the cardiac catheterization images.  EF approximately 45% with severe three-vessel coronary disease.  Total occlusion of the RCA.  Targets appear fair quality.  Review of Systems  Constitutional:  Positive for activity change and fatigue.  HENT:  Negative for trouble swallowing and voice change.   Respiratory:  Positive for shortness of breath.   Cardiovascular:  Positive for chest pain.  Endocrine: Positive for polydipsia and polyuria.  Genitourinary:  Positive for frequency.  Neurological:   Negative for seizures and weakness.   Blood pressure 121/67, pulse 88, temperature 98.3 F (36.8 C),   temperature source Oral, resp. rate 16, height 5' 8" (1.727 m), weight 100.7 kg, SpO2 95 %. Physical Exam Vitals reviewed.  Constitutional:      General: She is not in acute distress.    Appearance: She is obese.  HENT:     Head: Normocephalic and atraumatic.  Eyes:     General: No scleral icterus.    Extraocular Movements: Extraocular movements intact.  Cardiovascular:     Rate and Rhythm: Normal rate and regular rhythm.     Heart sounds: Murmur (Faint systolic) heard.     No friction rub. No gallop.  Pulmonary:     Effort: Pulmonary effort is normal.     Breath sounds: No wheezing or rales.     Comments: Diminished breath sounds both bases Abdominal:     General: There is no distension.     Palpations: Abdomen is soft.     Tenderness: There is no abdominal tenderness.  Musculoskeletal:     Right lower leg: No edema.     Left lower leg: No edema.  Skin:    General: Skin is warm and dry.  Neurological:     General: No focal deficit present.     Mental Status: She is alert and oriented to person, place, and time.     Cranial Nerves: No cranial nerve deficit.     Motor: No weakness.     Assessment/Plan:  Alisha Williams is a 66-year-old woman with a history of obesity, poorly controlled type 2 diabetes, hypothyroidism, anxiety, depression, and anemia.  She presented with a 1 week history of substernal chest pain with radiation to her throat accompanied by headache, nausea, and shortness of breath with exertion.  She ruled in for non-ST elevation MI and also was noted to be in heart failure and diabetic ketoacidosis.  Three-vessel coronary disease status post non-ST elevation MI-coronary bypass grafting is indicated for survival benefit and relief of symptoms.  She is relatively high risk due to her obesity, diffusely diseased coronaries, and poorly controlled diabetes.  I  discussed coronary artery bypass grafting with Alisha Williams.  I informed her of the general nature of the procedure including the need for general anesthesia, the incisions to be used, the use of cardiopulmonary bypass, the use of drainage tubes and temporary pacemaker wires postoperatively, the expected hospital stay, and the overall recovery.  I informed her of the indications, risks, benefits, and alternatives.  She understands the risks include, but not limited to death, MI, stroke, DVT, PE, bleeding, possible need for transfusion, infection, cardiac arrhythmias, respiratory or renal failure, as well as the possibility of other unforeseeable complications.  She accepts the risks and wishes to proceed with surgery.  Type 2 diabetes-poorly controlled.  DKA on admission.  Issues with access to medications.  Hemoglobin A1c 11.2 on admission.  Needs optimization prior to surgery.  Hypothyroidism-TSH elevated.  On Synthroid.  Plan: Medical optimization with diuresis and control of diabetes Tentatively plan for coronary bypass grafting on Wednesday, 07/21/2022  Renetta Suman C Sergio Zawislak 07/15/2022, 10:42 AM      

## 2022-07-15 NOTE — Progress Notes (Signed)
Ok to increase Semglee to 16 units/day per [employer]  Ulyses Southward, PharmD, BCIDP, AAHIVP, CPP Infectious Disease Pharmacist 07/15/2022 12:52 PM

## 2022-07-15 NOTE — Progress Notes (Signed)
ANTICOAGULATION CONSULT NOTE - Initial Consult  Pharmacy Consult for heparin Indication: chest pain/ACS  Allergies  Allergen Reactions   Metformin And Related Other (See Comments)    Weakness & fatigue    Patient Measurements: Height: 5\' 8"  (172.7 cm) Weight: 100.7 kg (222 lb 0.1 oz) IBW/kg (Calculated) : 63.9 Heparin Dosing Weight: 86 kg  Vital Signs: Temp: 98.2 F (36.8 C) (06/20 1621) Temp Source: Axillary (06/20 1621) BP: 119/79 (06/20 1621) Pulse Rate: 84 (06/20 1621)  Labs: Recent Labs    07/13/22 2244 07/13/22 2245 07/13/22 2245 07/13/22 2355 07/14/22 0000 07/14/22 0749 07/14/22 1649 07/14/22 1802 07/15/22 0003 07/15/22 0818 07/15/22 1619  HGB  --  12.2   < > 12.9  --   --   --   --  13.4  --   --   HCT  --  38.5  --  40.0  --   --   --   --  42.8  --   --   PLT  --  225  --  224  --   --   --   --  213  --   --   APTT 46*  --   --   --   --   --   --   --   --   --   --   LABPROT  --  15.1  --   --   --   --   --   --   --   --   --   INR  --  1.2  --   --   --   --   --   --   --   --   --   HEPARINUNFRC 0.24*  --   --   --   --    < >  --   --  <0.10* 0.21* 0.41  CREATININE  --  0.70  --   --   --    < > 0.58 0.87 0.69  --   --   TROPONINIHS  --  3,215*  --   --  2,338*  --   --   --   --   --   --    < > = values in this interval not displayed.    Estimated Creatinine Clearance: 87 mL/min (by C-G formula based on SCr of 0.69 mg/dL).   Medical History: Past Medical History:  Diagnosis Date   Anemia    nos   Anxiety    Depression    Diabetes mellitus without complication (HCC)    Hypothyroidism     Assessment: 67 yo W transferred from [facility] ED for chest pain and troponin elevation. Heparin 4400 units given at noon and 960 units/hr ordered. Per RN, patient arrived with heparin running. Patient is s/p cath with severe 3 vessel disease. No anticoagulation prior to admission. Pharmacy consulted for heparin.     Heparin level 0.41 is  therapeutic on 1700 units/hr.  Tentative CABG possibly on 6/26.   Goal of Therapy:  Heparin level 0.3-0.7 units/ml Monitor platelets by anticoagulation protocol: Yes   Plan:  Continue heparin 1700 units/hr Monitor daily heparin level, CBC Monitor for signs/symptoms of bleeding   Alphia Moh, PharmD, BCPS, BCCP Clinical Pharmacist  Please check AMION for all [facility] Pharmacy phone numbers After 10:00 PM, call Main Pharmacy [phone]

## 2022-07-15 NOTE — Progress Notes (Signed)
Rounding Note    Patient Name: Alisha Williams Date of Encounter: 07/15/2022  [facility] Cardiologist: Dr Jens Som  Subjective   No CP; dyspnea improved  Inpatient Medications    Scheduled Meds:  aspirin EC  81 mg Oral Daily   atorvastatin  80 mg Oral Daily   ezetimibe  10 mg Oral Daily   furosemide  40 mg Intravenous Q12H   insulin aspart  0-9 Units Subcutaneous TID WC   insulin glargine-yfgn  10 Units Subcutaneous QHS   levothyroxine  125 mcg Oral Daily   metoprolol tartrate  12.5 mg Oral BID   sodium chloride flush  3 mL Intravenous Q12H   Continuous Infusions:  sodium chloride     heparin 1,500 Units/hr (07/15/22 0406)   PRN Meds: sodium chloride, acetaminophen, acetaminophen, dextrose, nitroGLYCERIN, ondansetron (ZOFRAN) IV, ondansetron (ZOFRAN) IV, sodium chloride flush   Vital Signs    Vitals:   07/14/22 2003 07/14/22 2004 07/14/22 2309 07/15/22 0350  BP:  135/74 106/74 (!) 107/95  Pulse:      Resp:  19  20  Temp:  98.5 F (36.9 C) 98.3 F (36.8 C) 97.8 F (36.6 C)  TempSrc: Oral Oral Oral Oral  SpO2:      Weight:    100.7 kg  Height:        Intake/Output Summary (Last 24 hours) at 07/15/2022 0714 Last data filed at 07/15/2022 1610 Gross per 24 hour  Intake 2662.17 ml  Output 1400 ml  Net 1262.17 ml      07/15/2022    3:50 AM 07/14/2022    5:00 AM 07/13/2022    9:46 PM  Last 3 Weights  Weight (lbs) 222 lb 0.1 oz 223 lb 9.6 oz 223 lb 9.6 oz  Weight (kg) 100.7 kg 101.424 kg 101.424 kg      Telemetry    Sinus - Personally Reviewed   Physical Exam   GEN: No acute distress.   Neck: No JVD Cardiac: RRR, no murmurs, rubs, or gallops.  Respiratory: Clear to auscultation bilaterally. GI: Soft, nontender, non-distended  MS: No edema; radial cath site with no hematoma Neuro:  Nonfocal  Psych: Normal affect   Labs    High Sensitivity Troponin:   Recent Labs  Lab 07/13/22 2245 07/14/22 0000  TROPONINIHS 3,215* 2,338*      Chemistry Recent Labs  Lab 07/13/22 2245 07/14/22 0749 07/14/22 1227 07/14/22 1649 07/14/22 1802 07/15/22 0003  NA 136 133*   < > 132* 129* 134*  K 3.7 3.7   < > 3.9 4.5 3.7  CL 98 101   < > 101 99 100  CO2 19* 22   < > 18* 15* 20*  GLUCOSE 206* 146*   < > 160* 242* 154*  BUN 13 13   < > 11 10 14   CREATININE 0.70 0.55   < > 0.58 0.87 0.69  CALCIUM 8.6* 8.1*   < > 8.2* 8.0* 8.4*  MG  --  2.1  --   --   --   --   PROT 6.7  --   --   --   --   --   ALBUMIN 3.5  --   --   --   --   --   AST 31  --   --   --   --   --   ALT 21  --   --   --   --   --   ALKPHOS 47  --   --   --   --   --  BILITOT 1.7*  --   --   --   --   --   GFRNONAA >60 >60   < > >60 >60 >60  ANIONGAP 19* 10   < > 13 15 14    < > = values in this interval not displayed.    Lipids  Recent Labs  Lab 07/14/22 0749  CHOL 223*  TRIG 442*  HDL 35*  LDLCALC UNABLE TO CALCULATE IF TRIGLYCERIDE OVER 400 mg/dL  CHOLHDL 6.4    Hematology Recent Labs  Lab 07/13/22 2245 07/13/22 2355 07/15/22 0003  WBC 7.9 8.0 7.1  RBC 4.61 4.71 5.09  HGB 12.2 12.9 13.4  HCT 38.5 40.0 42.8  MCV 83.5 84.9 84.1  MCH 26.5 27.4 26.3  MCHC 31.7 32.3 31.3  RDW 13.8 13.7 13.6  PLT 225 224 213   Thyroid  Recent Labs  Lab 07/14/22 1227  TSH 5.031*    BNP Recent Labs  Lab 07/14/22 0743  BNP 217.7*     Radiology    CARDIAC CATHETERIZATION  Result Date: 07/14/2022   Prox LAD to Mid LAD lesion is 80% stenosed.   Mid RCA lesion is 99% stenosed.   Prox Cx to Mid Cx lesion is 90% stenosed.   1st Mrg lesion is 90% stenosed.   There is mild left ventricular systolic dysfunction.   LV end diastolic pressure is moderately elevated.   The left ventricular ejection fraction is 45-50% by visual estimate.   There is no aortic valve stenosis.   Anticipated discharge date to be determined. Severe three vessel CAD.  Culprit lesion is the heavily calcified subtotally occluded mid RCA.  Volume overload which is likely contributing to her  oxygen requirement.  IV Lasix ordered post catheterization.  Cardiac surgery consultation.  She will need to be optimized medically prior to surgery. Results discussed with husband, Alisha Williams [phone]   ECHOCARDIOGRAM COMPLETE  Result Date: 07/14/2022    ECHOCARDIOGRAM REPORT   Patient Name:   Alisha Williams Date of Exam: 07/14/2022 Medical Rec #:  [id number]   Height:       68.0 in Accession #:    [id number]  Weight:       223.6 lb Date of Birth:  November 22, 1955  BSA:          2.143 m Patient Age:    66 years    BP:           100/59 mmHg Patient Gender: F           HR:           89 bpm. Exam Location:  Inpatient Procedure: 2D Echo, Cardiac Doppler and Color Doppler Indications:    NSTEMI  History:        Patient has no prior history of Echocardiogram examinations.                 Previous Myocardial Infarction; Risk Factors:Diabetes.  Sonographer:    Lucendia Herrlich Referring Phys: [id number] BELAL A SULEIMAN IMPRESSIONS  1. Left ventricular ejection fraction, by estimation, is 50 to 55%. The left ventricle has low normal function. The left ventricle demonstrates regional wall motion abnormalities (see scoring diagram/findings for description). There is mild left ventricular hypertrophy of the basal-septal segment. Left ventricular diastolic parameters are indeterminate. Elevated left ventricular end-diastolic pressure. There is severe hypokinesis of the left ventricular, basal inferior wall. There is severe hypokinesis of the left ventricular, apical inferior wall. There is severe hypokinesis of the left ventricular, entire inferolateral wall.  2. Right ventricular systolic function is normal. The right ventricular size is normal. There is moderately elevated pulmonary artery systolic pressure. The estimated right ventricular systolic pressure is 54.5 mmHg.  3. The mitral valve is normal in structure. Mild mitral valve regurgitation. No evidence of mitral stenosis.  4. The aortic valve is tricuspid. Aortic valve  regurgitation is not visualized. Aortic valve sclerosis/calcification is present, without any evidence of aortic stenosis. Aortic valve Vmax measures 1.22 m/s.  5. The inferior vena cava is dilated in size with >50% respiratory variability, suggesting right atrial pressure of 8 mmHg. FINDINGS  Left Ventricle: Left ventricular ejection fraction, by estimation, is 50 to 55%. The left ventricle has low normal function. The left ventricle demonstrates regional wall motion abnormalities. Severe hypokinesis of the left ventricular, basal inferior wall. Severe hypokinesis of the left ventricular, apical inferior wall. Severe hypokinesis of the left ventricular, entire inferolateral wall. The left ventricular internal cavity size was normal in size. There is mild left ventricular hypertrophy of the  basal-septal segment. Left ventricular diastolic parameters are indeterminate. Elevated left ventricular end-diastolic pressure. Right Ventricle: The right ventricular size is normal. No increase in right ventricular wall thickness. Right ventricular systolic function is normal. There is moderately elevated pulmonary artery systolic pressure. The tricuspid regurgitant velocity is 3.41 m/s, and with an assumed right atrial pressure of 8 mmHg, the estimated right ventricular systolic pressure is 54.5 mmHg. Left Atrium: Left atrial size was normal in size. Right Atrium: Right atrial size was normal in size. Pericardium: There is no evidence of pericardial effusion. Mitral Valve: The mitral valve is normal in structure. Mild mitral annular calcification. Mild mitral valve regurgitation. No evidence of mitral valve stenosis. Tricuspid Valve: The tricuspid valve is normal in structure. Tricuspid valve regurgitation is trivial. No evidence of tricuspid stenosis. Aortic Valve: The aortic valve is tricuspid. Aortic valve regurgitation is not visualized. Aortic valve sclerosis/calcification is present, without any evidence of aortic  stenosis. Aortic valve peak gradient measures 6.0 mmHg. Pulmonic Valve: The pulmonic valve was normal in structure. Pulmonic valve regurgitation is trivial. No evidence of pulmonic stenosis. Aorta: The aortic root is normal in size and structure. Venous: The inferior vena cava is dilated in size with greater than 50% respiratory variability, suggesting right atrial pressure of 8 mmHg. IAS/Shunts: No atrial level shunt detected by color flow Doppler.  LEFT VENTRICLE PLAX 2D LVIDd:         5.40 cm   Diastology LVIDs:         4.00 cm   LV e' medial:    5.64 cm/s LV PW:         1.10 cm   LV E/e' medial:  20.6 LV IVS:        1.20 cm   LV e' lateral:   7.80 cm/s LVOT diam:     2.10 cm   LV E/e' lateral: 14.9 LV SV:         59 LV SV Index:   27 LVOT Area:     3.46 cm  RIGHT VENTRICLE             IVC RV S prime:     10.60 cm/s  IVC diam: 2.60 cm TAPSE (M-mode): 1.8 cm LEFT ATRIUM           Index        RIGHT ATRIUM           Index LA diam:      4.50 cm 2.10 cm/m  RA Area:     10.60 cm LA Vol (A2C): 51.1 ml 23.85 ml/m  RA Volume:   23.20 ml  10.83 ml/m LA Vol (A4C): 55.4 ml 25.85 ml/m  AORTIC VALVE AV Area (Vmax): 2.66 cm AV Vmax:        122.00 cm/s AV Peak Grad:   6.0 mmHg LVOT Vmax:      93.60 cm/s LVOT Vmean:     63.000 cm/s LVOT VTI:       0.170 m  AORTA Ao Root diam: 2.60 cm Ao Asc diam:  3.70 cm MITRAL VALVE                TRICUSPID VALVE MV Area (PHT): 4.68 cm     TR Peak grad:   46.5 mmHg MV Decel Time: 162 msec     TR Vmax:        341.00 cm/s MR Peak grad: 73.6 mmHg MR Vmax:      429.00 cm/s   SHUNTS MV E velocity: 116.00 cm/s  Systemic VTI:  0.17 m MV A velocity: 78.30 cm/s   Systemic Diam: 2.10 cm MV E/A ratio:  1.48 Armanda Magic MD Electronically signed by Armanda Magic MD Signature Date/Time: 07/14/2022/12:43:41 PM    Final       Patient Profile     67 y.o. female with past medical history of diabetes mellitus, hypothyroidism admitted with non-ST elevation myocardial infarction.  Cardiac  catheterization revealed severe three-vessel coronary artery disease, ejection fraction 45 to 50% and elevated left ventricular end-diastolic pressure.  Echocardiogram shows ejection fraction 50 to 55%, mild left ventricular hypertrophy, hypokinesis of the basal inferior wall and inferolateral wall, mild mitral regurgitation.  Assessment & Plan    1 non-ST elevation myocardial infarction-patient remains pain-free.  Continue aspirin, heparin, statin and metoprolol.  Cardiac catheterization reveals severe three-vessel coronary artery disease and echocardiogram shows preserved LV function.  Plan will be cardiothoracic surgery consult for coronary artery bypass and graft.  2 acute diastolic congestive heart failure-left ventricular end-diastolic pressure elevated at time of cardiac catheterization.  Will continue gentle diuresis and follow renal function.  3 diabetes mellitus-continue present regimen.  Follow CBGs.  For questions or updates, please contact [facility] Please consult [email or web address] for contact info under        Signed, Olga Millers, MD  07/15/2022, 7:14 AM

## 2022-07-15 NOTE — Inpatient Diabetes Management (Addendum)
Inpatient Diabetes Program Recommendations  AACE/ADA: New Consensus Statement on Inpatient Glycemic Control (2015)  Target Ranges:  Prepandial:   less than 140 mg/dL      Peak postprandial:   less than 180 mg/dL (1-2 hours)      Critically ill patients:  140 - 180 mg/dL   Lab Results  Component Value Date   GLUCAP 236 (H) 07/15/2022   HGBA1C 11.2 (H) 07/13/2022    Review of Glycemic Control  Latest Reference Range & Units 07/14/22 16:28 07/14/22 21:20 07/15/22 06:18  Glucose-Capillary 70 - 99 mg/dL 161 (H) 096 (H) 045 (H)  (H): Data is abnormally high Diabetes history: Type 2 DM Outpatient Diabetes medications: Metformin 1500 mg every day, Victoza 1.2 mg every day, Farxiga 10 mg QD Current orders for Inpatient glycemic control: Semglee 10 units at bedtime, Novolog 0-9 units TID  Inpatient Diabetes Program Recommendations:    Consider increasing Semglee 16 units at bedtime.  Will plan to see.   Addendum@1400 : Spoke with patient and family regarding outpatient diabetes management.  Reviewed patient's current A1c of 11.0%. Explained what a A1c is and what it measures. Also reviewed goal A1c with patient, importance of good glucose control @ home, and blood sugar goals. Reviewed patho of DM, DKA, role of pancreas, importance of establishing good glycemic control, risk for postop infection, survival skills, intervention, vascular changes and commorbidities.  Patient has a meter and supplies. Discussed CGM with patient and she is interested. Secure chat sent to pharmacy to begin process for benefits check.  Denies drinking sugary beverages and has a diet that is lower in CHOs. Reviewed importance of carb alottment per meal, plate method, importance of protein and encouraged continued mindfulness. Will need additional education on insulin pens following surgery. Our team will plan to follow.    Thanks, Lujean Rave, MSN, RNC-OB Diabetes Coordinator [phone] (8a-5p)

## 2022-07-15 NOTE — Consult Note (Signed)
Reason for Consult: Three-vessel coronary disease Referring Physician: Dr. Gray Bernhardt is an 67 y.o. female.  HPI: Alisha Williams presents with a chief complaint of chest pain.  Alisha Williams is a 67 year old woman with a history of obesity, poorly controlled type 2 diabetes, hypothyroidism, anxiety, depression, and anemia.  She presented with a 1 week history of substernal chest pain with radiation to her throat accompanied by headache, nausea, and shortness of breath with exertion.  Over the course of the week her chest pain progressed and she also noted orthopnea, bloating, and early satiety.  She saw her primary and mention chest pain.  An ECG was concerning for an acute coronary syndrome and she was transferred to the ED where she was diagnosed with a non-ST elevation MI with a troponin of 2338.  Noted to be in heart failure with a BNP of 218.Marland Kitchen  Also noted to be in DKA.  She was transferred to [facility] for further evaluation.  Yesterday she underwent cardiac catheterization which revealed severe three-vessel coronary disease.  She has been pain-free since admission.  Past Medical History:  Diagnosis Date   Anemia    nos   Anxiety    Depression    Diabetes mellitus without complication (HCC)    Hypothyroidism     Past Surgical History:  Procedure Laterality Date   CESAREAN SECTION     LEFT HEART CATH AND CORONARY ANGIOGRAPHY N/A 07/14/2022   Procedure: LEFT HEART CATH AND CORONARY ANGIOGRAPHY;  Surgeon: Corky Crafts, MD;  Location: [facility] INVASIVE CV LAB;  Service: Cardiovascular;  Laterality: N/A;   strectch kidney tube  1970   TONSILLECTOMY     TUBAL LIGATION      Family History  Problem Relation Age of Onset   Thyroid disease Mother    Hypertension Mother    Arthritis Mother    Fibromyalgia Mother    Breast cancer Mother    Heart attack Father    Breast cancer Sister    Thyroid disease Sister     Social History:  reports that she has never smoked. She does not  have any smokeless tobacco history on file. She reports that she does not drink alcohol. No history on file for drug use.  Allergies:  Allergies  Allergen Reactions   Metformin And Related Other (See Comments)    Weakness & fatigue    Medications: Scheduled:  aspirin EC  81 mg Oral Daily   atorvastatin  80 mg Oral Daily   ezetimibe  10 mg Oral Daily   furosemide  40 mg Intravenous Q12H   insulin aspart  0-9 Units Subcutaneous TID WC   insulin glargine-yfgn  10 Units Subcutaneous QHS   levothyroxine  125 mcg Oral Daily   metoprolol tartrate  12.5 mg Oral BID   sodium chloride flush  3 mL Intravenous Q12H    Results for orders placed or performed during the hospital encounter of 07/13/22 (from the past 48 hour(s))  MRSA Next Gen by PCR, Nasal     Status: None   Collection Time: 07/13/22  9:47 PM   Specimen: Nasal Mucosa; Nasal Swab  Result Value Ref Range   MRSA by PCR Next Gen NOT DETECTED NOT DETECTED    Comment: (NOTE) The GeneXpert MRSA Assay (FDA approved for NASAL specimens only), is one component of a comprehensive MRSA colonization surveillance program. It is not intended to diagnose MRSA infection nor to guide or monitor treatment for MRSA infections. Test performance is not FDA  approved in patients less than 13 years old. Performed at [facility], [address], [place], [place] [address]   APTT     Status: Abnormal   Collection Time: 07/13/22 10:44 PM  Result Value Ref Range   aPTT 46 (H) 24 - 36 seconds    Comment:        IF BASELINE aPTT IS ELEVATED, SUGGEST PATIENT RISK ASSESSMENT BE USED TO DETERMINE APPROPRIATE ANTICOAGULANT THERAPY. Performed at [facility], [address] [place], [place] [address]   Heparin level (unfractionated)     Status: Abnormal   Collection Time: 07/13/22 10:44 PM  Result Value Ref Range   Heparin Unfractionated 0.24 (L) 0.30 - 0.70 IU/mL    Comment: (NOTE) The clinical reportable range upper limit is being  lowered to >1.10 to align with the FDA approved guidance for the current laboratory assay.  If heparin results are below expected values, and patient dosage has  been confirmed, suggest follow up testing of antithrombin III levels. Performed at [facility], [address] [place], [place] [address]   Troponin I (High Sensitivity)     Status: Abnormal   Collection Time: 07/13/22 10:45 PM  Result Value Ref Range   Troponin I (High Sensitivity) 3,215 (HH) <18 ng/L    Comment: CRITICAL RESULT CALLED TO, READ BACK BY AND VERIFIED WITH E. Dala Dock, RN. [phone] 07/13/22. LPAIT (NOTE) Elevated high sensitivity troponin I (hsTnI) values and significant  changes across serial measurements may suggest ACS but many other  chronic and acute conditions are known to elevate hsTnI results.  Refer to the "Links" section for chest pain algorithms and additional  guidance. Performed at [facility], [address], [place], [place] [address]   Comprehensive metabolic panel     Status: Abnormal   Collection Time: 07/13/22 10:45 PM  Result Value Ref Range   Sodium 136 135 - 145 mmol/L   Potassium 3.7 3.5 - 5.1 mmol/L   Chloride 98 98 - 111 mmol/L   CO2 19 (L) 22 - 32 mmol/L   Glucose, Bld 206 (H) 70 - 99 mg/dL    Comment: Glucose reference range applies only to samples taken after fasting for at least 8 hours.   BUN 13 8 - 23 mg/dL   Creatinine, Ser 2.13 0.44 - 1.00 mg/dL   Calcium 8.6 (L) 8.9 - 10.3 mg/dL   Total Protein 6.7 6.5 - 8.1 g/dL   Albumin 3.5 3.5 - 5.0 g/dL   AST 31 15 - 41 U/L   ALT 21 0 - 44 U/L   Alkaline Phosphatase 47 38 - 126 U/L   Total Bilirubin 1.7 (H) 0.3 - 1.2 mg/dL   GFR, Estimated >08 >65 mL/min    Comment: (NOTE) Calculated using the CKD-EPI Creatinine Equation (2021)    Anion gap 19 (H) 5 - 15    Comment: Performed at [facility], [address] [place], [place] [address]  CBC with Differential/Platelet     Status: Abnormal   Collection Time:  07/13/22 10:45 PM  Result Value Ref Range   WBC 7.9 4.0 - 10.5 K/uL   RBC 4.61 3.87 - 5.11 MIL/uL   Hemoglobin 12.2 12.0 - 15.0 g/dL   HCT 62.9 52.8 - 41.3 %   MCV 83.5 80.0 - 100.0 fL   MCH 26.5 26.0 - 34.0 pg   MCHC 31.7 30.0 - 36.0 g/dL   RDW 24.4 01.0 - 27.2 %  Platelets 225 150 - 400 K/uL   nRBC 0.0 0.0 - 0.2 %   Neutrophils Relative % 36 %   Neutro Abs 2.9 1.7 - 7.7 K/uL   Lymphocytes Relative 54 %   Lymphs Abs 4.3 (H) 0.7 - 4.0 K/uL   Monocytes Relative 8 %   Monocytes Absolute 0.6 0.1 - 1.0 K/uL   Eosinophils Relative 2 %   Eosinophils Absolute 0.1 0.0 - 0.5 K/uL   Basophils Relative 0 %   Basophils Absolute 0.0 0.0 - 0.1 K/uL   Immature Granulocytes 0 %   Abs Immature Granulocytes 0.02 0.00 - 0.07 K/uL    Comment: Performed at [facility], [address], [place], [place] [address]  TSH     Status: None   Collection Time: 07/13/22 10:45 PM  Result Value Ref Range   TSH 4.071 0.350 - 4.500 uIU/mL    Comment: Performed by a 3rd Generation assay with a functional sensitivity of <=0.01 uIU/mL. Performed at [facility], [address] [place], [place] [address]   HIV Antibody (routine testing w rflx)     Status: None   Collection Time: 07/13/22 10:45 PM  Result Value Ref Range   HIV Screen 4th Generation wRfx Non Reactive Non Reactive    Comment: Performed at [facility], [address], [place], [place] [address]  Protime-INR     Status: None   Collection Time: 07/13/22 10:45 PM  Result Value Ref Range   Prothrombin Time 15.1 11.4 - 15.2 seconds   INR 1.2 0.8 - 1.2    Comment: (NOTE) INR goal varies based on device and disease states. Performed at [facility], [address], [place], [place] [address]   Hemoglobin A1c     Status: Abnormal   Collection Time: 07/13/22 10:45 PM  Result Value Ref Range   Hgb A1c MFr Bld 11.2 (H) 4.8 - 5.6 %    Comment: (NOTE) Pre diabetes:          5.7%-6.4%  Diabetes:               >6.4%  Glycemic control for   <7.0% adults with diabetes    Mean Plasma Glucose 274.74 mg/dL    Comment: Performed at [facility], [address], [place], [place] [address]  Lipoprotein A (LPA)     Status: Abnormal   Collection Time: 07/13/22 11:55 PM  Result Value Ref Range   Lipoprotein (a) 238.2 (H) <75.0 nmol/L    Comment: (NOTE) Note:  Values greater than or equal to 75.0 nmol/L may       indicate an independent risk factor for CHD,       but must be evaluated with caution when applied       to non-Caucasian populations due to the       influence of genetic factors on Lp(a) across       ethnicities. Performed At: [facility] [address] [place], [place] [id number] Jolene Schimke MD ON:[phone]   CBC     Status: None   Collection Time: 07/13/22 11:55 PM  Result Value Ref Range   WBC 8.0 4.0 - 10.5 K/uL   RBC 4.71 3.87 - 5.11 MIL/uL   Hemoglobin 12.9 12.0 - 15.0 g/dL   HCT 44.0 10.2 - 72.5 %   MCV 84.9 80.0 - 100.0 fL   MCH 27.4 26.0 - 34.0 pg   MCHC 32.3 30.0 - 36.0 g/dL  RDW 13.7 11.5 - 15.5 %   Platelets 224 150 - 400 K/uL   nRBC 0.0 0.0 - 0.2 %    Comment: Performed at [facility], [address] [place], [place] [address]  Troponin I (High Sensitivity)     Status: Abnormal   Collection Time: 07/14/22 12:00 AM  Result Value Ref Range   Troponin I (High Sensitivity) 2,338 (HH) <18 ng/L    Comment: CRITICAL VALUE NOTED. VALUE IS CONSISTENT WITH PREVIOUSLY REPORTED/CALLED VALUE (NOTE) Elevated high sensitivity troponin I (hsTnI) values and significant  changes across serial measurements may suggest ACS but many other  chronic and acute conditions are known to elevate hsTnI results.  Refer to the "Links" section for chest pain algorithms and additional  guidance. Performed at [facility], [address] [place], [place] [address]   Beta-hydroxybutyric acid     Status: Abnormal   Collection Time: 07/14/22 12:00 AM  Result  Value Ref Range   Beta-Hydroxybutyric Acid 3.24 (H) 0.05 - 0.27 mmol/L    Comment: Performed at [facility], [address], [place], [place] [address]  Glucose, capillary     Status: Abnormal   Collection Time: 07/14/22  4:04 AM  Result Value Ref Range   Glucose-Capillary 241 (H) 70 - 99 mg/dL    Comment: Glucose reference range applies only to samples taken after fasting for at least 8 hours.  Glucose, capillary     Status: Abnormal   Collection Time: 07/14/22  5:11 AM  Result Value Ref Range   Glucose-Capillary 184 (H) 70 - 99 mg/dL    Comment: Glucose reference range applies only to samples taken after fasting for at least 8 hours.  Glucose, capillary     Status: Abnormal   Collection Time: 07/14/22  6:15 AM  Result Value Ref Range   Glucose-Capillary 173 (H) 70 - 99 mg/dL    Comment: Glucose reference range applies only to samples taken after fasting for at least 8 hours.  Glucose, capillary     Status: Abnormal   Collection Time: 07/14/22  7:18 AM  Result Value Ref Range   Glucose-Capillary 154 (H) 70 - 99 mg/dL    Comment: Glucose reference range applies only to samples taken after fasting for at least 8 hours.  Brain natriuretic peptide     Status: Abnormal   Collection Time: 07/14/22  7:43 AM  Result Value Ref Range   B Natriuretic Peptide 217.7 (H) 0.0 - 100.0 pg/mL    Comment: Performed at [facility], [address] [place], [place] [address]  Heparin level (unfractionated)     Status: Abnormal   Collection Time: 07/14/22  7:49 AM  Result Value Ref Range   Heparin Unfractionated 0.16 (L) 0.30 - 0.70 IU/mL    Comment: (NOTE) The clinical reportable range upper limit is being lowered to >1.10 to align with the FDA approved guidance for the current laboratory assay.  If heparin results are below expected values, and patient dosage has  been confirmed, suggest follow up testing of antithrombin III levels. Performed at [facility], [address], [place], [place] [address]   Beta-hydroxybutyric acid     Status: Abnormal   Collection Time: 07/14/22  7:49 AM  Result Value Ref Range   Beta-Hydroxybutyric Acid 0.81 (H) 0.05 - 0.27 mmol/L    Comment: Performed at [facility], [address], [place], [place] [address]  Blood gas, venous  Status: Abnormal   Collection Time: 07/14/22  7:49 AM  Result Value Ref Range   pH, Ven 7.39 7.25 - 7.43   pCO2, Ven 44 44 - 60 mmHg   pO2, Ven <31 (LL) 32 - 45 mmHg    Comment: CRITICAL RESULT CALLED TO, READ BACK BY AND VERIFIED WITH: Fabio Pierce, RN [phone] 07/14/2022 BY MACEDA, J.    Bicarbonate 27.2 20.0 - 28.0 mmol/L   Acid-Base Excess 1.7 0.0 - 2.0 mmol/L   O2 Saturation 57.5 %   Patient temperature 36.7    Collection site BLOOD LEFT HAND    Drawn by [id number]     Comment: Performed at [facility], [address], [place], [place] [address]  Basic metabolic panel     Status: Abnormal   Collection Time: 07/14/22  7:49 AM  Result Value Ref Range   Sodium 133 (L) 135 - 145 mmol/L   Potassium 3.7 3.5 - 5.1 mmol/L   Chloride 101 98 - 111 mmol/L   CO2 22 22 - 32 mmol/L   Glucose, Bld 146 (H) 70 - 99 mg/dL    Comment: Glucose reference range applies only to samples taken after fasting for at least 8 hours.   BUN 13 8 - 23 mg/dL   Creatinine, Ser 1.19 0.44 - 1.00 mg/dL   Calcium 8.1 (L) 8.9 - 10.3 mg/dL   GFR, Estimated >14 >78 mL/min    Comment: (NOTE) Calculated using the CKD-EPI Creatinine Equation (2021)    Anion gap 10 5 - 15    Comment: Performed at [facility], [address] [place], [place] [address]  Magnesium     Status: None   Collection Time: 07/14/22  7:49 AM  Result Value Ref Range   Magnesium 2.1 1.7 - 2.4 mg/dL    Comment: Performed at [facility], [address], [place], [place] [address]  Lipid panel     Status: Abnormal   Collection Time: 07/14/22  7:49 AM  Result Value Ref Range   Cholesterol 223 (H) 0 - 200 mg/dL   Triglycerides 578 (H)  <150 mg/dL   HDL 35 (L) >46 mg/dL   Total CHOL/HDL Ratio 6.4 RATIO   VLDL UNABLE TO CALCULATE IF TRIGLYCERIDE OVER 400 mg/dL 0 - 40 mg/dL   LDL Cholesterol UNABLE TO CALCULATE IF TRIGLYCERIDE OVER 400 mg/dL 0 - 99 mg/dL    Comment:        Total Cholesterol/HDL:CHD Risk Coronary Heart Disease Risk Table                     Men   Women  1/2 Average Risk   3.4   3.3  Average Risk       5.0   4.4  2 X Average Risk   9.6   7.1  3 X Average Risk  23.4   11.0        Use the calculated Patient Ratio above and the CHD Risk Table to determine the patient's CHD Risk.        ATP III CLASSIFICATION (LDL):  <100     mg/dL   Optimal  962-952  mg/dL   Near or Above                    Optimal  130-159  mg/dL   Borderline  841-324  mg/dL   High  >401     mg/dL   Very High Performed at [facility], [address] [place], [place] [address]   LDL cholesterol, direct     Status: Abnormal   Collection Time: 07/14/22  7:49 AM  Result Value Ref Range   Direct LDL 124 (H) 0 - 99 mg/dL    Comment: Performed at [facility], [address] [place], [place] [address]  Glucose, capillary     Status: Abnormal   Collection Time: 07/14/22  8:34 AM  Result Value Ref Range   Glucose-Capillary 148 (H) 70 - 99 mg/dL    Comment: Glucose reference range applies only to samples taken after fasting for at least 8 hours.  Glucose, capillary     Status: Abnormal   Collection Time: 07/14/22  9:23 AM  Result Value Ref Range   Glucose-Capillary 150 (H) 70 - 99 mg/dL    Comment: Glucose reference range applies only to samples taken after fasting for at least 8 hours.  Glucose, capillary     Status: Abnormal   Collection Time: 07/14/22 10:35 AM  Result Value Ref Range   Glucose-Capillary 138 (H) 70 - 99 mg/dL    Comment: Glucose reference range applies only to samples taken after fasting for at least 8 hours.  Glucose, capillary     Status: Abnormal   Collection Time: 07/14/22 11:23 AM  Result Value  Ref Range   Glucose-Capillary 174 (H) 70 - 99 mg/dL    Comment: Glucose reference range applies only to samples taken after fasting for at least 8 hours.  Basic metabolic panel     Status: Abnormal   Collection Time: 07/14/22 12:27 PM  Result Value Ref Range   Sodium 133 (L) 135 - 145 mmol/L   Potassium 3.9 3.5 - 5.1 mmol/L   Chloride 100 98 - 111 mmol/L   CO2 21 (L) 22 - 32 mmol/L   Glucose, Bld 173 (H) 70 - 99 mg/dL    Comment: Glucose reference range applies only to samples taken after fasting for at least 8 hours.   BUN 11 8 - 23 mg/dL   Creatinine, Ser 0.98 0.44 - 1.00 mg/dL   Calcium 8.0 (L) 8.9 - 10.3 mg/dL   GFR, Estimated >11 >91 mL/min    Comment: (NOTE) Calculated using the CKD-EPI Creatinine Equation (2021)    Anion gap 12 5 - 15    Comment: Performed at [facility] Lab, [address], [place], [place] [address]  Phosphorus     Status: None   Collection Time: 07/14/22 12:27 PM  Result Value Ref Range   Phosphorus 2.9 2.5 - 4.6 mg/dL    Comment: Performed at [facility], [address], [place], [place] [address]  Lactic acid, plasma     Status: None   Collection Time: 07/14/22 12:27 PM  Result Value Ref Range   Lactic Acid, Venous 1.1 0.5 - 1.9 mmol/L    Comment: Performed at [facility], [address] [place], [place] [address]  TSH     Status: Abnormal   Collection Time: 07/14/22 12:27 PM  Result Value Ref Range   TSH 5.031 (H) 0.350 - 4.500 uIU/mL    Comment: Performed by a 3rd Generation assay with a functional sensitivity of <=0.01 uIU/mL. Performed at [facility], [address] [place], [place] [address]   Glucose, capillary     Status: Abnormal   Collection Time: 07/14/22  4:28 PM  Result Value Ref Range   Glucose-Capillary 165 (H) 70 - 99  mg/dL    Comment: Glucose reference range applies only to samples taken after fasting for at least 8 hours.  Basic metabolic panel     Status: Abnormal   Collection Time: 07/14/22  4:49 PM   Result Value Ref Range   Sodium 132 (L) 135 - 145 mmol/L   Potassium 3.9 3.5 - 5.1 mmol/L   Chloride 101 98 - 111 mmol/L   CO2 18 (L) 22 - 32 mmol/L   Glucose, Bld 160 (H) 70 - 99 mg/dL    Comment: Glucose reference range applies only to samples taken after fasting for at least 8 hours.   BUN 11 8 - 23 mg/dL   Creatinine, Ser 1.61 0.44 - 1.00 mg/dL   Calcium 8.2 (L) 8.9 - 10.3 mg/dL   GFR, Estimated >09 >60 mL/min    Comment: (NOTE) Calculated using the CKD-EPI Creatinine Equation (2021)    Anion gap 13 5 - 15    Comment: Performed at [facility], [address] [place], [place] [address]  Beta-hydroxybutyric acid     Status: Abnormal   Collection Time: 07/14/22  4:49 PM  Result Value Ref Range   Beta-Hydroxybutyric Acid 2.82 (H) 0.05 - 0.27 mmol/L    Comment: Performed at [facility], [address] [place], [place] [address]  Lactic acid, plasma     Status: None   Collection Time: 07/14/22  4:49 PM  Result Value Ref Range   Lactic Acid, Venous 1.1 0.5 - 1.9 mmol/L    Comment: Performed at [facility], [address], [place], [place] [address]  Basic metabolic panel     Status: Abnormal   Collection Time: 07/14/22  6:02 PM  Result Value Ref Range   Sodium 129 (L) 135 - 145 mmol/L   Potassium 4.5 3.5 - 5.1 mmol/L   Chloride 99 98 - 111 mmol/L   CO2 15 (L) 22 - 32 mmol/L   Glucose, Bld 242 (H) 70 - 99 mg/dL    Comment: Glucose reference range applies only to samples taken after fasting for at least 8 hours.   BUN 10 8 - 23 mg/dL   Creatinine, Ser 5.62 0.44 - 1.00 mg/dL   Calcium 8.0 (L) 8.9 - 10.3 mg/dL   GFR, Estimated >13 >08 mL/min    Comment: (NOTE) Calculated using the CKD-EPI Creatinine Equation (2021)    Anion gap 15 5 - 15    Comment: Performed at [facility], [address] [place] [address], [place], [place] [address]  Glucose, capillary     Status: Abnormal   Collection Time: 07/14/22  9:20 PM  Result Value Ref Range   Glucose-Capillary 206 (H)  70 - 99 mg/dL    Comment: Glucose reference range applies only to samples taken after fasting for at least 8 hours.  Basic metabolic panel     Status: Abnormal   Collection Time: 07/15/22 12:03 AM  Result Value Ref Range   Sodium 134 (L) 135 - 145 mmol/L   Potassium 3.7 3.5 - 5.1 mmol/L   Chloride 100 98 - 111 mmol/L   CO2 20 (L) 22 - 32 mmol/L   Glucose, Bld 154 (H) 70 - 99 mg/dL    Comment: Glucose reference range applies only to samples taken after fasting for at least 8 hours.   BUN 14 8 - 23 mg/dL   Creatinine, Ser 6.96 0.44 - 1.00 mg/dL   Calcium 8.4 (L) 8.9 - 10.3 mg/dL   GFR, Estimated >  60 >60 mL/min    Comment: (NOTE) Calculated using the CKD-EPI Creatinine Equation (2021)    Anion gap 14 5 - 15    Comment: Performed at [facility], [address], [place], [place] [address]  Beta-hydroxybutyric acid     Status: Abnormal   Collection Time: 07/15/22 12:03 AM  Result Value Ref Range   Beta-Hydroxybutyric Acid 2.76 (H) 0.05 - 0.27 mmol/L    Comment: Performed at [facility], [address], [place], [place] [address]  CBC     Status: None   Collection Time: 07/15/22 12:03 AM  Result Value Ref Range   WBC 7.1 4.0 - 10.5 K/uL   RBC 5.09 3.87 - 5.11 MIL/uL   Hemoglobin 13.4 12.0 - 15.0 g/dL   HCT 82.9 56.2 - 13.0 %   MCV 84.1 80.0 - 100.0 fL   MCH 26.3 26.0 - 34.0 pg   MCHC 31.3 30.0 - 36.0 g/dL   RDW 86.5 78.4 - 69.6 %   Platelets 213 150 - 400 K/uL   nRBC 0.0 0.0 - 0.2 %    Comment: Performed at [facility] Lab, [address], [place], [place] [address]  Heparin level (unfractionated)     Status: Abnormal   Collection Time: 07/15/22 12:03 AM  Result Value Ref Range   Heparin Unfractionated <0.10 (L) 0.30 - 0.70 IU/mL    Comment: (NOTE) The clinical reportable range upper limit is being lowered to >1.10 to align with the FDA approved guidance for the current laboratory assay.  If heparin results are below expected values, and patient dosage has   been confirmed, suggest follow up testing of antithrombin III levels. Performed at [facility], [address] [place], [place] [address]   Glucose, capillary     Status: Abnormal   Collection Time: 07/15/22  6:18 AM  Result Value Ref Range   Glucose-Capillary 236 (H) 70 - 99 mg/dL    Comment: Glucose reference range applies only to samples taken after fasting for at least 8 hours.  Heparin level (unfractionated)     Status: Abnormal   Collection Time: 07/15/22  8:18 AM  Result Value Ref Range   Heparin Unfractionated 0.21 (L) 0.30 - 0.70 IU/mL    Comment: (NOTE) The clinical reportable range upper limit is being lowered to >1.10 to align with the FDA approved guidance for the current laboratory assay.  If heparin results are below expected values, and patient dosage has  been confirmed, suggest follow up testing of antithrombin III levels. Performed at [facility], [address] [place] [address] [place], [place] [address]     CARDIAC CATHETERIZATION  Result Date: 07/14/2022   Prox LAD to Mid LAD lesion is 80% stenosed.   Mid RCA lesion is 99% stenosed.   Prox Cx to Mid Cx lesion is 90% stenosed.   1st Mrg lesion is 90% stenosed.   There is mild left ventricular systolic dysfunction.   LV end diastolic pressure is moderately elevated.   The left ventricular ejection fraction is 45-50% by visual estimate.   There is no aortic valve stenosis.   Anticipated discharge date to be determined. Severe three vessel CAD.  Culprit lesion is the heavily calcified subtotally occluded mid RCA.  Volume overload which is likely contributing to her oxygen requirement.  IV Lasix ordered post catheterization.  Cardiac surgery consultation.  She will need to be optimized medically prior to surgery. Results discussed with husband, Lonia Sweezey [phone]  ECHOCARDIOGRAM COMPLETE  Result Date: 07/14/2022    ECHOCARDIOGRAM REPORT   Patient Name:   Alisha Williams Date of Exam: 07/14/2022 Medical Rec #:   [id number]   Height:       68.0 in Accession #:    [id number]  Weight:       223.6 lb Date of Birth:  10/24/1955  BSA:          2.143 m Patient Age:    66 years    BP:           100/59 mmHg Patient Gender: F           HR:           89 bpm. Exam Location:  Inpatient Procedure: 2D Echo, Cardiac Doppler and Color Doppler Indications:    NSTEMI  History:        Patient has no prior history of Echocardiogram examinations.                 Previous Myocardial Infarction; Risk Factors:Diabetes.  Sonographer:    Lucendia Herrlich Referring Phys: [id number] BELAL A SULEIMAN IMPRESSIONS  1. Left ventricular ejection fraction, by estimation, is 50 to 55%. The left ventricle has low normal function. The left ventricle demonstrates regional wall motion abnormalities (see scoring diagram/findings for description). There is mild left ventricular hypertrophy of the basal-septal segment. Left ventricular diastolic parameters are indeterminate. Elevated left ventricular end-diastolic pressure. There is severe hypokinesis of the left ventricular, basal inferior wall. There is severe hypokinesis of the left ventricular, apical inferior wall. There is severe hypokinesis of the left ventricular, entire inferolateral wall.  2. Right ventricular systolic function is normal. The right ventricular size is normal. There is moderately elevated pulmonary artery systolic pressure. The estimated right ventricular systolic pressure is 54.5 mmHg.  3. The mitral valve is normal in structure. Mild mitral valve regurgitation. No evidence of mitral stenosis.  4. The aortic valve is tricuspid. Aortic valve regurgitation is not visualized. Aortic valve sclerosis/calcification is present, without any evidence of aortic stenosis. Aortic valve Vmax measures 1.22 m/s.  5. The inferior vena cava is dilated in size with >50% respiratory variability, suggesting right atrial pressure of 8 mmHg. FINDINGS  Left Ventricle: Left ventricular ejection fraction, by  estimation, is 50 to 55%. The left ventricle has low normal function. The left ventricle demonstrates regional wall motion abnormalities. Severe hypokinesis of the left ventricular, basal inferior wall. Severe hypokinesis of the left ventricular, apical inferior wall. Severe hypokinesis of the left ventricular, entire inferolateral wall. The left ventricular internal cavity size was normal in size. There is mild left ventricular hypertrophy of the  basal-septal segment. Left ventricular diastolic parameters are indeterminate. Elevated left ventricular end-diastolic pressure. Right Ventricle: The right ventricular size is normal. No increase in right ventricular wall thickness. Right ventricular systolic function is normal. There is moderately elevated pulmonary artery systolic pressure. The tricuspid regurgitant velocity is 3.41 m/s, and with an assumed right atrial pressure of 8 mmHg, the estimated right ventricular systolic pressure is 54.5 mmHg. Left Atrium: Left atrial size was normal in size. Right Atrium: Right atrial size was normal in size. Pericardium: There is no evidence of pericardial effusion. Mitral Valve: The mitral valve is normal in structure. Mild mitral annular calcification. Mild mitral valve regurgitation. No evidence of mitral valve stenosis. Tricuspid Valve: The tricuspid valve is normal in structure. Tricuspid valve regurgitation is trivial. No evidence of tricuspid stenosis. Aortic Valve: The aortic valve is tricuspid. Aortic valve regurgitation  is not visualized. Aortic valve sclerosis/calcification is present, without any evidence of aortic stenosis. Aortic valve peak gradient measures 6.0 mmHg. Pulmonic Valve: The pulmonic valve was normal in structure. Pulmonic valve regurgitation is trivial. No evidence of pulmonic stenosis. Aorta: The aortic root is normal in size and structure. Venous: The inferior vena cava is dilated in size with greater than 50% respiratory variability, suggesting  right atrial pressure of 8 mmHg. IAS/Shunts: No atrial level shunt detected by color flow Doppler.  LEFT VENTRICLE PLAX 2D LVIDd:         5.40 cm   Diastology LVIDs:         4.00 cm   LV e' medial:    5.64 cm/s LV PW:         1.10 cm   LV E/e' medial:  20.6 LV IVS:        1.20 cm   LV e' lateral:   7.80 cm/s LVOT diam:     2.10 cm   LV E/e' lateral: 14.9 LV SV:         59 LV SV Index:   27 LVOT Area:     3.46 cm  RIGHT VENTRICLE             IVC RV S prime:     10.60 cm/s  IVC diam: 2.60 cm TAPSE (M-mode): 1.8 cm LEFT ATRIUM           Index        RIGHT ATRIUM           Index LA diam:      4.50 cm 2.10 cm/m   RA Area:     10.60 cm LA Vol (A2C): 51.1 ml 23.85 ml/m  RA Volume:   23.20 ml  10.83 ml/m LA Vol (A4C): 55.4 ml 25.85 ml/m  AORTIC VALVE AV Area (Vmax): 2.66 cm AV Vmax:        122.00 cm/s AV Peak Grad:   6.0 mmHg LVOT Vmax:      93.60 cm/s LVOT Vmean:     63.000 cm/s LVOT VTI:       0.170 m  AORTA Ao Root diam: 2.60 cm Ao Asc diam:  3.70 cm MITRAL VALVE                TRICUSPID VALVE MV Area (PHT): 4.68 cm     TR Peak grad:   46.5 mmHg MV Decel Time: 162 msec     TR Vmax:        341.00 cm/s MR Peak grad: 73.6 mmHg MR Vmax:      429.00 cm/s   SHUNTS MV E velocity: 116.00 cm/s  Systemic VTI:  0.17 m MV A velocity: 78.30 cm/s   Systemic Diam: 2.10 cm MV E/A ratio:  1.48 Armanda Magic MD Electronically signed by Armanda Magic MD Signature Date/Time: 07/14/2022/12:43:41 PM    Final     I personally reviewed the cardiac catheterization images.  EF approximately 45% with severe three-vessel coronary disease.  Total occlusion of the RCA.  Targets appear fair quality.  Review of Systems  Constitutional:  Positive for activity change and fatigue.  HENT:  Negative for trouble swallowing and voice change.   Respiratory:  Positive for shortness of breath.   Cardiovascular:  Positive for chest pain.  Endocrine: Positive for polydipsia and polyuria.  Genitourinary:  Positive for frequency.  Neurological:   Negative for seizures and weakness.   Blood pressure 121/67, pulse 88, temperature 98.3 F (36.8 C),  temperature source Oral, resp. rate 16, height 5\' 8"  (1.727 m), weight 100.7 kg, SpO2 95 %. Physical Exam Vitals reviewed.  Constitutional:      General: She is not in acute distress.    Appearance: She is obese.  HENT:     Head: Normocephalic and atraumatic.  Eyes:     General: No scleral icterus.    Extraocular Movements: Extraocular movements intact.  Cardiovascular:     Rate and Rhythm: Normal rate and regular rhythm.     Heart sounds: Murmur (Faint systolic) heard.     No friction rub. No gallop.  Pulmonary:     Effort: Pulmonary effort is normal.     Breath sounds: No wheezing or rales.     Comments: Diminished breath sounds both bases Abdominal:     General: There is no distension.     Palpations: Abdomen is soft.     Tenderness: There is no abdominal tenderness.  Musculoskeletal:     Right lower leg: No edema.     Left lower leg: No edema.  Skin:    General: Skin is warm and dry.  Neurological:     General: No focal deficit present.     Mental Status: She is alert and oriented to person, place, and time.     Cranial Nerves: No cranial nerve deficit.     Motor: No weakness.     Assessment/Plan:  Alisha Williams is a 67 year old woman with a history of obesity, poorly controlled type 2 diabetes, hypothyroidism, anxiety, depression, and anemia.  She presented with a 1 week history of substernal chest pain with radiation to her throat accompanied by headache, nausea, and shortness of breath with exertion.  She ruled in for non-ST elevation MI and also was noted to be in heart failure and diabetic ketoacidosis.  Three-vessel coronary disease status post non-ST elevation MI-coronary bypass grafting is indicated for survival benefit and relief of symptoms.  She is relatively high risk due to her obesity, diffusely diseased coronaries, and poorly controlled diabetes.  I  discussed coronary artery bypass grafting with Ms. Frometa.  I informed her of the general nature of the procedure including the need for general anesthesia, the incisions to be used, the use of cardiopulmonary bypass, the use of drainage tubes and temporary pacemaker wires postoperatively, the expected hospital stay, and the overall recovery.  I informed her of the indications, risks, benefits, and alternatives.  She understands the risks include, but not limited to death, MI, stroke, DVT, PE, bleeding, possible need for transfusion, infection, cardiac arrhythmias, respiratory or renal failure, as well as the possibility of other unforeseeable complications.  She accepts the risks and wishes to proceed with surgery.  Type 2 diabetes-poorly controlled.  DKA on admission.  Issues with access to medications.  Hemoglobin A1c 11.2 on admission.  Needs optimization prior to surgery.  Hypothyroidism-TSH elevated.  On Synthroid.  Plan: Medical optimization with diuresis and control of diabetes Tentatively plan for coronary bypass grafting on Wednesday, 07/21/2022  Loreli Slot 07/15/2022, 10:42 AM

## 2022-07-15 NOTE — Progress Notes (Signed)
ANTICOAGULATION CONSULT NOTE - Initial Consult  Pharmacy Consult for heparin Indication: chest pain/ACS  Allergies  Allergen Reactions   Metformin And Related Other (See Comments)    Weakness & fatigue    Patient Measurements: Height: 5\' 8"  (172.7 cm) Weight: 100.7 kg (222 lb 0.1 oz) IBW/kg (Calculated) : 63.9 Heparin Dosing Weight: 86 kg  Vital Signs: Temp: 98.3 F (36.8 C) (06/20 0729) Temp Source: Oral (06/20 0729) BP: 121/67 (06/20 0729) Pulse Rate: 88 (06/20 0729)  Labs: Recent Labs    07/13/22 2244 07/13/22 2245 07/13/22 2245 07/13/22 2355 07/14/22 0000 07/14/22 0749 07/14/22 1227 07/14/22 1649 07/14/22 1802 07/15/22 0003 07/15/22 0818  HGB  --  12.2   < > 12.9  --   --   --   --   --  13.4  --   HCT  --  38.5  --  40.0  --   --   --   --   --  42.8  --   PLT  --  225  --  224  --   --   --   --   --  213  --   APTT 46*  --   --   --   --   --   --   --   --   --   --   LABPROT  --  15.1  --   --   --   --   --   --   --   --   --   INR  --  1.2  --   --   --   --   --   --   --   --   --   HEPARINUNFRC 0.24*  --   --   --   --  0.16*  --   --   --  <0.10* 0.21*  CREATININE  --  0.70   < >  --   --  0.55   < > 0.58 0.87 0.69  --   TROPONINIHS  --  3,215*  --   --  2,338*  --   --   --   --   --   --    < > = values in this interval not displayed.     Estimated Creatinine Clearance: 87 mL/min (by C-G formula based on SCr of 0.69 mg/dL).   Medical History: Past Medical History:  Diagnosis Date   Anemia    nos   Anxiety    Depression    Diabetes mellitus without complication (HCC)    Hypothyroidism     Assessment: 67 yo W transferred from [facility] ED for chest pain and troponin elevation. Heparin 4400 units given at noon and 960 units/hr ordered. Per RN, patient arrived with heparin running. No anticoagulation prior to admission. Pharmacy consulted for heparin.      Patient is s/p cath with severe 3 vessel disease. Plan to restart heparin 2  hrs after TR band off and consult for CABG. Per RN, TR band off around 5pm.  Heparin level is still subtherapeutic. We will increase rate and check another level.  Goal of Therapy:  Heparin level 0.3-0.7 units/ml Monitor platelets by anticoagulation protocol: Yes   Plan:  Increase heparin 1700 units/hr Check 6 hr HL    Monitor daily heparin level, CBC Monitor for signs/symptoms of bleeding   Ulyses Southward, PharmD, BCIDP, AAHIVP, CPP Infectious Disease Pharmacist 07/15/2022 9:44 AM

## 2022-07-15 NOTE — Progress Notes (Signed)
ANTICOAGULATION CONSULT NOTE - Follow Up Consult  Pharmacy Consult for heparin Indication:  CAD awaiting CVTS consult  Labs: Recent Labs    07/13/22 2244 07/13/22 2245 07/13/22 2245 07/13/22 2355 07/14/22 0000 07/14/22 0749 07/14/22 1227 07/14/22 1649 07/14/22 1802 07/15/22 0003  HGB  --  12.2   < > 12.9  --   --   --   --   --  13.4  HCT  --  38.5  --  40.0  --   --   --   --   --  42.8  PLT  --  225  --  224  --   --   --   --   --  213  APTT 46*  --   --   --   --   --   --   --   --   --   LABPROT  --  15.1  --   --   --   --   --   --   --   --   INR  --  1.2  --   --   --   --   --   --   --   --   HEPARINUNFRC 0.24*  --   --   --   --  0.16*  --   --   --  <0.10*  CREATININE  --  0.70   < >  --   --  0.55   < > 0.58 0.87 0.69  TROPONINIHS  --  3,215*  --   --  2,338*  --   --   --   --   --    < > = values in this interval not displayed.    Assessment: 66yo female subtherapeutic on heparin after resuming post-cath; no infusion issues or signs of bleeding per RN.  Goal of Therapy:  Heparin level 0.3-0.7 units/ml   Plan:  Increase heparin infusion by 2 units/kg/hr to 1500 units/hr. Check level in 6 hours.   Vernard Gambles, PharmD, BCPS 07/15/2022 1:36 AM

## 2022-07-16 DIAGNOSIS — I214 Non-ST elevation (NSTEMI) myocardial infarction: Secondary | ICD-10-CM | POA: Diagnosis not present

## 2022-07-16 LAB — CBC
HCT: 41.9 % (ref 36.0–46.0)
Hemoglobin: 13.1 g/dL (ref 12.0–15.0)
MCH: 26.6 pg (ref 26.0–34.0)
MCHC: 31.3 g/dL (ref 30.0–36.0)
MCV: 85.2 fL (ref 80.0–100.0)
Platelets: 230 10*3/uL (ref 150–400)
RBC: 4.92 MIL/uL (ref 3.87–5.11)
RDW: 13.4 % (ref 11.5–15.5)
WBC: 7.7 10*3/uL (ref 4.0–10.5)
nRBC: 0 % (ref 0.0–0.2)

## 2022-07-16 LAB — GLUCOSE, CAPILLARY
Glucose-Capillary: 276 mg/dL — ABNORMAL HIGH (ref 70–99)
Glucose-Capillary: 313 mg/dL — ABNORMAL HIGH (ref 70–99)
Glucose-Capillary: 280 mg/dL — ABNORMAL HIGH (ref 70–99)
Glucose-Capillary: 340 mg/dL — ABNORMAL HIGH (ref 70–99)

## 2022-07-16 LAB — BASIC METABOLIC PANEL
Anion gap: 13 (ref 5–15)
BUN: 23 mg/dL (ref 8–23)
CO2: 19 mmol/L — ABNORMAL LOW (ref 22–32)
Calcium: 8.7 mg/dL — ABNORMAL LOW (ref 8.9–10.3)
Chloride: 100 mmol/L (ref 98–111)
Creatinine, Ser: 0.63 mg/dL (ref 0.44–1.00)
GFR, Estimated: >60 mL/min (ref >60)
Glucose, Bld: 236 mg/dL — ABNORMAL HIGH (ref 70–99)
Potassium: 4.3 mmol/L (ref 3.5–5.1)
Sodium: 132 mmol/L — ABNORMAL LOW (ref 135–145)

## 2022-07-16 LAB — HEPARIN LEVEL (UNFRACTIONATED): Heparin Unfractionated: 0.34 IU/mL (ref 0.30–0.70)

## 2022-07-16 MED ORDER — INSULIN GLARGINE-YFGN 100 UNIT/ML ~~LOC~~ SOLN
24.0000 [IU] | Freq: Every day | SUBCUTANEOUS | Status: DC
  Administered 2022-07-16: 24 [IU] via SUBCUTANEOUS
  Filled 2022-07-16 (×2): qty 0.24

## 2022-07-16 MED ORDER — FUROSEMIDE 20 MG PO TABS
20.0000 mg | ORAL_TABLET | Freq: Every day | ORAL | Status: DC
  Administered 2022-07-16: 20 mg via ORAL
  Filled 2022-07-16: qty 1

## 2022-07-16 MED ORDER — INSULIN ASPART 100 UNIT/ML IJ SOLN
0.0000 [IU] | Freq: Every day | INTRAMUSCULAR | Status: DC
  Administered 2022-07-16: 4 [IU] via SUBCUTANEOUS

## 2022-07-16 NOTE — Care Management Important Message (Signed)
Important Message  Patient Details  Name: Alisha Williams MRN: [id number] Date of Birth: 01-10-1956   Medicare Important Message Given:  Yes     Dionne Rossa Stefan Church 07/16/2022, 3:05 PM

## 2022-07-16 NOTE — Plan of Care (Addendum)
Nutrition Education Note  RD consulted for nutrition education regarding diabetes.  RD working remotely. Unable to reach pt via phone call to room.  Lab Results  Component Value Date   HGBA1C 11.2 (H) 07/13/2022    RD has attached "Plate Method for Diabetes" handout from the Academy of Nutrition and Dietetics. Discussed different food groups and their effects on blood sugar, emphasizing carbohydrate-containing foods. Provided list of carbohydrates and recommended serving sizes of common foods.  Discussed importance of controlled and consistent carbohydrate intake throughout the day. Provided examples of ways to balance meals/snacks and encouraged intake of high-fiber, whole grain complex carbohydrates.  Current diet order is Carb Modified, patient is consuming approximately 100% of meals at this time. Labs and medications reviewed. No further nutrition interventions warranted at this time. RD contact information provided. If additional nutrition issues arise, please re-consult RD.   Mertie Clause, MS, RD, LDN Inpatient Clinical Dietitian Please see AMiON for contact information.

## 2022-07-16 NOTE — Discharge Instructions (Addendum)
Discharge Instructions:  1. You may shower, please wash incisions daily with soap and water and keep dry.  If you wish to cover wounds with dressing you may do so but please keep clean and change daily.  No tub baths or swimming until incisions have completely healed.  If your incisions become red or develop any drainage please call our office at [phone]  2. No Driving until cleared by Dr. Sunday Corn office and you are no longer using narcotic pain medications  3. Monitor your weight daily.. Please use the same scale and weigh at same time... If you gain 5-10 lbs in 48 hours with associated lower extremity swelling, please contact our office at [phone]  4. Fever of 101.5 for at least 24 hours with no source, please contact our office at [phone]  5. Activity- up as tolerated, please walk at least 3 times per day.  Avoid strenuous activity, no lifting, pushing, or pulling with your arms over 8-10 lbs for a minimum of 6 weeks  6. If any questions or concerns arise, please do not hesitate to contact our office at [phone]   Plate Method for Diabetes   Foods with carbohydrates make your blood glucose level go up. The plate method is a simple way to meal plan and control the amount of carbohydrate you eat.         Use the following guidance to build a healthy plate to control carbohydrates. Divide a 9-inch plate into 3 sections, and consider your beverage the 4th section of your meal: Food Group Examples of Foods/Beverages for This Section of your Meal  Section 1: Non-starchy vegetables Fill  of your plate to include non-starchy vegetables Asparagus, broccoli, brussels sprouts, cabbage, carrots, cauliflower, celery, cucumber, green beans, mushrooms, peppers, salad greens, tomatoes, or zucchini.  Section 2: Protein foods Fill  of your plate to include a lean protein Lean meat, poultry, fish, seafood, cheese, eggs, lean deli meat, tofu, beans, lentils, nuts or nut butters.   Section 3: Carbohydrate foods Fill  of your plate to include carbohydrate foods Whole grains, whole wheat bread, brown rice, whole grain pasta, polenta, corn tortillas, fruit, or starchy vegetables (potatoes, green peas, corn, beans, acorn squash, and butternut squash). One cup of milk also counts as a food that contains carbohydrate.  Section 4: Beverage Choose water or a low-calorie drink for your beverage. Unsweetened tea, coffee, or flavored/sparkling water without added sugar.  Image reprinted with permission from The American Diabetes Association.  Copyright 2022 by the American Diabetes Association.   Copyright 2022  Academy of Nutrition and Dietetics. All rights reserved    Semglee copay assistance https://www.semglee.com/#savings-and-support

## 2022-07-16 NOTE — Inpatient Diabetes Management (Signed)
Inpatient Diabetes Program Recommendations  AACE/ADA: New Consensus Statement on Inpatient Glycemic Control (2015)  Target Ranges:  Prepandial:   less than 140 mg/dL      Peak postprandial:   less than 180 mg/dL (1-2 hours)      Critically ill patients:  140 - 180 mg/dL   Lab Results  Component Value Date   GLUCAP 276 (H) 07/16/2022   HGBA1C 11.2 (H) 07/13/2022    Review of Glycemic Control  Latest Reference Range & Units 07/15/22 06:18 07/15/22 11:06 07/15/22 16:18 07/15/22 21:11 07/16/22 06:04  Glucose-Capillary 70 - 99 mg/dL 161 (H) 096 (H) 045 (H) 292 (H) 276 (H)  (H): Data is abnormally high Diabetes history: Type 2 DM Outpatient Diabetes medications: Metformin 1500 mg every day, Victoza 1.2 mg every day, Farxiga 10 mg QD Current orders for Inpatient glycemic control: Semglee 16 units at bedtime, Novolog 0-9 units TID   Inpatient Diabetes Program Recommendations:     Consider: -Further increasing Semglee to 24 units at bedtime -Adding Novolog 0-5 units QHS  Thanks, Lujean Rave, MSN, RNC-OB Diabetes Coordinator [phone] (8a-5p)

## 2022-07-16 NOTE — Progress Notes (Addendum)
When giving patient her morning medications, patient agreed to taking all medications.    Patient's husband then adamantly refused for patient to take her Lipitor because it is a statin. He stated "I had terrible side effects and ended up in a wheelchair and almost died because of them so we don't take them. I want it noted on her chart that we do not want any statins given to her." Patient then stated "I'll hold off on that one."   Later, while patient's husband went to car to grab belongings, patient stated "I think I should go ahead and take the Lipitor while he's not here if you can hurry and grab it."  RN then administered Lipitor.

## 2022-07-16 NOTE — Progress Notes (Signed)
Rounding Note    Patient Name: Alisha Williams Date of Encounter: 07/16/2022  [facility] Cardiologist: Dr Jens Som  Subjective   Pt denies CP or dyspnea  Inpatient Medications    Scheduled Meds:  aspirin EC  81 mg Oral Daily   atorvastatin  80 mg Oral Daily   ezetimibe  10 mg Oral Daily   insulin aspart  0-9 Units Subcutaneous TID WC   insulin glargine-yfgn  16 Units Subcutaneous q1800   levothyroxine  125 mcg Oral Daily   metoprolol tartrate  12.5 mg Oral BID   sodium chloride flush  3 mL Intravenous Q12H   Continuous Infusions:  sodium chloride     heparin 1,700 Units/hr (07/15/22 2055)   PRN Meds: sodium chloride, acetaminophen, acetaminophen, dextrose, nitroGLYCERIN, ondansetron (ZOFRAN) IV, ondansetron (ZOFRAN) IV, sodium chloride flush   Vital Signs    Vitals:   07/15/22 1931 07/15/22 2334 07/16/22 0411 07/16/22 0500  BP: 116/87 112/75 103/66   Pulse: 99 78 82   Resp:   19   Temp: 98.4 F (36.9 C) 97.8 F (36.6 C) 97.8 F (36.6 C)   TempSrc: Oral Oral Oral   SpO2:   95%   Weight:    98.7 kg  Height:        Intake/Output Summary (Last 24 hours) at 07/16/2022 0651 Last data filed at 07/16/2022 1308 Gross per 24 hour  Intake 634.16 ml  Output 2650 ml  Net -2015.84 ml       07/16/2022    5:00 AM 07/15/2022    3:50 AM 07/14/2022    5:00 AM  Last 3 Weights  Weight (lbs) 217 lb 9.5 oz 222 lb 0.1 oz 223 lb 9.6 oz  Weight (kg) 98.7 kg 100.7 kg 101.424 kg      Telemetry    Sinus - Personally Reviewed   Physical Exam   GEN: NAD Neck: No JVD, supple Cardiac: RRR Respiratory: CTA GI: Soft, NT/ND MS: No edema; radial cath site with no hematoma Neuro: Grossly intact Psych: Normal affect   Labs    High Sensitivity Troponin:   Recent Labs  Lab 07/13/22 2245 07/14/22 0000  TROPONINIHS 3,215* 2,338*      Chemistry Recent Labs  Lab 07/13/22 2245 07/14/22 0749 07/14/22 1227 07/14/22 1802 07/15/22 0003 07/16/22 0020  NA 136  133*   < > 129* 134* 132*  K 3.7 3.7   < > 4.5 3.7 4.3  CL 98 101   < > 99 100 100  CO2 19* 22   < > 15* 20* 19*  GLUCOSE 206* 146*   < > 242* 154* 236*  BUN 13 13   < > 10 14 23   CREATININE 0.70 0.55   < > 0.87 0.69 0.63  CALCIUM 8.6* 8.1*   < > 8.0* 8.4* 8.7*  MG  --  2.1  --   --   --   --   PROT 6.7  --   --   --   --   --   ALBUMIN 3.5  --   --   --   --   --   AST 31  --   --   --   --   --   ALT 21  --   --   --   --   --   ALKPHOS 47  --   --   --   --   --   BILITOT 1.7*  --   --   --   --   --  GFRNONAA >60 >60   < > >60 >60 >60  ANIONGAP 19* 10   < > 15 14 13    < > = values in this interval not displayed.     Lipids  Recent Labs  Lab 07/14/22 0749  CHOL 223*  TRIG 442*  HDL 35*  LDLCALC UNABLE TO CALCULATE IF TRIGLYCERIDE OVER 400 mg/dL  CHOLHDL 6.4     Hematology Recent Labs  Lab 07/13/22 2355 07/15/22 0003 07/16/22 0020  WBC 8.0 7.1 7.7  RBC 4.71 5.09 4.92  HGB 12.9 13.4 13.1  HCT 40.0 42.8 41.9  MCV 84.9 84.1 85.2  MCH 27.4 26.3 26.6  MCHC 32.3 31.3 31.3  RDW 13.7 13.6 13.4  PLT 224 213 230    Thyroid  Recent Labs  Lab 07/14/22 1227  TSH 5.031*     BNP Recent Labs  Lab 07/14/22 0743  BNP 217.7*      Radiology    CARDIAC CATHETERIZATION  Result Date: 07/14/2022   Prox LAD to Mid LAD lesion is 80% stenosed.   Mid RCA lesion is 99% stenosed.   Prox Cx to Mid Cx lesion is 90% stenosed.   1st Mrg lesion is 90% stenosed.   There is mild left ventricular systolic dysfunction.   LV end diastolic pressure is moderately elevated.   The left ventricular ejection fraction is 45-50% by visual estimate.   There is no aortic valve stenosis.   Anticipated discharge date to be determined. Severe three vessel CAD.  Culprit lesion is the heavily calcified subtotally occluded mid RCA.  Volume overload which is likely contributing to her oxygen requirement.  IV Lasix ordered post catheterization.  Cardiac surgery consultation.  She will need to be  optimized medically prior to surgery. Results discussed with husband, Nawal Burling [phone]   ECHOCARDIOGRAM COMPLETE  Result Date: 07/14/2022    ECHOCARDIOGRAM REPORT   Patient Name:   Alisha Williams Date of Exam: 07/14/2022 Medical Rec #:  [id number]   Height:       68.0 in Accession #:    [id number]  Weight:       223.6 lb Date of Birth:  [birth year]  BSA:          2.143 m Patient Age:    66 years    BP:           100/59 mmHg Patient Gender: F           HR:           89 bpm. Exam Location:  Inpatient Procedure: 2D Echo, Cardiac Doppler and Color Doppler Indications:    NSTEMI  History:        Patient has no prior history of Echocardiogram examinations.                 Previous Myocardial Infarction; Risk Factors:Diabetes.  Sonographer:    Lucendia Herrlich Referring Phys: [id number] BELAL A SULEIMAN IMPRESSIONS  1. Left ventricular ejection fraction, by estimation, is 50 to 55%. The left ventricle has low normal function. The left ventricle demonstrates regional wall motion abnormalities (see scoring diagram/findings for description). There is mild left ventricular hypertrophy of the basal-septal segment. Left ventricular diastolic parameters are indeterminate. Elevated left ventricular end-diastolic pressure. There is severe hypokinesis of the left ventricular, basal inferior wall. There is severe hypokinesis of the left ventricular, apical inferior wall. There is severe hypokinesis of the left ventricular, entire inferolateral wall.  2. Right ventricular systolic function is normal. The right ventricular size is  normal. There is moderately elevated pulmonary artery systolic pressure. The estimated right ventricular systolic pressure is 54.5 mmHg.  3. The mitral valve is normal in structure. Mild mitral valve regurgitation. No evidence of mitral stenosis.  4. The aortic valve is tricuspid. Aortic valve regurgitation is not visualized. Aortic valve sclerosis/calcification is present, without any evidence of aortic  stenosis. Aortic valve Vmax measures 1.22 m/s.  5. The inferior vena cava is dilated in size with >50% respiratory variability, suggesting right atrial pressure of 8 mmHg. FINDINGS  Left Ventricle: Left ventricular ejection fraction, by estimation, is 50 to 55%. The left ventricle has low normal function. The left ventricle demonstrates regional wall motion abnormalities. Severe hypokinesis of the left ventricular, basal inferior wall. Severe hypokinesis of the left ventricular, apical inferior wall. Severe hypokinesis of the left ventricular, entire inferolateral wall. The left ventricular internal cavity size was normal in size. There is mild left ventricular hypertrophy of the  basal-septal segment. Left ventricular diastolic parameters are indeterminate. Elevated left ventricular end-diastolic pressure. Right Ventricle: The right ventricular size is normal. No increase in right ventricular wall thickness. Right ventricular systolic function is normal. There is moderately elevated pulmonary artery systolic pressure. The tricuspid regurgitant velocity is 3.41 m/s, and with an assumed right atrial pressure of 8 mmHg, the estimated right ventricular systolic pressure is 54.5 mmHg. Left Atrium: Left atrial size was normal in size. Right Atrium: Right atrial size was normal in size. Pericardium: There is no evidence of pericardial effusion. Mitral Valve: The mitral valve is normal in structure. Mild mitral annular calcification. Mild mitral valve regurgitation. No evidence of mitral valve stenosis. Tricuspid Valve: The tricuspid valve is normal in structure. Tricuspid valve regurgitation is trivial. No evidence of tricuspid stenosis. Aortic Valve: The aortic valve is tricuspid. Aortic valve regurgitation is not visualized. Aortic valve sclerosis/calcification is present, without any evidence of aortic stenosis. Aortic valve peak gradient measures 6.0 mmHg. Pulmonic Valve: The pulmonic valve was normal in structure.  Pulmonic valve regurgitation is trivial. No evidence of pulmonic stenosis. Aorta: The aortic root is normal in size and structure. Venous: The inferior vena cava is dilated in size with greater than 50% respiratory variability, suggesting right atrial pressure of 8 mmHg. IAS/Shunts: No atrial level shunt detected by color flow Doppler.  LEFT VENTRICLE PLAX 2D LVIDd:         5.40 cm   Diastology LVIDs:         4.00 cm   LV e' medial:    5.64 cm/s LV PW:         1.10 cm   LV E/e' medial:  20.6 LV IVS:        1.20 cm   LV e' lateral:   7.80 cm/s LVOT diam:     2.10 cm   LV E/e' lateral: 14.9 LV SV:         59 LV SV Index:   27 LVOT Area:     3.46 cm  RIGHT VENTRICLE             IVC RV S prime:     10.60 cm/s  IVC diam: 2.60 cm TAPSE (M-mode): 1.8 cm LEFT ATRIUM           Index        RIGHT ATRIUM           Index LA diam:      4.50 cm 2.10 cm/m   RA Area:     10.60 cm LA Vol (  A2C): 51.1 ml 23.85 ml/m  RA Volume:   23.20 ml  10.83 ml/m LA Vol (A4C): 55.4 ml 25.85 ml/m  AORTIC VALVE AV Area (Vmax): 2.66 cm AV Vmax:        122.00 cm/s AV Peak Grad:   6.0 mmHg LVOT Vmax:      93.60 cm/s LVOT Vmean:     63.000 cm/s LVOT VTI:       0.170 m  AORTA Ao Root diam: 2.60 cm Ao Asc diam:  3.70 cm MITRAL VALVE                TRICUSPID VALVE MV Area (PHT): 4.68 cm     TR Peak grad:   46.5 mmHg MV Decel Time: 162 msec     TR Vmax:        341.00 cm/s MR Peak grad: 73.6 mmHg MR Vmax:      429.00 cm/s   SHUNTS MV E velocity: 116.00 cm/s  Systemic VTI:  0.17 m MV A velocity: 78.30 cm/s   Systemic Diam: 2.10 cm MV E/A ratio:  1.48 Armanda Magic MD Electronically signed by Armanda Magic MD Signature Date/Time: 07/14/2022/12:43:41 PM    Final       Patient Profile     67 y.o. female with past medical history of diabetes mellitus, hypothyroidism admitted with non-ST elevation myocardial infarction.  Cardiac catheterization revealed severe three-vessel coronary artery disease, ejection fraction 45 to 50% and elevated left ventricular  end-diastolic pressure.  Echocardiogram shows ejection fraction 50 to 55%, mild left ventricular hypertrophy, hypokinesis of the basal inferior wall and inferolateral wall, mild mitral regurgitation.  Assessment & Plan    1 non-ST elevation myocardial infarction-patient remains pain-free.  Continue aspirin, heparin, statin and metoprolol.  Cardiac catheterization reveals severe three-vessel coronary artery disease and echocardiogram shows preserved LV function. Seen by Dr Dorris Fetch and CABG tentatively scheduled for Wednesday.  During my time with the patient this morning the patient's husband was on speaker phone.  Patient's husband was extremely difficult, irate and irrational.  He stated that no one would touch his wife on Wednesday of next week because their anniversary was Thursday.  He stated "no surgery will be performed until Friday".  He also stated he wanted stents tried first.  I explained that the patient had diabetes mellitus and severe three-vessel coronary artery disease and that coronary artery bypass and graft is typically the best option.  This was also Dr. Hoyle Barr opinion and I explained that he is our expert on coronary disease.  Pts husband also stated that he wanted a female Careers adviser.  "My wife's chest will be bear and I do not feel comfortable with a female operating on her".  I explained that there is no female cardiothoracic surgeon in [place].  He states then we will need to transfer her.  He then stated that I should leave his wife's room immediately and that I was " terminated".  Patient's wife stated that "her husband is stressed".  I stated I would be happy to help him in any way possible.  2 acute diastolic congestive heart failure-left ventricular end-diastolic pressure elevated at time of cardiac catheterization.  She is symptomatically improved.  Will continue Lasix 20 mg p.o. daily and follow renal function.  3 diabetes mellitus-continue present regimen.  Follow  CBGs.  For questions or updates, please contact [facility] Please consult [email or web address] for contact info under        Signed, Olga Millers, MD  07/16/2022, 6:51  AM

## 2022-07-17 ENCOUNTER — Inpatient Hospital Stay (HOSPITAL_COMMUNITY): Payer: 59

## 2022-07-17 DIAGNOSIS — E782 Mixed hyperlipidemia: Secondary | ICD-10-CM

## 2022-07-17 DIAGNOSIS — Z01810 Encounter for preprocedural cardiovascular examination: Secondary | ICD-10-CM | POA: Diagnosis not present

## 2022-07-17 DIAGNOSIS — E7841 Elevated Lipoprotein(a): Secondary | ICD-10-CM

## 2022-07-17 DIAGNOSIS — I214 Non-ST elevation (NSTEMI) myocardial infarction: Secondary | ICD-10-CM | POA: Diagnosis not present

## 2022-07-17 DIAGNOSIS — I2510 Atherosclerotic heart disease of native coronary artery without angina pectoris: Secondary | ICD-10-CM | POA: Diagnosis not present

## 2022-07-17 LAB — CBC
HCT: 40 % (ref 36.0–46.0)
Hemoglobin: 13.1 g/dL (ref 12.0–15.0)
MCH: 27.1 pg (ref 26.0–34.0)
MCHC: 32.8 g/dL (ref 30.0–36.0)
MCV: 82.8 fL (ref 80.0–100.0)
Platelets: 274 10*3/uL (ref 150–400)
RBC: 4.83 MIL/uL (ref 3.87–5.11)
RDW: 13.2 % (ref 11.5–15.5)
WBC: 8 10*3/uL (ref 4.0–10.5)
nRBC: 0 % (ref 0.0–0.2)

## 2022-07-17 LAB — BASIC METABOLIC PANEL
Anion gap: 16 — ABNORMAL HIGH (ref 5–15)
BUN: 24 mg/dL — ABNORMAL HIGH (ref 8–23)
CO2: 16 mmol/L — ABNORMAL LOW (ref 22–32)
Calcium: 8.8 mg/dL — ABNORMAL LOW (ref 8.9–10.3)
Chloride: 98 mmol/L (ref 98–111)
Creatinine, Ser: 0.62 mg/dL (ref 0.44–1.00)
GFR, Estimated: >60 mL/min (ref >60)
Glucose, Bld: 289 mg/dL — ABNORMAL HIGH (ref 70–99)
Potassium: 3.6 mmol/L (ref 3.5–5.1)
Sodium: 130 mmol/L — ABNORMAL LOW (ref 135–145)

## 2022-07-17 LAB — GLUCOSE, CAPILLARY
Glucose-Capillary: 304 mg/dL — ABNORMAL HIGH (ref 70–99)
Glucose-Capillary: 323 mg/dL — ABNORMAL HIGH (ref 70–99)
Glucose-Capillary: 326 mg/dL — ABNORMAL HIGH (ref 70–99)

## 2022-07-17 LAB — HEPARIN LEVEL (UNFRACTIONATED): Heparin Unfractionated: 0.5 IU/mL (ref 0.30–0.70)

## 2022-07-17 MED ORDER — INSULIN ASPART 100 UNIT/ML IJ SOLN
3.0000 [IU] | Freq: Three times a day (TID) | INTRAMUSCULAR | Status: DC
  Administered 2022-07-17 (×2): 5 [IU] via SUBCUTANEOUS
  Administered 2022-07-18 – 2022-07-19 (×4): 3 [IU] via SUBCUTANEOUS

## 2022-07-17 MED ORDER — INSULIN GLARGINE-YFGN 100 UNIT/ML ~~LOC~~ SOLN
28.0000 [IU] | Freq: Every day | SUBCUTANEOUS | Status: DC
  Administered 2022-07-17 – 2022-07-18 (×2): 28 [IU] via SUBCUTANEOUS
  Filled 2022-07-17 (×3): qty 0.28

## 2022-07-17 NOTE — Inpatient Diabetes Management (Signed)
Inpatient Diabetes Program Recommendations  AACE/ADA: New Consensus Statement on Inpatient Glycemic Control   Target Ranges:  Prepandial:   less than 140 mg/dL      Peak postprandial:   less than 180 mg/dL (1-2 hours)      Critically ill patients:  140 - 180 mg/dL    Latest Reference Range & Units 07/16/22 06:04 07/16/22 11:16 07/16/22 15:53 07/16/22 21:29 07/17/22 06:49  Glucose-Capillary 70 - 99 mg/dL 409 (H) 811 (H) 914 (H) 340 (H) 304 (H)   Review of Glycemic Control  Diabetes history: DM2 Outpatient Diabetes medications: Farxiga 10 mg daily, Victoza 1.2 mg daily, Metformin 1500 mg daily Current orders for Inpatient glycemic control: Semglee 24 units QPM, Novolog 0-9 units TID with meals, Novolog 0-5 units QHS  Inpatient Diabetes Program Recommendations:    Insulin: CBGs ranged from 276-340 mg/dl on 7/82/95 and 621 mg/dl this morning.  Please consider increasing Semglee to 28 units QPM and adding Novolog 5 units TID with meals for meal coverage if patient eats at least 50% of meals.  Thanks, Orlando Penner, RN, MSN, CDCES Diabetes Coordinator Inpatient Diabetes Program [phone] (Team Pager from 8am to 5pm)

## 2022-07-17 NOTE — Progress Notes (Signed)
Pre-CABG vascular studies have been completed.   Results can be found under chart review under CV PROC. 07/17/2022 4:59 PM Pratik Dalziel RVT, RDMS

## 2022-07-17 NOTE — Progress Notes (Signed)
Rounding Note    Patient Name: Alisha Williams Date of Encounter: 07/17/2022  [facility] Cardiologist: Dr Jens Som  Subjective   Pt denies CP or dyspnea  Inpatient Medications    Scheduled Meds:  aspirin EC  81 mg Oral Daily   atorvastatin  80 mg Oral Daily   ezetimibe  10 mg Oral Daily   furosemide  20 mg Oral Daily   insulin aspart  0-5 Units Subcutaneous QHS   insulin aspart  0-9 Units Subcutaneous TID WC   insulin glargine-yfgn  24 Units Subcutaneous q1800   levothyroxine  125 mcg Oral Daily   metoprolol tartrate  12.5 mg Oral BID   sodium chloride flush  3 mL Intravenous Q12H   Continuous Infusions:  sodium chloride     heparin 1,700 Units/hr (07/17/22 0600)   PRN Meds: sodium chloride, acetaminophen, acetaminophen, dextrose, nitroGLYCERIN, ondansetron (ZOFRAN) IV, ondansetron (ZOFRAN) IV, sodium chloride flush   Vital Signs    Vitals:   07/16/22 2309 07/17/22 0409 07/17/22 0600 07/17/22 0658  BP: 99/66 (!) 104/59 118/68   Pulse: 82 75 74   Resp: 20 18 17  (!) 24  Temp: 97.6 F (36.4 C) 97.6 F (36.4 C)    TempSrc: Oral Oral    SpO2: 98% 97% 98%   Weight:    98.9 kg  Height:        Intake/Output Summary (Last 24 hours) at 07/17/2022 0918 Last data filed at 07/17/2022 0600 Gross per 24 hour  Intake 881.06 ml  Output 1650 ml  Net -768.94 ml      07/17/2022    6:58 AM 07/16/2022    5:00 AM 07/15/2022    3:50 AM  Last 3 Weights  Weight (lbs) 218 lb 0.6 oz 217 lb 9.5 oz 222 lb 0.1 oz  Weight (kg) 98.9 kg 98.7 kg 100.7 kg      Telemetry    Sinus - Personally Reviewed   Physical Exam   GEN: NAD Neck: No JVD, supple Cardiac: RRR Respiratory: CTA GI: Soft, NT/ND MS: No edema; radial cath site with no hematoma Neuro: Grossly intact Psych: Normal affect   Labs    High Sensitivity Troponin:   Recent Labs  Lab 07/13/22 2245 07/14/22 0000  TROPONINIHS 3,215* 2,338*     Chemistry Recent Labs  Lab 07/13/22 2245 07/14/22 0749  07/14/22 1227 07/15/22 0003 07/16/22 0020 07/17/22 0039  NA 136 133*   < > 134* 132* 130*  K 3.7 3.7   < > 3.7 4.3 3.6  CL 98 101   < > 100 100 98  CO2 19* 22   < > 20* 19* 16*  GLUCOSE 206* 146*   < > 154* 236* 289*  BUN 13 13   < > 14 23 24*  CREATININE 0.70 0.55   < > 0.69 0.63 0.62  CALCIUM 8.6* 8.1*   < > 8.4* 8.7* 8.8*  MG  --  2.1  --   --   --   --   PROT 6.7  --   --   --   --   --   ALBUMIN 3.5  --   --   --   --   --   AST 31  --   --   --   --   --   ALT 21  --   --   --   --   --   ALKPHOS 47  --   --   --   --   --  BILITOT 1.7*  --   --   --   --   --   GFRNONAA >60 >60   < > >60 >60 >60  ANIONGAP 19* 10   < > 14 13 16*   < > = values in this interval not displayed.    Lipids  Recent Labs  Lab 07/14/22 0749  CHOL 223*  TRIG 442*  HDL 35*  LDLCALC UNABLE TO CALCULATE IF TRIGLYCERIDE OVER 400 mg/dL  CHOLHDL 6.4    Hematology Recent Labs  Lab 07/15/22 0003 07/16/22 0020 07/17/22 0039  WBC 7.1 7.7 8.0  RBC 5.09 4.92 4.83  HGB 13.4 13.1 13.1  HCT 42.8 41.9 40.0  MCV 84.1 85.2 82.8  MCH 26.3 26.6 27.1  MCHC 31.3 31.3 32.8  RDW 13.6 13.4 13.2  PLT 213 230 274   Thyroid  Recent Labs  Lab 07/14/22 1227  TSH 5.031*    BNP Recent Labs  Lab 07/14/22 0743  BNP 217.7*     Radiology    No results found.    Patient Profile     67 y.o. female with past medical history of diabetes mellitus, hypothyroidism admitted with non-ST elevation myocardial infarction.  Cardiac catheterization revealed severe three-vessel coronary artery disease, ejection fraction 45 to 50% and elevated left ventricular end-diastolic pressure.  Echocardiogram shows ejection fraction 50 to 55%, mild left ventricular hypertrophy, hypokinesis of the basal inferior wall and inferolateral wall, mild mitral regurgitation.  Assessment & Plan    1 non-ST elevation myocardial infarction-patient remains pain-free.  Continue aspirin, heparin, statin and metoprolol.  Cardiac  catheterization reveals severe three-vessel coronary artery disease and echocardiogram shows preserved LV function. Seen by Dr Dorris Fetch and CABG tentatively scheduled for Wednesday.  2 acute diastolic congestive heart failure-left ventricular end-diastolic pressure elevated at time of cardiac catheterization.  She is symptomatically improved and appears euvolemic. Sodium is trending down, but creatinine stable. Will stop Lasix today- monitor for s/s of recurrent CHF.  3 diabetes mellitus-continue present regimen.  Follow CBGs.  4. Dyslipidemia - noted to have high LP(a) of 238.2 nmol/L. TC 223, TG 442, HDL 35, dLDL 124.  On atorva 80 and ezetimibe. Good candidate to add PCSK9i at follow-up - with target LDL <55 if achievable.  They would like to follow-up in [place] if possible.  For questions or updates, please contact [facility] Please consult [email or web address] for contact info under   Chrystie Nose, MD, Milagros Loll  [place]  [facility]  Medical Director of the [facility]  Cardiovascular Risk Reduction [facility] Diplomate of the American Board of Clinical Lipidology Attending Cardiologist  Direct Dial: [phone]  Fax: [phone]  Website:  [email or web address]  Chrystie Nose, MD  07/17/2022, 9:18 AM

## 2022-07-17 NOTE — Progress Notes (Signed)
ANTICOAGULATION CONSULT NOTE   Pharmacy Consult for heparin Indication: chest pain/ACS  Allergies  Allergen Reactions   Metformin And Related Other (See Comments)    Weakness & fatigue    Patient Measurements: Height: 5\' 8"  (172.7 cm) Weight: 98.9 kg (218 lb 0.6 oz) IBW/kg (Calculated) : 63.9 Heparin Dosing Weight: 86 kg  Vital Signs: Temp: 97.6 F (36.4 C) (06/22 0409) Temp Source: Oral (06/22 0409) BP: 118/68 (06/22 0600) Pulse Rate: 74 (06/22 0600)  Labs: Recent Labs    07/15/22 0003 07/15/22 0818 07/15/22 1619 07/16/22 0020 07/17/22 0039  HGB 13.4  --   --  13.1 13.1  HCT 42.8  --   --  41.9 40.0  PLT 213  --   --  230 274  HEPARINUNFRC <0.10*   < > 0.41 0.34 0.50  CREATININE 0.69  --   --  0.63 0.62   < > = values in this interval not displayed.     Estimated Creatinine Clearance: 86.2 mL/min (by C-G formula based on SCr of 0.62 mg/dL).   Medical History: Past Medical History:  Diagnosis Date   Anemia    nos   Anxiety    Depression    Diabetes mellitus without complication (HCC)    Hypothyroidism     Assessment: 67 yo W transferred from [facility] ED for chest pain and troponin elevation. Heparin 4400 units given at noon and 960 units/hr ordered. Per RN, patient arrived with heparin running. Patient is s/p cath with severe 3 vessel disease. No anticoagulation prior to admission. Pharmacy consulted for heparin.     Heparin level 0.5 is therapeutic on 1700 units/hr.  Tentative CABG possibly on 6/26. CBC stable. No overt bleeding noted.  Goal of Therapy:  Heparin level 0.3-0.7 units/ml Monitor platelets by anticoagulation protocol: Yes   Plan:  Continue heparin 1700 units/hr Monitor daily heparin level, CBC Monitor for signs/symptoms of bleeding   Thank you for involving pharmacy in this patient's care.  Enos Fling, PharmD PGY2 Pharmacy Resident 07/17/2022 8:29 AM

## 2022-07-17 NOTE — Progress Notes (Signed)
Mobility Specialist: Progress Note   07/17/22 1156  Mobility  Activity Ambulated with assistance in hallway  Level of Assistance Contact guard assist, steadying assist  Assistive Device None  Distance Ambulated (ft) 230 ft  Activity Response Tolerated well  Mobility Referral Yes  $Mobility charge 1 Mobility  Mobility Specialist Start Time (ACUTE ONLY) 1140  Mobility Specialist Stop Time (ACUTE ONLY) 1154  Mobility Specialist Time Calculation (min) (ACUTE ONLY) 14 min   Pre-Mobility: 80 HR, 94% SpO2 Post-Mobility: 84 HR, 97% SpO2  Received pt in chair having no complaints and agreeable to mobility. Pt was asymptomatic throughout ambulation and returned to room w/o fault. Left in chair w/ call bell in reach and all needs met.  Emmalena Canny Mobility Specialist Please contact via SecureChat or Rehab office at [phone]

## 2022-07-18 ENCOUNTER — Encounter (HOSPITAL_COMMUNITY): Payer: Self-pay | Admitting: Internal Medicine

## 2022-07-18 DIAGNOSIS — I214 Non-ST elevation (NSTEMI) myocardial infarction: Secondary | ICD-10-CM | POA: Diagnosis not present

## 2022-07-18 LAB — URINALYSIS, ROUTINE W REFLEX MICROSCOPIC
Bilirubin Urine: NEGATIVE
Glucose, UA: >=500 mg/dL — AB
Ketones, ur: NEGATIVE mg/dL
Nitrite: NEGATIVE
Protein, ur: NEGATIVE mg/dL
RBC / HPF: >50 RBC/hpf (ref 0–5)
Specific Gravity, Urine: 1.029 (ref 1.005–1.030)
pH: 6 (ref 5.0–8.0)

## 2022-07-18 LAB — CBC
HCT: 40.6 % (ref 36.0–46.0)
Hemoglobin: 12.9 g/dL (ref 12.0–15.0)
MCH: 26.5 pg (ref 26.0–34.0)
MCHC: 31.8 g/dL (ref 30.0–36.0)
MCV: 83.4 fL (ref 80.0–100.0)
Platelets: 238 10*3/uL (ref 150–400)
RBC: 4.87 MIL/uL (ref 3.87–5.11)
RDW: 13.4 % (ref 11.5–15.5)
WBC: 6.6 10*3/uL (ref 4.0–10.5)
nRBC: 0 % (ref 0.0–0.2)

## 2022-07-18 LAB — GLUCOSE, CAPILLARY
Glucose-Capillary: 302 mg/dL — ABNORMAL HIGH (ref 70–99)
Glucose-Capillary: 327 mg/dL — ABNORMAL HIGH (ref 70–99)
Glucose-Capillary: 254 mg/dL — ABNORMAL HIGH (ref 70–99)
Glucose-Capillary: 200 mg/dL — ABNORMAL HIGH (ref 70–99)

## 2022-07-18 LAB — HEPARIN LEVEL (UNFRACTIONATED): Heparin Unfractionated: 0.5 IU/mL (ref 0.30–0.70)

## 2022-07-18 MED ORDER — INSULIN ASPART 100 UNIT/ML IJ SOLN
0.0000 [IU] | Freq: Every day | INTRAMUSCULAR | Status: DC
  Administered 2022-07-19: 2 [IU] via SUBCUTANEOUS

## 2022-07-18 MED ORDER — INSULIN ASPART 100 UNIT/ML IJ SOLN
0.0000 [IU] | Freq: Three times a day (TID) | INTRAMUSCULAR | Status: DC
  Administered 2022-07-18: 11 [IU] via SUBCUTANEOUS
  Administered 2022-07-18 – 2022-07-19 (×2): 8 [IU] via SUBCUTANEOUS
  Administered 2022-07-19: 5 [IU] via SUBCUTANEOUS
  Administered 2022-07-19: 8 [IU] via SUBCUTANEOUS
  Administered 2022-07-20 (×2): 5 [IU] via SUBCUTANEOUS
  Administered 2022-07-20 – 2022-07-21 (×2): 3 [IU] via SUBCUTANEOUS

## 2022-07-18 NOTE — Inpatient Diabetes Management (Signed)
Inpatient Diabetes Program Recommendations  AACE/ADA: New Consensus Statement on Inpatient Glycemic Control   Target Ranges:  Prepandial:   less than 140 mg/dL      Peak postprandial:   less than 180 mg/dL (1-2 hours)      Critically ill patients:  140 - 180 mg/dL    Latest Reference Range & Units 07/17/22 06:49 07/17/22 11:58 07/17/22 16:52 07/18/22 06:41  Glucose-Capillary 70 - 99 mg/dL 578 (H) 469 (H) 629 (H) 302 (H)   Review of Glycemic Control  Diabetes history: DM2 Outpatient Diabetes medications: Farxiga 10 mg daily, Victoza 1.2 mg daily, Metformin 1500 mg daily Current orders for Inpatient glycemic control: Semglee 28 units QPM, Novolog 3 units TID with meals   Inpatient Diabetes Program Recommendations:     Insulin: Noted Novolog correction scale was discontinued. Please consider increasing Semglee to 33 units at bedtime, meal coverage to 5 units TId with meals, and add back Novolog 0-15 units TID with meals and Novolog 0-5 units at bedtime.    Thanks, Orlando Penner, RN, MSN, CDCES Diabetes Coordinator Inpatient Diabetes Program [phone] (Team Pager from 8am to 5pm)

## 2022-07-18 NOTE — Plan of Care (Signed)
  Problem: Health Behavior/Discharge Planning: Goal: Ability to manage health-related needs will improve Outcome: Progressing   Problem: Clinical Measurements: Goal: Respiratory complications will improve Outcome: Progressing   Problem: Nutrition: Goal: Adequate nutrition will be maintained Outcome: Progressing   Problem: Coping: Goal: Level of anxiety will decrease Outcome: Progressing   Problem: Elimination: Goal: Will not experience complications related to urinary retention Outcome: Progressing   Problem: Pain Managment: Goal: General experience of comfort will improve Outcome: Progressing   Problem: Safety: Goal: Ability to remain free from injury will improve Outcome: Progressing

## 2022-07-18 NOTE — Progress Notes (Addendum)
Rounding Note    Patient Name: Alisha Williams Date of Encounter: 07/18/2022  [facility] Cardiologist: Dr Jens Som  Subjective   Pt denies CP or dyspnea. Noted to have some bladder pressure and cloudy urine today.  No dysuria, burning or incontinence. No fever or leukocytosis.  Inpatient Medications    Scheduled Meds:  aspirin EC  81 mg Oral Daily   atorvastatin  80 mg Oral Daily   ezetimibe  10 mg Oral Daily   insulin aspart  0-15 Units Subcutaneous TID WC   insulin aspart  0-5 Units Subcutaneous QHS   insulin aspart  3 Units Subcutaneous TID WC   insulin glargine-yfgn  28 Units Subcutaneous q1800   levothyroxine  125 mcg Oral Daily   metoprolol tartrate  12.5 mg Oral BID   sodium chloride flush  3 mL Intravenous Q12H   Continuous Infusions:  sodium chloride     heparin 1,700 Units/hr (07/18/22 0805)   PRN Meds: sodium chloride, acetaminophen, acetaminophen, dextrose, nitroGLYCERIN, ondansetron (ZOFRAN) IV, ondansetron (ZOFRAN) IV, sodium chloride flush   Vital Signs    Vitals:   07/18/22 0418 07/18/22 0645 07/18/22 0749 07/18/22 1056  BP: 104/67  111/77 132/85  Pulse: 73 84 80 92  Resp: 18 (!) 21 19   Temp: 98.1 F (36.7 C)  97.8 F (36.6 C)   TempSrc: Oral  Oral   SpO2: 99% 95% 99%   Weight:  95.9 kg    Height:        Intake/Output Summary (Last 24 hours) at 07/18/2022 1101 Last data filed at 07/18/2022 0646 Gross per 24 hour  Intake 433.46 ml  Output 1200 ml  Net -766.54 ml      07/18/2022    6:45 AM 07/17/2022    6:58 AM 07/16/2022    5:00 AM  Last 3 Weights  Weight (lbs) 211 lb 6.7 oz 218 lb 0.6 oz 217 lb 9.5 oz  Weight (kg) 95.9 kg 98.9 kg 98.7 kg      Telemetry    Sinus rhythm - Personally Reviewed  Physical Exam   GEN: NAD Neck: No JVD, supple Cardiac: RRR Respiratory: CTA GI: Soft, NT/ND MS: No edema; radial cath site with no hematoma Neuro: Grossly intact Psych: Normal affect   Labs    High Sensitivity Troponin:    Recent Labs  Lab 07/13/22 2245 07/14/22 0000  TROPONINIHS 3,215* 2,338*     Chemistry Recent Labs  Lab 07/13/22 2245 07/14/22 0749 07/14/22 1227 07/15/22 0003 07/16/22 0020 07/17/22 0039  NA 136 133*   < > 134* 132* 130*  K 3.7 3.7   < > 3.7 4.3 3.6  CL 98 101   < > 100 100 98  CO2 19* 22   < > 20* 19* 16*  GLUCOSE 206* 146*   < > 154* 236* 289*  BUN 13 13   < > 14 23 24*  CREATININE 0.70 0.55   < > 0.69 0.63 0.62  CALCIUM 8.6* 8.1*   < > 8.4* 8.7* 8.8*  MG  --  2.1  --   --   --   --   PROT 6.7  --   --   --   --   --   ALBUMIN 3.5  --   --   --   --   --   AST 31  --   --   --   --   --   ALT 21  --   --   --   --   --  ALKPHOS 47  --   --   --   --   --   BILITOT 1.7*  --   --   --   --   --   GFRNONAA >60 >60   < > >60 >60 >60  ANIONGAP 19* 10   < > 14 13 16*   < > = values in this interval not displayed.    Lipids  Recent Labs  Lab 07/14/22 0749  CHOL 223*  TRIG 442*  HDL 35*  LDLCALC UNABLE TO CALCULATE IF TRIGLYCERIDE OVER 400 mg/dL  CHOLHDL 6.4    Hematology Recent Labs  Lab 07/16/22 0020 07/17/22 0039 07/18/22 0024  WBC 7.7 8.0 6.6  RBC 4.92 4.83 4.87  HGB 13.1 13.1 12.9  HCT 41.9 40.0 40.6  MCV 85.2 82.8 83.4  MCH 26.6 27.1 26.5  MCHC 31.3 32.8 31.8  RDW 13.4 13.2 13.4  PLT 230 274 238   Thyroid  Recent Labs  Lab 07/14/22 1227  TSH 5.031*    BNP Recent Labs  Lab 07/14/22 0743  BNP 217.7*     Radiology    VAS US DOPPLER PRE CABG  Result Date: 07/17/2022 PREOPERATIVE VASCULAR EVALUATION Patient Name:  ALEESHA RINGSTAD  Date of Exam:   07/17/2022 Medical Rec #: [id number]    Accession #:    [id number] Date of Birth: February 23, 1955   Patient Gender: F Patient Age:   67 years Exam Location:  [facility] Procedure:      VAS US DOPPLER PRE CABG Referring Phys: Viviann Spare HENDRICKSON --------------------------------------------------------------------------------  Indications:      Pre-CABG. Risk Factors:     Hyperlipidemia, Diabetes, no  history of smoking, prior MI,                   coronary artery disease. Limitations:      Poor ultrasound/tissue interface, coughing Comparison Study: No previous exams Performing Technologist: Hill, Jody RVT, RDMS  Examination Guidelines: A complete evaluation includes B-mode imaging, spectral Doppler, color Doppler, and power Doppler as needed of all accessible portions of each vessel. Bilateral testing is considered an integral part of a complete examination. Limited examinations for reoccurring indications may be performed as noted.  Right Carotid Findings: +----------+--------+--------+--------+--------+--------+           PSV cm/sEDV cm/sStenosisDescribeComments +----------+--------+--------+--------+--------+--------+ CCA Prox  104     9                                +----------+--------+--------+--------+--------+--------+ CCA Distal55      11                               +----------+--------+--------+--------+--------+--------+ ICA Prox  76      13              calcific         +----------+--------+--------+--------+--------+--------+ ICA Distal54      19                               +----------+--------+--------+--------+--------+--------+ ECA       108     18                               +----------+--------+--------+--------+--------+--------+ +----------+--------+-------+----------------+------------+  PSV cm/sEDV cmsDescribe        Arm Pressure +----------+--------+-------+----------------+------------+ Subclavian140            Multiphasic, WNL             +----------+--------+-------+----------------+------------+ +---------+--------+--+--------+--+---------+ VertebralPSV cm/s47EDV cm/s12Antegrade +---------+--------+--+--------+--+---------+ Left Carotid Findings: +----------+--------+--------+--------+--------+--------+           PSV cm/sEDV cm/sStenosisDescribeComments  +----------+--------+--------+--------+--------+--------+ CCA Prox  99      13                               +----------+--------+--------+--------+--------+--------+ CCA Distal65      14                               +----------+--------+--------+--------+--------+--------+ ICA Prox  49      19                               +----------+--------+--------+--------+--------+--------+ ICA Distal54      18                               +----------+--------+--------+--------+--------+--------+ ECA       84      11                               +----------+--------+--------+--------+--------+--------+ +----------+--------+--------+----------------+------------+ SubclavianPSV cm/sEDV cm/sDescribe        Arm Pressure +----------+--------+--------+----------------+------------+           101             Multiphasic, WNL             +----------+--------+--------+----------------+------------+ +---------+--------+--+--------+-+---------+ VertebralPSV cm/s38EDV cm/s9Antegrade +---------+--------+--+--------+-+---------+  ABI Findings: +---------+------------------+-----+---------+--------+ Right    Rt Pressure (mmHg)IndexWaveform Comment  +---------+------------------+-----+---------+--------+ Brachial 124                    triphasic         +---------+------------------+-----+---------+--------+ PTA      121               0.92 biphasic          +---------+------------------+-----+---------+--------+ DP       126               0.96 biphasic          +---------+------------------+-----+---------+--------+ Great Toe121               0.92 Normal            +---------+------------------+-----+---------+--------+ +---------+------------------+-----+---------+-------+ Left     Lt Pressure (mmHg)IndexWaveform Comment +---------+------------------+-----+---------+-------+ Brachial 131                    triphasic         +---------+------------------+-----+---------+-------+ PTA      127               0.97 biphasic         +---------+------------------+-----+---------+-------+ DP       138               1.05 biphasic         +---------+------------------+-----+---------+-------+ Great Toe107               0.82 Normal           +---------+------------------+-----+---------+-------+  Right Doppler Findings: +--------+--------+-----+---------+--------+ Site    PressureIndexDoppler  Comments +--------+--------+-----+---------+--------+ [id number]          triphasic         +--------+--------+-----+---------+--------+ Radial               triphasic         +--------+--------+-----+---------+--------+ Ulnar                triphasic         +--------+--------+-----+---------+--------+  Left Doppler Findings: +--------+--------+-----+---------+--------+ Site    PressureIndexDoppler  Comments +--------+--------+-----+---------+--------+ [id number]          triphasic         +--------+--------+-----+---------+--------+ Radial               triphasic         +--------+--------+-----+---------+--------+ Ulnar                triphasic         +--------+--------+-----+---------+--------+   Summary: Right Carotid: The extracranial vessels were near-normal with only minimal wall                thickening or plaque. Left Carotid: The extracranial vessels were near-normal with only minimal wall               thickening or plaque. Vertebrals:  Bilateral vertebral arteries demonstrate antegrade flow. Subclavians: Normal flow hemodynamics were seen in bilateral subclavian              arteries. Right ABI: Resting right ankle-brachial index is within normal range. The right toe-brachial index is normal. Left ABI: Resting left ankle-brachial index is within normal range. The left toe-brachial index is normal. Bilateral Extremity: Doppler waveforms remain within normal limits with  compression bilaterally for the radial arteries. Doppler waveforms remain within normal limits with compression bilaterally for the ulnar arteries.  Electronically signed by Waverly Ferrari MD on 07/17/2022 at 7:15:12 PM.    Final       Patient Profile     67 y.o. female with past medical history of diabetes mellitus, hypothyroidism admitted with non-ST elevation myocardial infarction.  Cardiac catheterization revealed severe three-vessel coronary artery disease, ejection fraction 45 to 50% and elevated left ventricular end-diastolic pressure.  Echocardiogram shows ejection fraction 50 to 55%, mild left ventricular hypertrophy, hypokinesis of the basal inferior wall and inferolateral wall, mild mitral regurgitation.  Assessment & Plan    1 non-ST elevation myocardial infarction-patient remains pain-free.  Continue aspirin, heparin, statin and metoprolol.  Cardiac catheterization reveals severe three-vessel coronary artery disease and echocardiogram shows preserved LV function. Seen by Dr Dorris Fetch and CABG tentatively scheduled for Wednesday - patient and husband want to discuss timing further with Dr. Dorris Fetch.  2 acute diastolic congestive heart failure-left ventricular end-diastolic pressure elevated at time of cardiac catheterization.  She is symptomatically improved and appears euvolemic. Sodium is trending down, but creatinine stable. Stopped lasix - repeat BMET tomorrow.  3 diabetes mellitus-continue present regimen.  Follow CBGs. Appreciate diabetes coordinator recommendations, have made suggested adjustments.  4. Dyslipidemia - noted to have high LP(a) of 238.2 nmol/L. TC 223, TG 442, HDL 35, dLDL 124.  On atorva 80 and ezetimibe. Good candidate to add PCSK9i at follow-up - with target LDL <55 if achievable.  5. Bladder pressure - has noted cloudy urine and pressure around the bladder -says similar to UTI in the past. No leukocytosis. Will check UA with reflex to micro  today.  They would like to follow-up in [facility]  if possible.  For questions or updates, please contact [facility] Please consult [email or web address] for contact info under   Chrystie Nose, MD, Milagros Loll  [place]  [facility]  Medical Director of the [facility]  Card[facility] Diplomate of the American Board of Clinical Lipidology Attending Cardiologist  Direct Dial: [phone]  Fax: [phone]  Website:  [email or web address]  Chrystie Nose, MD  07/18/2022, 11:01 AM

## 2022-07-18 NOTE — Progress Notes (Signed)
ANTICOAGULATION CONSULT NOTE   Pharmacy Consult for heparin Indication: chest pain/ACS  Allergies  Allergen Reactions   Metformin And Related Other (See Comments)    Weakness & fatigue    Patient Measurements: Height: 5\' 8"  (172.7 cm) Weight: 95.9 kg (211 lb 6.7 oz) IBW/kg (Calculated) : 63.9 Heparin Dosing Weight: 86 kg  Vital Signs: Temp: 97.8 F (36.6 C) (06/23 0749) Temp Source: Oral (06/23 0749) BP: 132/85 (06/23 1056) Pulse Rate: 92 (06/23 1056)  Labs: Recent Labs    07/16/22 0020 07/17/22 0039 07/18/22 0024  HGB 13.1 13.1 12.9  HCT 41.9 40.0 40.6  PLT 230 274 238  HEPARINUNFRC 0.34 0.50 0.50  CREATININE 0.63 0.62  --      Estimated Creatinine Clearance: 84.9 mL/min (by C-G formula based on SCr of 0.62 mg/dL).   Medical History: Past Medical History:  Diagnosis Date   Anemia    nos   Anxiety    Depression    Diabetes mellitus without complication (HCC)    Hypothyroidism     Assessment: 67 yo W transferred from [facility] ED for chest pain and troponin elevation. Heparin 4400 units given at noon and 960 units/hr ordered. Per RN, patient arrived with heparin running. Patient is s/p cath with severe 3 vessel disease. No anticoagulation prior to admission. Pharmacy consulted for heparin.     Heparin level remains therapeutic at 0.5 on 1700 units/hr. Tentative CABG possibly on 6/26. CBC stable. No overt bleeding noted.  Goal of Therapy:  Heparin level 0.3-0.7 units/ml Monitor platelets by anticoagulation protocol: Yes   Plan:  Continue heparin 1700 units/hr Monitor daily heparin level, CBC Monitor for signs/symptoms of bleeding   Thank you for involving pharmacy in this patient's care.  Enos Fling, PharmD PGY2 Pharmacy Resident 07/18/2022 11:09 AM

## 2022-07-18 NOTE — Progress Notes (Signed)
Mobility Specialist: Progress Note   07/18/22 1030  Mobility  Activity Ambulated with assistance in hallway  Level of Assistance Contact guard assist, steadying assist  Assistive Device None  Distance Ambulated (ft) 400 ft  Activity Response Tolerated well  Mobility Referral Yes  $Mobility charge 1 Mobility  Mobility Specialist Start Time (ACUTE ONLY) 1011  Mobility Specialist Stop Time (ACUTE ONLY) 1029  Mobility Specialist Time Calculation (min) (ACUTE ONLY) 18 min   During Mobility: 111 HR Post-Mobility: 98 HR, 97% SpO2  Pt received in the bed and agreeable to mobility. Mod I with bed mobility and contact guard during ambulation. No c/o throughout. Pt sitting EOB after session with call bell and phone in reach.   Alisha Williams Mobility Specialist Please contact via SecureChat or Rehab office at [phone]

## 2022-07-19 DIAGNOSIS — I214 Non-ST elevation (NSTEMI) myocardial infarction: Secondary | ICD-10-CM | POA: Diagnosis not present

## 2022-07-19 DIAGNOSIS — N390 Urinary tract infection, site not specified: Secondary | ICD-10-CM | POA: Diagnosis not present

## 2022-07-19 LAB — HEPARIN LEVEL (UNFRACTIONATED): Heparin Unfractionated: 0.5 IU/mL (ref 0.30–0.70)

## 2022-07-19 LAB — GLUCOSE, CAPILLARY
Glucose-Capillary: 246 mg/dL — ABNORMAL HIGH (ref 70–99)
Glucose-Capillary: 280 mg/dL — ABNORMAL HIGH (ref 70–99)
Glucose-Capillary: 268 mg/dL — ABNORMAL HIGH (ref 70–99)
Glucose-Capillary: 237 mg/dL — ABNORMAL HIGH (ref 70–99)

## 2022-07-19 LAB — BASIC METABOLIC PANEL
Anion gap: 10 (ref 5–15)
BUN: 15 mg/dL (ref 8–23)
CO2: 22 mmol/L (ref 22–32)
Calcium: 9 mg/dL (ref 8.9–10.3)
Chloride: 100 mmol/L (ref 98–111)
Creatinine, Ser: 0.68 mg/dL (ref 0.44–1.00)
GFR, Estimated: >60 mL/min (ref >60)
Glucose, Bld: 215 mg/dL — ABNORMAL HIGH (ref 70–99)
Potassium: 3.8 mmol/L (ref 3.5–5.1)
Sodium: 132 mmol/L — ABNORMAL LOW (ref 135–145)

## 2022-07-19 MED ORDER — INSULIN GLARGINE-YFGN 100 UNIT/ML ~~LOC~~ SOLN
35.0000 [IU] | Freq: Every day | SUBCUTANEOUS | Status: DC
  Administered 2022-07-19: 35 [IU] via SUBCUTANEOUS
  Filled 2022-07-19 (×3): qty 0.35

## 2022-07-19 MED ORDER — INSULIN ASPART 100 UNIT/ML IJ SOLN
5.0000 [IU] | Freq: Three times a day (TID) | INTRAMUSCULAR | Status: DC
  Administered 2022-07-19 (×2): 5 [IU] via SUBCUTANEOUS

## 2022-07-19 MED ORDER — CEPHALEXIN 500 MG PO CAPS
500.0000 mg | ORAL_CAPSULE | Freq: Two times a day (BID) | ORAL | Status: DC
  Administered 2022-07-19 – 2022-07-20 (×4): 500 mg via ORAL
  Filled 2022-07-19 (×6): qty 1

## 2022-07-19 NOTE — Inpatient Diabetes Management (Signed)
Inpatient Diabetes Program Recommendations  AACE/ADA: New Consensus Statement on Inpatient Glycemic Control (2015)  Target Ranges:  Prepandial:   less than 140 mg/dL      Peak postprandial:   less than 180 mg/dL (1-2 hours)      Critically ill patients:  140 - 180 mg/dL   Lab Results  Component Value Date   GLUCAP 246 (H) 07/19/2022   HGBA1C 11.2 (H) 07/13/2022    Review of Glycemic Control  Latest Reference Range & Units 07/18/22 06:41 07/18/22 11:42 07/18/22 15:43 07/18/22 21:13 07/19/22 06:24  Glucose-Capillary 70 - 99 mg/dL 161 (H) 096 (H) 045 (H) 200 (H) 246 (H)  (H): Data is abnormally high Diabetes history: DM2 Outpatient Diabetes medications: Farxiga 10 mg daily, Victoza 1.2 mg daily, Metformin 1500 mg daily Current orders for Inpatient glycemic control: Semglee 28 units QPM, Novolog 3 units TID with meals, Novolog 0-15 units TID & HS   Inpatient Diabetes Program Recommendations:     Consider increasing Semglee to 36 units at bedtime and increase meal coverage to 5 units TID with meals.  Thanks, Lujean Rave, MSN, RNC-OB Diabetes Coordinator [phone] (8a-5p)

## 2022-07-19 NOTE — Progress Notes (Signed)
CARDIAC REHAB PHASE I    Pre-op OHS education including OHS handout, OHS booklet, IS use, home needs at discharge, pain management, mobility importance and sternal precautions reviewed. All questions and concerns addressed. Will continue to follow.   [id number]  Woodroe Chen, RN BSN 07/19/2022 9:24 AM

## 2022-07-19 NOTE — Progress Notes (Signed)
ANTICOAGULATION CONSULT NOTE   Pharmacy Consult for heparin Indication: chest pain/ACS  Allergies  Allergen Reactions   Metformin And Related Other (See Comments)    Weakness & fatigue    Patient Measurements: Height: 5\' 8"  (172.7 cm) Weight: 99.4 kg (219 lb 2.2 oz) IBW/kg (Calculated) : 63.9 Heparin Dosing Weight: 86 kg  Vital Signs: Temp: 97.8 F (36.6 C) (06/24 0346) Temp Source: Oral (06/24 0346) BP: 114/77 (06/24 0346) Pulse Rate: 78 (06/24 0346)  Labs: Recent Labs    07/17/22 0039 07/18/22 0024 07/19/22 0021  HGB 13.1 12.9  --   HCT 40.0 40.6  --   PLT 274 238  --   HEPARINUNFRC 0.50 0.50 0.50  CREATININE 0.62  --  0.68     Estimated Creatinine Clearance: 86.4 mL/min (by C-G formula based on SCr of 0.68 mg/dL).   Medical History: Past Medical History:  Diagnosis Date   Anemia    nos   Anxiety    Depression    Diabetes mellitus without complication (HCC)    Hypothyroidism     Assessment: 67 yo W transferred from [facility] ED for chest pain and troponin elevation. . Patient is s/p cath with severe 3 vessel disease.   CABG planned for 6/26?  Heparin level remains therapeutic at 0.5   Goal of Therapy:  Heparin level 0.3-0.7 units/ml Monitor platelets by anticoagulation protocol: Yes   Plan:  Continue heparin 1700 units/hr Monitor daily heparin level, CBC Monitor for signs/symptoms of bleeding   Thank you for involving pharmacy in this patient's care. Okey Regal, PharmD  07/19/2022 8:11 AM

## 2022-07-19 NOTE — Progress Notes (Signed)
Rounding Note    Patient Name: Alisha Williams Date of Encounter: 07/19/2022  [facility] Cardiologist: Dr Jens Som  Subjective   Pt denies CP or dyspnea. Noted to have some bladder pressure and cloudy urine today.  No dysuria, burning or incontinence. No fever or leukocytosis.  Inpatient Medications    Scheduled Meds:  aspirin EC  81 mg Oral Daily   atorvastatin  80 mg Oral Daily   ezetimibe  10 mg Oral Daily   insulin aspart  0-15 Units Subcutaneous TID WC   insulin aspart  0-5 Units Subcutaneous QHS   insulin aspart  3 Units Subcutaneous TID WC   insulin glargine-yfgn  28 Units Subcutaneous q1800   levothyroxine  125 mcg Oral Daily   metoprolol tartrate  12.5 mg Oral BID   sodium chloride flush  3 mL Intravenous Q12H   Continuous Infusions:  sodium chloride     heparin 1,700 Units/hr (07/19/22 0813)   PRN Meds: sodium chloride, acetaminophen, acetaminophen, dextrose, nitroGLYCERIN, ondansetron (ZOFRAN) IV, ondansetron (ZOFRAN) IV, sodium chloride flush   Vital Signs    Vitals:   07/18/22 2337 07/19/22 0346 07/19/22 0628 07/19/22 0800  BP: 112/73 114/77  124/67  Pulse: 74 78  90  Resp: (!) 22 19  19   Temp: 98.3 F (36.8 C) 97.8 F (36.6 C)  97.6 F (36.4 C)  TempSrc: Oral Oral  Oral  SpO2: 96% 97%  94%  Weight:   99.4 kg   Height:        Intake/Output Summary (Last 24 hours) at 07/19/2022 0927 Last data filed at 07/19/2022 0813 Gross per 24 hour  Intake 880.38 ml  Output 800 ml  Net 80.38 ml      07/19/2022    6:28 AM 07/18/2022    6:45 AM 07/17/2022    6:58 AM  Last 3 Weights  Weight (lbs) 219 lb 2.2 oz 211 lb 6.7 oz 218 lb 0.6 oz  Weight (kg) 99.4 kg 95.9 kg 98.9 kg      Telemetry    Sinus rhythm - Personally Reviewed  Physical Exam   GEN: NAD Neck: No JVD, supple Cardiac: RRR Respiratory: CTA GI: Soft, NT/ND MS: No edema; radial cath site with no hematoma Neuro: Grossly intact Psych: Normal affect   Labs    High Sensitivity  Troponin:   Recent Labs  Lab 07/13/22 2245 07/14/22 0000  TROPONINIHS 3,215* 2,338*     Chemistry Recent Labs  Lab 07/13/22 2245 07/14/22 0749 07/14/22 1227 07/16/22 0020 07/17/22 0039 07/19/22 0021  NA 136 133*   < > 132* 130* 132*  K 3.7 3.7   < > 4.3 3.6 3.8  CL 98 101   < > 100 98 100  CO2 19* 22   < > 19* 16* 22  GLUCOSE 206* 146*   < > 236* 289* 215*  BUN 13 13   < > 23 24* 15  CREATININE 0.70 0.55   < > 0.63 0.62 0.68  CALCIUM 8.6* 8.1*   < > 8.7* 8.8* 9.0  MG  --  2.1  --   --   --   --   PROT 6.7  --   --   --   --   --   ALBUMIN 3.5  --   --   --   --   --   AST 31  --   --   --   --   --   ALT 21  --   --   --   --   --  ALKPHOS 47  --   --   --   --   --   BILITOT 1.7*  --   --   --   --   --   GFRNONAA >60 >60   < > >60 >60 >60  ANIONGAP 19* 10   < > 13 16* 10   < > = values in this interval not displayed.    Lipids  Recent Labs  Lab 07/14/22 0749  CHOL 223*  TRIG 442*  HDL 35*  LDLCALC UNABLE TO CALCULATE IF TRIGLYCERIDE OVER 400 mg/dL  CHOLHDL 6.4    Hematology Recent Labs  Lab 07/16/22 0020 07/17/22 0039 07/18/22 0024  WBC 7.7 8.0 6.6  RBC 4.92 4.83 4.87  HGB 13.1 13.1 12.9  HCT 41.9 40.0 40.6  MCV 85.2 82.8 83.4  MCH 26.6 27.1 26.5  MCHC 31.3 32.8 31.8  RDW 13.4 13.2 13.4  PLT 230 274 238   Thyroid  Recent Labs  Lab 07/14/22 1227  TSH 5.031*    BNP Recent Labs  Lab 07/14/22 0743  BNP 217.7*     Radiology    VAS US DOPPLER PRE CABG  Result Date: 07/17/2022 PREOPERATIVE VASCULAR EVALUATION Patient Name:  Alisha Williams  Date of Exam:   07/17/2022 Medical Rec #: [id number]    Accession #:    [id number] Date of Birth: 10/08/1955   Patient Gender: F Patient Age:   67 years Exam Location:  [facility] Procedure:      VAS US DOPPLER PRE CABG Referring Phys: Viviann Spare HENDRICKSON --------------------------------------------------------------------------------  Indications:      Pre-CABG. Risk Factors:     Hyperlipidemia,  Diabetes, no history of smoking, prior MI,                   coronary artery disease. Limitations:      Poor ultrasound/tissue interface, coughing Comparison Study: No previous exams Performing Technologist: Hill, Jody RVT, RDMS  Examination Guidelines: A complete evaluation includes B-mode imaging, spectral Doppler, color Doppler, and power Doppler as needed of all accessible portions of each vessel. Bilateral testing is considered an integral part of a complete examination. Limited examinations for reoccurring indications may be performed as noted.  Right Carotid Findings: +----------+--------+--------+--------+--------+--------+           PSV cm/sEDV cm/sStenosisDescribeComments +----------+--------+--------+--------+--------+--------+ CCA Prox  104     9                                +----------+--------+--------+--------+--------+--------+ CCA Distal55      11                               +----------+--------+--------+--------+--------+--------+ ICA Prox  76      13              calcific         +----------+--------+--------+--------+--------+--------+ ICA Distal54      19                               +----------+--------+--------+--------+--------+--------+ ECA       108     18                               +----------+--------+--------+--------+--------+--------+ +----------+--------+-------+----------------+------------+  PSV cm/sEDV cmsDescribe        Arm Pressure +----------+--------+-------+----------------+------------+ Subclavian140            Multiphasic, WNL             +----------+--------+-------+----------------+------------+ +---------+--------+--+--------+--+---------+ VertebralPSV cm/s47EDV cm/s12Antegrade +---------+--------+--+--------+--+---------+ Left Carotid Findings: +----------+--------+--------+--------+--------+--------+           PSV cm/sEDV cm/sStenosisDescribeComments  +----------+--------+--------+--------+--------+--------+ CCA Prox  99      13                               +----------+--------+--------+--------+--------+--------+ CCA Distal65      14                               +----------+--------+--------+--------+--------+--------+ ICA Prox  49      19                               +----------+--------+--------+--------+--------+--------+ ICA Distal54      18                               +----------+--------+--------+--------+--------+--------+ ECA       84      11                               +----------+--------+--------+--------+--------+--------+ +----------+--------+--------+----------------+------------+ SubclavianPSV cm/sEDV cm/sDescribe        Arm Pressure +----------+--------+--------+----------------+------------+           101             Multiphasic, WNL             +----------+--------+--------+----------------+------------+ +---------+--------+--+--------+-+---------+ VertebralPSV cm/s38EDV cm/s9Antegrade +---------+--------+--+--------+-+---------+  ABI Findings: +---------+------------------+-----+---------+--------+ Right    Rt Pressure (mmHg)IndexWaveform Comment  +---------+------------------+-----+---------+--------+ Brachial 124                    triphasic         +---------+------------------+-----+---------+--------+ PTA      121               0.92 biphasic          +---------+------------------+-----+---------+--------+ DP       126               0.96 biphasic          +---------+------------------+-----+---------+--------+ Great Toe121               0.92 Normal            +---------+------------------+-----+---------+--------+ +---------+------------------+-----+---------+-------+ Left     Lt Pressure (mmHg)IndexWaveform Comment +---------+------------------+-----+---------+-------+ Brachial 131                    triphasic         +---------+------------------+-----+---------+-------+ PTA      127               0.97 biphasic         +---------+------------------+-----+---------+-------+ DP       138               1.05 biphasic         +---------+------------------+-----+---------+-------+ Great Toe107               0.82 Normal           +---------+------------------+-----+---------+-------+  Right Doppler Findings: +--------+--------+-----+---------+--------+ Site    PressureIndexDoppler  Comments +--------+--------+-----+---------+--------+ [id number]          triphasic         +--------+--------+-----+---------+--------+ Radial               triphasic         +--------+--------+-----+---------+--------+ Ulnar                triphasic         +--------+--------+-----+---------+--------+  Left Doppler Findings: +--------+--------+-----+---------+--------+ Site    PressureIndexDoppler  Comments +--------+--------+-----+---------+--------+ [id number]          triphasic         +--------+--------+-----+---------+--------+ Radial               triphasic         +--------+--------+-----+---------+--------+ Ulnar                triphasic         +--------+--------+-----+---------+--------+   Summary: Right Carotid: The extracranial vessels were near-normal with only minimal wall                thickening or plaque. Left Carotid: The extracranial vessels were near-normal with only minimal wall               thickening or plaque. Vertebrals:  Bilateral vertebral arteries demonstrate antegrade flow. Subclavians: Normal flow hemodynamics were seen in bilateral subclavian              arteries. Right ABI: Resting right ankle-brachial index is within normal range. The right toe-brachial index is normal. Left ABI: Resting left ankle-brachial index is within normal range. The left toe-brachial index is normal. Bilateral Extremity: Doppler waveforms remain within normal limits with  compression bilaterally for the radial arteries. Doppler waveforms remain within normal limits with compression bilaterally for the ulnar arteries.  Electronically signed by Waverly Ferrari MD on 07/17/2022 at 7:15:12 PM.    Final       Patient Profile     67 y.o. female with past medical history of diabetes mellitus, hypothyroidism admitted with non-ST elevation myocardial infarction.  Cardiac catheterization revealed severe three-vessel coronary artery disease, ejection fraction 45 to 50% and elevated left ventricular end-diastolic pressure.  Echocardiogram shows ejection fraction 50 to 55%, mild left ventricular hypertrophy, hypokinesis of the basal inferior wall and inferolateral wall, mild mitral regurgitation.  Assessment & Plan    1 non-ST elevation myocardial infarction-patient remains pain-free.  Continue aspirin, heparin, statin and metoprolol.  Cardiac catheterization reveals severe three-vessel coronary artery disease and echocardiogram shows preserved LV function. Seen by Dr Dorris Fetch and CABG tentatively scheduled for Wednesday - patient and husband want to discuss timing further with Dr. Dorris Fetch.  2 acute diastolic congestive heart failure-left ventricular end-diastolic pressure elevated at time of cardiac catheterization.  She is symptomatically improved and appears euvolemic. Sodium is trending down, but creatinine stable. Stopped lasix - creatinine stable, sodium improving. Appears euvolemic.  3 diabetes mellitus-continue present regimen.  Follow CBGs. Appreciate diabetes coordinator recommendations, have made suggested adjustments.  4. Dyslipidemia - noted to have high LP(a) of 238.2 nmol/L. TC 223, TG 442, HDL 35, dLDL 124.  On atorva 80 and ezetimibe. Good candidate to add PCSK9i at follow-up - with target LDL <55 if achievable.  5. Bladder pressure - has noted cloudy urine and pressure around the bladder -Urinalysis shows hazy appearance, large hemoglobin, trace  leukocytes, rare bacteria and yeast. Given upcoming surgery, will favor treatment (D/w Dr. Ella Jubilee, hospital  medicine), 3 day course of cephalexin 500 mg BID.  They would like to follow-up in [place] if possible.  For questions or updates, please contact [facility] Please consult [email or web address] for contact info under   Chrystie Nose, MD, Milagros Loll  [place]  Medical Director of the Advanced Lipid Disorders &  Cardiovascular Risk Reduction Clinic Diplomate of the American Board of Clinical Lipidology Attending Cardiologist  Direct Dial: [phone]  Fax: [phone]  Website:  [email or web address]  Chrystie Nose, MD  07/19/2022, 9:27 AM

## 2022-07-20 ENCOUNTER — Inpatient Hospital Stay (HOSPITAL_COMMUNITY): Payer: 59

## 2022-07-20 DIAGNOSIS — N3000 Acute cystitis without hematuria: Secondary | ICD-10-CM | POA: Diagnosis not present

## 2022-07-20 DIAGNOSIS — K5900 Constipation, unspecified: Secondary | ICD-10-CM

## 2022-07-20 DIAGNOSIS — I214 Non-ST elevation (NSTEMI) myocardial infarction: Secondary | ICD-10-CM | POA: Diagnosis not present

## 2022-07-20 LAB — GLUCOSE, CAPILLARY
Glucose-Capillary: 204 mg/dL — ABNORMAL HIGH (ref 70–99)
Glucose-Capillary: 235 mg/dL — ABNORMAL HIGH (ref 70–99)
Glucose-Capillary: 195 mg/dL — ABNORMAL HIGH (ref 70–99)
Glucose-Capillary: 182 mg/dL — ABNORMAL HIGH (ref 70–99)

## 2022-07-20 LAB — BASIC METABOLIC PANEL
Anion gap: 10 (ref 5–15)
BUN: 12 mg/dL (ref 8–23)
CO2: 22 mmol/L (ref 22–32)
Calcium: 8.7 mg/dL — ABNORMAL LOW (ref 8.9–10.3)
Chloride: 101 mmol/L (ref 98–111)
Creatinine, Ser: 0.58 mg/dL (ref 0.44–1.00)
GFR, Estimated: >60 mL/min (ref >60)
Glucose, Bld: 228 mg/dL — ABNORMAL HIGH (ref 70–99)
Potassium: 4 mmol/L (ref 3.5–5.1)
Sodium: 133 mmol/L — ABNORMAL LOW (ref 135–145)

## 2022-07-20 LAB — URINALYSIS, ROUTINE W REFLEX MICROSCOPIC
Bilirubin Urine: NEGATIVE
Glucose, UA: 150 mg/dL — AB
Ketones, ur: NEGATIVE mg/dL
Nitrite: NEGATIVE
Protein, ur: NEGATIVE mg/dL
RBC / HPF: >50 RBC/hpf (ref 0–5)
Specific Gravity, Urine: 1.011 (ref 1.005–1.030)
WBC, UA: >50 WBC/hpf (ref 0–5)
pH: 5 (ref 5.0–8.0)

## 2022-07-20 LAB — HEPARIN LEVEL (UNFRACTIONATED): Heparin Unfractionated: 0.6 IU/mL (ref 0.30–0.70)

## 2022-07-20 LAB — TYPE AND SCREEN

## 2022-07-20 LAB — ABO/RH: ABO/RH(D): O POS

## 2022-07-20 LAB — SARS CORONAVIRUS 2 BY RT PCR: SARS Coronavirus 2 by RT PCR: NEGATIVE

## 2022-07-20 MED ORDER — CEFAZOLIN SODIUM-DEXTROSE 2-4 GM/100ML-% IV SOLN
2.0000 g | INTRAVENOUS | Status: DC
  Filled 2022-07-20: qty 100

## 2022-07-20 MED ORDER — TRANEXAMIC ACID (OHS) BOLUS VIA INFUSION
15.0000 mg/kg | INTRAVENOUS | Status: AC
  Administered 2022-07-21: 1512 mg via INTRAVENOUS
  Filled 2022-07-20: qty 1512

## 2022-07-20 MED ORDER — INSULIN GLARGINE-YFGN 100 UNIT/ML ~~LOC~~ SOLN
35.0000 [IU] | Freq: Two times a day (BID) | SUBCUTANEOUS | Status: DC
  Administered 2022-07-20 (×2): 35 [IU] via SUBCUTANEOUS
  Filled 2022-07-20 (×3): qty 0.35

## 2022-07-20 MED ORDER — EPINEPHRINE HCL 5 MG/250ML IV SOLN IN NS
0.0000 ug/min | INTRAVENOUS | Status: DC
  Filled 2022-07-20: qty 250

## 2022-07-20 MED ORDER — MAGNESIUM SULFATE 50 % IJ SOLN
40.0000 meq | INTRAMUSCULAR | Status: DC
  Filled 2022-07-20: qty 9.85

## 2022-07-20 MED ORDER — METOPROLOL TARTRATE 12.5 MG HALF TABLET
12.5000 mg | ORAL_TABLET | Freq: Once | ORAL | Status: AC
  Administered 2022-07-21: 12.5 mg via ORAL
  Filled 2022-07-20: qty 1

## 2022-07-20 MED ORDER — PLASMA-LYTE A IV SOLN
INTRAVENOUS | Status: DC
  Filled 2022-07-20 (×2): qty 2.5

## 2022-07-20 MED ORDER — NITROGLYCERIN IN D5W 200-5 MCG/ML-% IV SOLN
2.0000 ug/min | INTRAVENOUS | Status: AC
  Administered 2022-07-21: 16.6 ug/min via INTRAVENOUS
  Filled 2022-07-20: qty 250

## 2022-07-20 MED ORDER — TRANEXAMIC ACID 1000 MG/10ML IV SOLN
1.5000 mg/kg/h | INTRAVENOUS | Status: AC
  Administered 2022-07-21: 1.5 mg/kg/h via INTRAVENOUS
  Filled 2022-07-20: qty 25

## 2022-07-20 MED ORDER — PHENYLEPHRINE HCL-NACL 20-0.9 MG/250ML-% IV SOLN
30.0000 ug/min | INTRAVENOUS | Status: AC
  Administered 2022-07-21: 20 ug/min via INTRAVENOUS
  Filled 2022-07-20: qty 250

## 2022-07-20 MED ORDER — CHLORHEXIDINE GLUCONATE 0.12 % MT SOLN
15.0000 mL | Freq: Once | OROMUCOSAL | Status: AC
  Administered 2022-07-21: 15 mL via OROMUCOSAL
  Filled 2022-07-20: qty 15

## 2022-07-20 MED ORDER — INSULIN ASPART 100 UNIT/ML IJ SOLN
10.0000 [IU] | Freq: Three times a day (TID) | INTRAMUSCULAR | Status: DC
  Administered 2022-07-20 (×2): 10 [IU] via SUBCUTANEOUS

## 2022-07-20 MED ORDER — CEFAZOLIN SODIUM-DEXTROSE 2-4 GM/100ML-% IV SOLN
2.0000 g | INTRAVENOUS | Status: AC
  Administered 2022-07-21 (×2): 2 g via INTRAVENOUS
  Filled 2022-07-20: qty 100

## 2022-07-20 MED ORDER — POLYETHYLENE GLYCOL 3350 17 G PO PACK
17.0000 g | PACK | Freq: Once | ORAL | Status: AC
  Administered 2022-07-20: 17 g via ORAL
  Filled 2022-07-20: qty 1

## 2022-07-20 MED ORDER — DIAZEPAM 2 MG PO TABS
5.0000 mg | ORAL_TABLET | Freq: Once | ORAL | Status: AC
  Administered 2022-07-21: 5 mg via ORAL
  Filled 2022-07-20: qty 3

## 2022-07-20 MED ORDER — CHLORHEXIDINE GLUCONATE CLOTH 2 % EX PADS
6.0000 | MEDICATED_PAD | Freq: Once | CUTANEOUS | Status: AC
  Administered 2022-07-20: 6 via TOPICAL

## 2022-07-20 MED ORDER — TEMAZEPAM 15 MG PO CAPS: 15.0000 mg | ORAL_CAPSULE | Freq: Once | ORAL | Status: DC | PRN

## 2022-07-20 MED ORDER — INSULIN REGULAR(HUMAN) IN NACL 100-0.9 UT/100ML-% IV SOLN
INTRAVENOUS | Status: AC
  Administered 2022-07-21: 8.5 [IU]/h via INTRAVENOUS
  Filled 2022-07-20: qty 100

## 2022-07-20 MED ORDER — DEXMEDETOMIDINE HCL IN NACL 400 MCG/100ML IV SOLN
0.1000 ug/kg/h | INTRAVENOUS | Status: AC
  Administered 2022-07-21: .5 ug/kg/h via INTRAVENOUS
  Filled 2022-07-20: qty 100

## 2022-07-20 MED ORDER — TRANEXAMIC ACID (OHS) PUMP PRIME SOLUTION
2.0000 mg/kg | INTRAVENOUS | Status: DC
  Filled 2022-07-20: qty 2.02

## 2022-07-20 MED ORDER — VANCOMYCIN HCL 1500 MG/300ML IV SOLN
1500.0000 mg | INTRAVENOUS | Status: AC
  Administered 2022-07-21: 1500 mg via INTRAVENOUS
  Filled 2022-07-20: qty 300

## 2022-07-20 MED ORDER — BISACODYL 5 MG PO TBEC
5.0000 mg | DELAYED_RELEASE_TABLET | Freq: Once | ORAL | Status: AC
  Administered 2022-07-20: 5 mg via ORAL
  Filled 2022-07-20: qty 1

## 2022-07-20 MED ORDER — HEPARIN 30,000 UNITS/1000 ML (OHS) CELLSAVER SOLUTION
Status: DC
  Filled 2022-07-20: qty 1000

## 2022-07-20 MED ORDER — POTASSIUM CHLORIDE 2 MEQ/ML IV SOLN
80.0000 meq | INTRAVENOUS | Status: DC
  Filled 2022-07-20: qty 40

## 2022-07-20 MED ORDER — MILRINONE LACTATE IN DEXTROSE 20-5 MG/100ML-% IV SOLN
0.3000 ug/kg/min | INTRAVENOUS | Status: DC
  Filled 2022-07-20: qty 100

## 2022-07-20 MED ORDER — NOREPINEPHRINE 4 MG/250ML-% IV SOLN
0.0000 ug/min | INTRAVENOUS | Status: DC
  Filled 2022-07-20: qty 250

## 2022-07-20 NOTE — Anesthesia Preprocedure Evaluation (Signed)
Anesthesia Evaluation  Patient identified by MRN, date of birth, ID band Patient awake    Reviewed: Allergy & Precautions, NPO status , Patient's Chart, lab work & pertinent test results  History of Anesthesia Complications Negative for: history of anesthetic complications  Airway Mallampati: II  TM Distance: >3 FB Neck ROM: Full    Dental  (+) Missing, Chipped, Dental Advisory Given   Pulmonary neg pulmonary ROS   breath sounds clear to auscultation       Cardiovascular (-) angina + CAD and + Past MI (NSTEMI)   Rhythm:Regular Rate:Normal  07/14/2022 CATH: EF 45-50%, severe 3v ASCAD  07/14/2022 ECHO: EF 50-55%, low normal LVF, severe basal inferior hypokinesis and entire inferolateral wall, mild LVH, normal RVF, mod elevated PAP, mild MR, aortic valve sclerosis without stenosis   Neuro/Psych   Anxiety Depression    negative neurological ROS     GI/Hepatic negative GI ROS, Neg liver ROS,,,  Endo/Other  diabetes (glu 175), Oral Hypoglycemic AgentsHypothyroidism  BMI 34  Renal/GU negative Renal ROS     Musculoskeletal   Abdominal   Peds  Hematology Hb 12.6, plt 218k   Anesthesia Other Findings   Reproductive/Obstetrics                             Anesthesia Physical Anesthesia Plan  ASA: 4  Anesthesia Plan: General   Post-op Pain Management:    Induction:   PONV Risk Score and Plan: 3 and Treatment may vary due to age or medical condition  Airway Management Planned: Oral ETT  Additional Equipment: Arterial line, PA Cath, TEE and Ultrasound Guidance Line Placement  Intra-op Plan:   Post-operative Plan: Post-operative intubation/ventilation  Informed Consent: I have reviewed the patients History and Physical, chart, labs and discussed the procedure including the risks, benefits and alternatives for the proposed anesthesia with the patient or authorized representative who has  indicated his/her understanding and acceptance.     Dental advisory given  Plan Discussed with: CRNA and Surgeon  Anesthesia Plan Comments:        Anesthesia Quick Evaluation

## 2022-07-20 NOTE — Progress Notes (Signed)
Rounding Note    Patient Name: Alisha Williams Date of Encounter: 07/20/2022  [facility] Cardiologist: Dr Jens Som  Subjective   Labs stable today- sodium improving. Notes improvement in bladder pressure/dysuria. Feels constipated - no BM in several days.  Inpatient Medications    Scheduled Meds:  aspirin EC  81 mg Oral Daily   atorvastatin  80 mg Oral Daily   bisacodyl  5 mg Oral Once   cephALEXin  500 mg Oral Q12H   Chlorhexidine Gluconate Cloth  6 each Topical Once   And   Chlorhexidine Gluconate Cloth  6 each Topical Once   ezetimibe  10 mg Oral Daily   insulin aspart  0-15 Units Subcutaneous TID WC   insulin aspart  0-5 Units Subcutaneous QHS   insulin aspart  10 Units Subcutaneous TID WC   insulin glargine-yfgn  35 Units Subcutaneous BID   levothyroxine  125 mcg Oral Daily   metoprolol tartrate  12.5 mg Oral BID   sodium chloride flush  3 mL Intravenous Q12H   Continuous Infusions:  sodium chloride     heparin 1,700 Units/hr (07/20/22 0701)   PRN Meds: sodium chloride, acetaminophen, acetaminophen, dextrose, nitroGLYCERIN, ondansetron (ZOFRAN) IV, ondansetron (ZOFRAN) IV, sodium chloride flush, temazepam   Vital Signs    Vitals:   07/19/22 2338 07/20/22 0320 07/20/22 0334 07/20/22 0825  BP: 116/64  112/61 115/78  Pulse: 76  69 84  Resp: 19  16   Temp: 98.5 F (36.9 C)  98.3 F (36.8 C) 98.6 F (37 C)  TempSrc: Oral  Oral Oral  SpO2: 96%  98% 95%  Weight:  100.8 kg    Height:        Intake/Output Summary (Last 24 hours) at 07/20/2022 0951 Last data filed at 07/20/2022 0834 Gross per 24 hour  Intake 748.12 ml  Output 200 ml  Net 548.12 ml      07/20/2022    3:20 AM 07/19/2022    6:28 AM 07/18/2022    6:45 AM  Last 3 Weights  Weight (lbs) 222 lb 3.6 oz 219 lb 2.2 oz 211 lb 6.7 oz  Weight (kg) 100.8 kg 99.4 kg 95.9 kg      Telemetry    Sinus rhythm - Personally Reviewed  Physical Exam   GEN: NAD Neck: No JVD, supple Cardiac:  RRR Respiratory: CTA GI: Soft, NT/ND MS: No edema; radial cath site with no hematoma Neuro: Grossly intact Psych: Normal affect   Labs    High Sensitivity Troponin:   Recent Labs  Lab 07/13/22 2245 07/14/22 0000  TROPONINIHS 3,215* 2,338*     Chemistry Recent Labs  Lab 07/13/22 2245 07/14/22 0749 07/14/22 1227 07/17/22 0039 07/19/22 0021 07/20/22 0014  NA 136 133*   < > 130* 132* 133*  K 3.7 3.7   < > 3.6 3.8 4.0  CL 98 101   < > 98 100 101  CO2 19* 22   < > 16* 22 22  GLUCOSE 206* 146*   < > 289* 215* 228*  BUN 13 13   < > 24* 15 12  CREATININE 0.70 0.55   < > 0.62 0.68 0.58  CALCIUM 8.6* 8.1*   < > 8.8* 9.0 8.7*  MG  --  2.1  --   --   --   --   PROT 6.7  --   --   --   --   --   ALBUMIN 3.5  --   --   --   --   --  AST 31  --   --   --   --   --   ALT 21  --   --   --   --   --   ALKPHOS 47  --   --   --   --   --   BILITOT 1.7*  --   --   --   --   --   GFRNONAA >60 >60   < > >60 >60 >60  ANIONGAP 19* 10   < > 16* 10 10   < > = values in this interval not displayed.    Lipids  Recent Labs  Lab 07/14/22 0749  CHOL 223*  TRIG 442*  HDL 35*  LDLCALC UNABLE TO CALCULATE IF TRIGLYCERIDE OVER 400 mg/dL  CHOLHDL 6.4    Hematology Recent Labs  Lab 07/16/22 0020 07/17/22 0039 07/18/22 0024  WBC 7.7 8.0 6.6  RBC 4.92 4.83 4.87  HGB 13.1 13.1 12.9  HCT 41.9 40.0 40.6  MCV 85.2 82.8 83.4  MCH 26.6 27.1 26.5  MCHC 31.3 32.8 31.8  RDW 13.4 13.2 13.4  PLT 230 274 238   Thyroid  Recent Labs  Lab 07/14/22 1227  TSH 5.031*    BNP Recent Labs  Lab 07/14/22 0743  BNP 217.7*     Radiology    No results found.    Patient Profile     67 y.o. female with past medical history of diabetes mellitus, hypothyroidism admitted with non-ST elevation myocardial infarction.  Cardiac catheterization revealed severe three-vessel coronary artery disease, ejection fraction 45 to 50% and elevated left ventricular end-diastolic pressure.  Echocardiogram shows  ejection fraction 50 to 55%, mild left ventricular hypertrophy, hypokinesis of the basal inferior wall and inferolateral wall, mild mitral regurgitation.  Assessment & Plan    1 non-ST elevation myocardial infarction-patient remains pain-free.  Continue aspirin, heparin, statin and metoprolol.  Cardiac catheterization reveals severe three-vessel coronary artery disease and echocardiogram shows preserved LV function. Seen by Dr Dorris Fetch and CABG scheduled for tomorrow - will follow-up after.  2 acute diastolic congestive heart failure-left ventricular end-diastolic pressure elevated at time of cardiac catheterization.  She is symptomatically improved and appears euvolemic. Sodium is trending down, but creatinine stable. Stopped lasix - creatinine stable, sodium improving. Appears euvolemic.  3 diabetes mellitus-continue present regimen.  Follow CBGs. Appreciate diabetes coordinator recommendations, have made suggested adjustments.  4. Dyslipidemia - noted to have high LP(a) of 238.2 nmol/L. TC 223, TG 442, HDL 35, dLDL 124.  On atorva 80 and ezetimibe. Good candidate to add PCSK9i at follow-up - with target LDL <55 if achievable.  5. Bladder pressure - has noted cloudy urine and pressure around the bladder -Urinalysis shows hazy appearance, large hemoglobin, trace leukocytes, rare bacteria and yeast. Given upcoming surgery, will favor treatment (D/w Dr. Ella Jubilee, hospital medicine), 3 day course of cephalexin 500 mg BID.  6. Constipation - will give miralax today.  They would like to follow-up in [place] if possible.  For questions or updates, please contact [facility] Please consult [email or web address] for contact info under   Chrystie Nose, MD, Milagros Loll  [place]  [facility]  Medical Director of the Advanced Lipid Disorders &  Cardiovascular Risk Reduction Clinic Diplomate of the American Board of Clinical Lipidology Attending Cardiologist  Direct Dial: [phone]  Fax:  [phone]  Website:  [email or web address]  Chrystie Nose, MD  07/20/2022, 9:51 AM

## 2022-07-20 NOTE — Progress Notes (Addendum)
ANTICOAGULATION CONSULT NOTE   Pharmacy Consult for heparin Indication: chest pain/ACS  Allergies  Allergen Reactions   Metformin And Related Other (See Comments)    Weakness & fatigue    Patient Measurements: Height: 5\' 8"  (172.7 cm) Weight: 100.8 kg (222 lb 3.6 oz) IBW/kg (Calculated) : 63.9 Heparin Dosing Weight: 86 kg  Vital Signs: Temp: 98.3 F (36.8 C) (06/25 0334) Temp Source: Oral (06/25 0334) BP: 112/61 (06/25 0334) Pulse Rate: 69 (06/25 0334)  Labs: Recent Labs    07/18/22 0024 07/19/22 0021 07/20/22 0014  HGB 12.9  --   --   HCT 40.6  --   --   PLT 238  --   --   HEPARINUNFRC 0.50 0.50 0.60  CREATININE  --  0.68 0.58     Estimated Creatinine Clearance: 87.1 mL/min (by C-G formula based on SCr of 0.58 mg/dL).   Medical History: Past Medical History:  Diagnosis Date   Anemia    nos   Anxiety    Depression    Diabetes mellitus without complication (HCC)    Hypothyroidism     Assessment: 67 yo W transferred from [facility] ED for chest pain and troponin elevation. . Patient is s/p cath with severe 3 vessel disease.   CABG planned for 6/26  Heparin level remains therapeutic   Goal of Therapy:  Heparin level 0.3-0.7 units/ml Monitor platelets by anticoagulation protocol: Yes   Plan:  Heparin stop time in place for surgery Monitor daily heparin level, CBC Monitor for signs/symptoms of bleeding   Thank you  Okey Regal, PharmD  07/20/2022 7:56 AM

## 2022-07-20 NOTE — TOC Progression Note (Signed)
Transition of Care [facility]) - Progression Note    Patient Details  Name: Alisha Williams MRN: [id number] Date of Birth: 1955/07/03  [facility]) CM/SW Contact  Kermit Balo, RN Phone Number: 07/20/2022, 2:14 PM  Clinical Narrative:    Plan is for CABG tomorrow.  TOC following for d/c needs.    Expected Discharge Plan: Home/Self Care    Expected Discharge Plan and Services                                               Social Determinants of Health (SDOH) Interventions SDOH Screenings   Food Insecurity: No Food Insecurity (07/13/2022)  Housing: Low Risk  (07/13/2022)  Transportation Needs: No Transportation Needs (07/13/2022)  Utilities: Not At Risk (07/13/2022)  Tobacco Use: Unknown (07/18/2022)    Readmission Risk Interventions     No data to display

## 2022-07-20 NOTE — Progress Notes (Signed)
6 Days Post-Op Procedure(s) (LRB): LEFT HEART CATH AND CORONARY ANGIOGRAPHY (N/A) Subjective: No complaints. Husband is lying in bed with her.  Expressed concerns about timing of surgery and exposure of her body.  Objective: Vital signs in last 24 hours: Temp:  [98.3 F (36.8 C)-98.6 F (37 C)] 98.6 F (37 C) (06/25 0825) Pulse Rate:  [69-90] 84 (06/25 1540) Cardiac Rhythm: Normal sinus rhythm (06/25 0800) Resp:  [14-19] 14 (06/25 1540) BP: (100-116)/(61-87) 100/69 (06/25 1540) SpO2:  [95 %-98 %] 95 % (06/25 0825) Weight:  [100.8 kg] 100.8 kg (06/25 0320)  Hemodynamic parameters for last 24 hours:    Intake/Output from previous day: 06/24 0701 - 06/25 0700 In: 1137.7 [P.O.:360; I.V.:777.7] Out: 200 [Urine:200] Intake/Output this shift: Total I/O In: 370.8 [P.O.:120; I.V.:250.8] Out: -   General appearance: alert, cooperative, and no distress Neurologic: intact Heart: regular rate and rhythm Lungs: diminished breath sounds bibasilar  Lab Results: Recent Labs    07/18/22 0024  WBC 6.6  HGB 12.9  HCT 40.6  PLT 238   BMET:  Recent Labs    07/19/22 0021 07/20/22 0014  NA 132* 133*  K 3.8 4.0  CL 100 101  CO2 22 22  GLUCOSE 215* 228*  BUN 15 12  CREATININE 0.68 0.58  CALCIUM 9.0 8.7*    PT/INR: No results for input(s): "LABPROT", "INR" in the last 72 hours. ABG    Component Value Date/Time   HCO3 27.2 07/14/2022 0749   TCO2 26 01/13/2022 1613   O2SAT 57.5 07/14/2022 0749   CBG (last 3)  Recent Labs    07/20/22 0605 07/20/22 1150 07/20/22 1538  GLUCAP 204* 235* 195*    Assessment/Plan: S/P Procedure(s) (LRB): LEFT HEART CATH AND CORONARY ANGIOGRAPHY (N/A) Severe three-vessel coronary disease presented with non-ST elevation MI.  I discussed timing of surgery with the patient and her husband.  Their anniversary is Thursday and they would prefer surgery on Friday but unfortunately my schedule is already booked for Friday and I am unable to  accommodate that.  I offered the option of discharge with medical therapy and return for surgery at a later date.  After discussion they decided they would prefer to proceed with surgery tomorrow.  Her husband had expressed concerns about exposure of her body during the procedure.  I tried to reassure him that this is purely clinical in nature.  She is aware of the indications, risk, benefits, and alternatives.  She understands the risks include, but not limited to death, MI, DVT, PE, stroke, bleeding, possible need for transfusion, cardiac arrhythmias, as well as respiratory, renal, or gastrointestinal complications.  She accepts the risk and agrees to proceed.  Plan surgery tomorrow.   LOS: 7 days    Loreli Slot 07/20/2022

## 2022-07-21 ENCOUNTER — Inpatient Hospital Stay (HOSPITAL_COMMUNITY): Payer: 59 | Admitting: Anesthesiology

## 2022-07-21 ENCOUNTER — Inpatient Hospital Stay (HOSPITAL_COMMUNITY): Payer: 59

## 2022-07-21 ENCOUNTER — Other Ambulatory Visit: Payer: Self-pay

## 2022-07-21 ENCOUNTER — Other Ambulatory Visit (HOSPITAL_COMMUNITY): Payer: Self-pay

## 2022-07-21 ENCOUNTER — Inpatient Hospital Stay (HOSPITAL_COMMUNITY)
Admission: EM | Disposition: A | Discharge status: Home / Self Care | Payer: Self-pay | Source: Other Acute Inpatient Hospital | Attending: Internal Medicine

## 2022-07-21 DIAGNOSIS — F418 Other specified anxiety disorders: Secondary | ICD-10-CM

## 2022-07-21 DIAGNOSIS — Z951 Presence of aortocoronary bypass graft: Secondary | ICD-10-CM

## 2022-07-21 DIAGNOSIS — E119 Type 2 diabetes mellitus without complications: Secondary | ICD-10-CM

## 2022-07-21 DIAGNOSIS — I252 Old myocardial infarction: Secondary | ICD-10-CM

## 2022-07-21 DIAGNOSIS — E039 Hypothyroidism, unspecified: Secondary | ICD-10-CM

## 2022-07-21 DIAGNOSIS — Z7984 Long term (current) use of oral hypoglycemic drugs: Secondary | ICD-10-CM

## 2022-07-21 DIAGNOSIS — I2510 Atherosclerotic heart disease of native coronary artery without angina pectoris: Secondary | ICD-10-CM

## 2022-07-21 HISTORY — PX: ENDOVEIN HARVEST OF GREATER SAPHENOUS VEIN: SHX5059

## 2022-07-21 HISTORY — PX: TEE WITHOUT CARDIOVERSION: SHX5443

## 2022-07-21 HISTORY — PX: CORONARY ARTERY BYPASS GRAFT: SHX141

## 2022-07-21 LAB — BASIC METABOLIC PANEL
Anion gap: 11 (ref 5–15)
Anion gap: 11 (ref 5–15)
Anion gap: 8 (ref 5–15)
BUN: 10 mg/dL (ref 8–23)
BUN: 6 mg/dL — ABNORMAL LOW (ref 8–23)
BUN: <5 mg/dL — ABNORMAL LOW (ref 8–23)
CO2: 21 mmol/L — ABNORMAL LOW (ref 22–32)
CO2: 22 mmol/L (ref 22–32)
CO2: 23 mmol/L (ref 22–32)
Calcium: 7.9 mg/dL — ABNORMAL LOW (ref 8.9–10.3)
Calcium: 9 mg/dL (ref 8.9–10.3)
Calcium: 9.1 mg/dL (ref 8.9–10.3)
Chloride: 100 mmol/L (ref 98–111)
Chloride: 101 mmol/L (ref 98–111)
Chloride: 106 mmol/L (ref 98–111)
Creatinine, Ser: 0.49 mg/dL (ref 0.44–1.00)
Creatinine, Ser: 0.53 mg/dL (ref 0.44–1.00)
Creatinine, Ser: 0.55 mg/dL (ref 0.44–1.00)
GFR, Estimated: >60 mL/min (ref >60)
GFR, Estimated: >60 mL/min (ref >60)
GFR, Estimated: >60 mL/min (ref >60)
Glucose, Bld: 133 mg/dL — ABNORMAL HIGH (ref 70–99)
Glucose, Bld: 158 mg/dL — ABNORMAL HIGH (ref 70–99)
Glucose, Bld: 186 mg/dL — ABNORMAL HIGH (ref 70–99)
Potassium: 3.8 mmol/L (ref 3.5–5.1)
Potassium: 4.1 mmol/L (ref 3.5–5.1)
Potassium: 4.1 mmol/L (ref 3.5–5.1)
Sodium: 134 mmol/L — ABNORMAL LOW (ref 135–145)
Sodium: 134 mmol/L — ABNORMAL LOW (ref 135–145)
Sodium: 135 mmol/L (ref 135–145)

## 2022-07-21 LAB — POCT I-STAT, CHEM 8
BUN: 8 mg/dL (ref 8–23)
BUN: 8 mg/dL (ref 8–23)
BUN: 7 mg/dL — ABNORMAL LOW (ref 8–23)
BUN: 6 mg/dL — ABNORMAL LOW (ref 8–23)
BUN: 6 mg/dL — ABNORMAL LOW (ref 8–23)
Calcium, Ion: 1.21 mmol/L (ref 1.15–1.40)
Calcium, Ion: 1.21 mmol/L (ref 1.15–1.40)
Calcium, Ion: 1.08 mmol/L — ABNORMAL LOW (ref 1.15–1.40)
Calcium, Ion: 1.03 mmol/L — ABNORMAL LOW (ref 1.15–1.40)
Calcium, Ion: 1.33 mmol/L (ref 1.15–1.40)
Chloride: 102 mmol/L (ref 98–111)
Chloride: 102 mmol/L (ref 98–111)
Chloride: 101 mmol/L (ref 98–111)
Chloride: 100 mmol/L (ref 98–111)
Chloride: 103 mmol/L (ref 98–111)
Creatinine, Ser: 0.3 mg/dL — ABNORMAL LOW (ref 0.44–1.00)
Creatinine, Ser: 0.4 mg/dL — ABNORMAL LOW (ref 0.44–1.00)
Creatinine, Ser: 0.4 mg/dL — ABNORMAL LOW (ref 0.44–1.00)
Creatinine, Ser: 0.3 mg/dL — ABNORMAL LOW (ref 0.44–1.00)
Creatinine, Ser: 0.4 mg/dL — ABNORMAL LOW (ref 0.44–1.00)
Glucose, Bld: 202 mg/dL — ABNORMAL HIGH (ref 70–99)
Glucose, Bld: 174 mg/dL — ABNORMAL HIGH (ref 70–99)
Glucose, Bld: 148 mg/dL — ABNORMAL HIGH (ref 70–99)
Glucose, Bld: 150 mg/dL — ABNORMAL HIGH (ref 70–99)
Glucose, Bld: 157 mg/dL — ABNORMAL HIGH (ref 70–99)
HCT: 34 % — ABNORMAL LOW (ref 36.0–46.0)
HCT: 35 % — ABNORMAL LOW (ref 36.0–46.0)
HCT: 27 % — ABNORMAL LOW (ref 36.0–46.0)
HCT: 26 % — ABNORMAL LOW (ref 36.0–46.0)
HCT: 28 % — ABNORMAL LOW (ref 36.0–46.0)
Hemoglobin: 11.6 g/dL — ABNORMAL LOW (ref 12.0–15.0)
Hemoglobin: 11.9 g/dL — ABNORMAL LOW (ref 12.0–15.0)
Hemoglobin: 9.2 g/dL — ABNORMAL LOW (ref 12.0–15.0)
Hemoglobin: 8.8 g/dL — ABNORMAL LOW (ref 12.0–15.0)
Hemoglobin: 9.5 g/dL — ABNORMAL LOW (ref 12.0–15.0)
Potassium: 4.1 mmol/L (ref 3.5–5.1)
Potassium: 3.8 mmol/L (ref 3.5–5.1)
Potassium: 4.6 mmol/L (ref 3.5–5.1)
Potassium: 3.9 mmol/L (ref 3.5–5.1)
Potassium: 4.1 mmol/L (ref 3.5–5.1)
Sodium: 136 mmol/L (ref 135–145)
Sodium: 135 mmol/L (ref 135–145)
Sodium: 136 mmol/L (ref 135–145)
Sodium: 135 mmol/L (ref 135–145)
Sodium: 136 mmol/L (ref 135–145)
TCO2: 26 mmol/L (ref 22–32)
TCO2: 26 mmol/L (ref 22–32)
TCO2: 25 mmol/L (ref 22–32)
TCO2: 25 mmol/L (ref 22–32)
TCO2: 25 mmol/L (ref 22–32)

## 2022-07-21 LAB — BPAM RBC
ISSUE DATE / TIME: 202406260850
Unit Type and Rh: 5100
Unit Type and Rh: 5100

## 2022-07-21 LAB — CBC
HCT: 32.3 % — ABNORMAL LOW (ref 36.0–46.0)
HCT: 33.8 % — ABNORMAL LOW (ref 36.0–46.0)
HCT: 35.3 % — ABNORMAL LOW (ref 36.0–46.0)
HCT: 39.7 % (ref 36.0–46.0)
Hemoglobin: 10 g/dL — ABNORMAL LOW (ref 12.0–15.0)
Hemoglobin: 10.5 g/dL — ABNORMAL LOW (ref 12.0–15.0)
Hemoglobin: 11.1 g/dL — ABNORMAL LOW (ref 12.0–15.0)
Hemoglobin: 12.6 g/dL (ref 12.0–15.0)
MCH: 26.1 pg (ref 26.0–34.0)
MCH: 26.2 pg (ref 26.0–34.0)
MCH: 26.3 pg (ref 26.0–34.0)
MCH: 26.6 pg (ref 26.0–34.0)
MCHC: 31 g/dL (ref 30.0–36.0)
MCHC: 31.1 g/dL (ref 30.0–36.0)
MCHC: 31.4 g/dL (ref 30.0–36.0)
MCHC: 31.7 g/dL (ref 30.0–36.0)
MCV: 82.9 fL (ref 80.0–100.0)
MCV: 84.1 fL (ref 80.0–100.0)
MCV: 84.4 fL (ref 80.0–100.0)
MCV: 84.6 fL (ref 80.0–100.0)
Platelets: 205 10*3/uL (ref 150–400)
Platelets: 210 10*3/uL (ref 150–400)
Platelets: 214 10*3/uL (ref 150–400)
Platelets: 218 K/uL (ref 150–400)
RBC: 3.82 MIL/uL — ABNORMAL LOW (ref 3.87–5.11)
RBC: 4.02 MIL/uL (ref 3.87–5.11)
RBC: 4.18 MIL/uL (ref 3.87–5.11)
RBC: 4.79 MIL/uL (ref 3.87–5.11)
RDW: 13.7 % (ref 11.5–15.5)
RDW: 14 % (ref 11.5–15.5)
RDW: 14 % (ref 11.5–15.5)
RDW: 14 % (ref 11.5–15.5)
WBC: 10.9 10*3/uL — ABNORMAL HIGH (ref 4.0–10.5)
WBC: 6.5 K/uL (ref 4.0–10.5)
WBC: 8.6 10*3/uL (ref 4.0–10.5)
WBC: 9.3 10*3/uL (ref 4.0–10.5)
nRBC: 0 % (ref 0.0–0.2)
nRBC: 0 % (ref 0.0–0.2)
nRBC: 0 % (ref 0.0–0.2)
nRBC: 0 % (ref 0.0–0.2)

## 2022-07-21 LAB — POCT I-STAT 7, (LYTES, BLD GAS, ICA,H+H)
Acid-base deficit: 3 mmol/L — ABNORMAL HIGH (ref 0.0–2.0)
Acid-base deficit: 1 mmol/L (ref 0.0–2.0)
Acid-base deficit: 4 mmol/L — ABNORMAL HIGH (ref 0.0–2.0)
Acid-base deficit: 3 mmol/L — ABNORMAL HIGH (ref 0.0–2.0)
Acid-base deficit: 4 mmol/L — ABNORMAL HIGH (ref 0.0–2.0)
Bicarbonate: 22.1 mmol/L (ref 20.0–28.0)
Bicarbonate: 24.2 mmol/L (ref 20.0–28.0)
Bicarbonate: 22.2 mmol/L (ref 20.0–28.0)
Bicarbonate: 22.7 mmol/L (ref 20.0–28.0)
Bicarbonate: 21.3 mmol/L (ref 20.0–28.0)
Calcium, Ion: 1.01 mmol/L — ABNORMAL LOW (ref 1.15–1.40)
Calcium, Ion: 1.05 mmol/L — ABNORMAL LOW (ref 1.15–1.40)
Calcium, Ion: 1.29 mmol/L (ref 1.15–1.40)
Calcium, Ion: 1.18 mmol/L (ref 1.15–1.40)
Calcium, Ion: 1.36 mmol/L (ref 1.15–1.40)
HCT: 27 % — ABNORMAL LOW (ref 36.0–46.0)
HCT: 27 % — ABNORMAL LOW (ref 36.0–46.0)
HCT: 34 % — ABNORMAL LOW (ref 36.0–46.0)
HCT: 33 % — ABNORMAL LOW (ref 36.0–46.0)
HCT: 33 % — ABNORMAL LOW (ref 36.0–46.0)
Hemoglobin: 9.2 g/dL — ABNORMAL LOW (ref 12.0–15.0)
Hemoglobin: 9.2 g/dL — ABNORMAL LOW (ref 12.0–15.0)
Hemoglobin: 11.6 g/dL — ABNORMAL LOW (ref 12.0–15.0)
Hemoglobin: 11.2 g/dL — ABNORMAL LOW (ref 12.0–15.0)
Hemoglobin: 11.2 g/dL — ABNORMAL LOW (ref 12.0–15.0)
O2 Saturation: 100 %
O2 Saturation: 100 %
O2 Saturation: 97 %
O2 Saturation: 97 %
O2 Saturation: 98 %
Patient temperature: 36.7
Patient temperature: 37.8
Patient temperature: 38
Potassium: 3.5 mmol/L (ref 3.5–5.1)
Potassium: 4.6 mmol/L (ref 3.5–5.1)
Potassium: 4.1 mmol/L (ref 3.5–5.1)
Potassium: 4.2 mmol/L (ref 3.5–5.1)
Potassium: 4.2 mmol/L (ref 3.5–5.1)
Sodium: 137 mmol/L (ref 135–145)
Sodium: 136 mmol/L (ref 135–145)
Sodium: 136 mmol/L (ref 135–145)
Sodium: 138 mmol/L (ref 135–145)
Sodium: 138 mmol/L (ref 135–145)
TCO2: 23 mmol/L (ref 22–32)
TCO2: 25 mmol/L (ref 22–32)
TCO2: 24 mmol/L (ref 22–32)
TCO2: 24 mmol/L (ref 22–32)
TCO2: 23 mmol/L (ref 22–32)
pCO2 arterial: 40.1 mmHg (ref 32–48)
pCO2 arterial: 41.2 mmHg (ref 32–48)
pCO2 arterial: 44 mmHg (ref 32–48)
pCO2 arterial: 41.7 mmHg (ref 32–48)
pCO2 arterial: 42.3 mmHg (ref 32–48)
pH, Arterial: 7.35 (ref 7.35–7.45)
pH, Arterial: 7.377 (ref 7.35–7.45)
pH, Arterial: 7.311 — ABNORMAL LOW (ref 7.35–7.45)
pH, Arterial: 7.348 — ABNORMAL LOW (ref 7.35–7.45)
pH, Arterial: 7.315 — ABNORMAL LOW (ref 7.35–7.45)
pO2, Arterial: 310 mmHg — ABNORMAL HIGH (ref 83–108)
pO2, Arterial: 277 mmHg — ABNORMAL HIGH (ref 83–108)
pO2, Arterial: 95 mmHg (ref 83–108)
pO2, Arterial: 101 mmHg (ref 83–108)
pO2, Arterial: 124 mmHg — ABNORMAL HIGH (ref 83–108)

## 2022-07-21 LAB — POCT I-STAT EG7
Acid-base deficit: 3 mmol/L — ABNORMAL HIGH (ref 0.0–2.0)
Bicarbonate: 22.8 mmol/L (ref 20.0–28.0)
Calcium, Ion: 1.06 mmol/L — ABNORMAL LOW (ref 1.15–1.40)
HCT: 28 % — ABNORMAL LOW (ref 36.0–46.0)
Hemoglobin: 9.5 g/dL — ABNORMAL LOW (ref 12.0–15.0)
O2 Saturation: 78 %
Potassium: 3.4 mmol/L — ABNORMAL LOW (ref 3.5–5.1)
Sodium: 137 mmol/L (ref 135–145)
TCO2: 24 mmol/L (ref 22–32)
pCO2, Ven: 44.2 mmHg (ref 44–60)
pH, Ven: 7.32 (ref 7.25–7.43)
pO2, Ven: 46 mmHg — ABNORMAL HIGH (ref 32–45)

## 2022-07-21 LAB — TYPE AND SCREEN

## 2022-07-21 LAB — GLUCOSE, CAPILLARY
Glucose-Capillary: 175 mg/dL — ABNORMAL HIGH (ref 70–99)
Glucose-Capillary: 149 mg/dL — ABNORMAL HIGH (ref 70–99)
Glucose-Capillary: 117 mg/dL — ABNORMAL HIGH (ref 70–99)
Glucose-Capillary: 139 mg/dL — ABNORMAL HIGH (ref 70–99)
Glucose-Capillary: 128 mg/dL — ABNORMAL HIGH (ref 70–99)
Glucose-Capillary: 134 mg/dL — ABNORMAL HIGH (ref 70–99)
Glucose-Capillary: 135 mg/dL — ABNORMAL HIGH (ref 70–99)
Glucose-Capillary: 163 mg/dL — ABNORMAL HIGH (ref 70–99)
Glucose-Capillary: 156 mg/dL — ABNORMAL HIGH (ref 70–99)
Glucose-Capillary: 142 mg/dL — ABNORMAL HIGH (ref 70–99)
Glucose-Capillary: 121 mg/dL — ABNORMAL HIGH (ref 70–99)

## 2022-07-21 LAB — HEMOGLOBIN AND HEMATOCRIT, BLOOD
HCT: 26.6 % — ABNORMAL LOW (ref 36.0–46.0)
Hemoglobin: 8.4 g/dL — ABNORMAL LOW (ref 12.0–15.0)

## 2022-07-21 LAB — APTT: aPTT: 31 seconds (ref 24–36)

## 2022-07-21 LAB — ECHO INTRAOPERATIVE TEE
AV Mean grad: 4 mmHg
Height: 68 in
Weight: 3552.05 [oz_av]

## 2022-07-21 LAB — PLATELET COUNT: Platelets: 164 10*3/uL (ref 150–400)

## 2022-07-21 LAB — PROTIME-INR
INR: 1.3 — ABNORMAL HIGH (ref 0.8–1.2)
Prothrombin Time: 16.3 seconds — ABNORMAL HIGH (ref 11.4–15.2)

## 2022-07-21 LAB — MAGNESIUM
Magnesium: 2.4 mg/dL (ref 1.7–2.4)
Magnesium: 2.6 mg/dL — ABNORMAL HIGH (ref 1.7–2.4)

## 2022-07-21 LAB — PREPARE RBC (CROSSMATCH)

## 2022-07-21 SURGERY — CORONARY ARTERY BYPASS GRAFTING (CABG)
Anesthesia: General | Site: Leg Upper

## 2022-07-21 MED ORDER — LACTATED RINGERS IV SOLN: INTRAVENOUS | Status: DC

## 2022-07-21 MED ORDER — ROCURONIUM BROMIDE 10 MG/ML (PF) SYRINGE
PREFILLED_SYRINGE | INTRAVENOUS | Status: DC | PRN
  Administered 2022-07-21 (×2): 70 mg via INTRAVENOUS
  Administered 2022-07-21: 100 mg via INTRAVENOUS
  Administered 2022-07-21 (×2): 30 mg via INTRAVENOUS

## 2022-07-21 MED ORDER — DOCUSATE SODIUM 100 MG PO CAPS
200.0000 mg | ORAL_CAPSULE | Freq: Every day | ORAL | Status: DC
  Administered 2022-07-22 – 2022-07-26 (×5): 200 mg via ORAL
  Filled 2022-07-21 (×5): qty 2

## 2022-07-21 MED ORDER — FENTANYL CITRATE (PF) 250 MCG/5ML IJ SOLN
INTRAMUSCULAR | Status: AC
  Filled 2022-07-21: qty 5

## 2022-07-21 MED ORDER — LIDOCAINE 2% (20 MG/ML) 5 ML SYRINGE
INTRAMUSCULAR | Status: AC
  Filled 2022-07-21: qty 10

## 2022-07-21 MED ORDER — METOCLOPRAMIDE HCL 5 MG/ML IJ SOLN
10.0000 mg | Freq: Four times a day (QID) | INTRAMUSCULAR | Status: AC
  Administered 2022-07-21 – 2022-07-22 (×6): 10 mg via INTRAVENOUS
  Filled 2022-07-21 (×6): qty 2

## 2022-07-21 MED ORDER — BISACODYL 5 MG PO TBEC
10.0000 mg | DELAYED_RELEASE_TABLET | Freq: Every day | ORAL | Status: DC
  Administered 2022-07-22 – 2022-07-26 (×5): 10 mg via ORAL
  Filled 2022-07-21 (×5): qty 2

## 2022-07-21 MED ORDER — SODIUM CHLORIDE 0.9 % IV SOLN
250.0000 mL | INTRAVENOUS | Status: DC
  Administered 2022-07-22: 250 mL via INTRAVENOUS

## 2022-07-21 MED ORDER — PROPOFOL 10 MG/ML IV BOLUS
INTRAVENOUS | Status: AC
  Filled 2022-07-21: qty 20

## 2022-07-21 MED ORDER — PROPOFOL 10 MG/ML IV BOLUS
INTRAVENOUS | Status: DC | PRN
  Administered 2022-07-21: 20 mg via INTRAVENOUS
  Administered 2022-07-21: 30 mg via INTRAVENOUS

## 2022-07-21 MED ORDER — SODIUM CHLORIDE 0.9% FLUSH: 3.0000 mL | INTRAVENOUS | Status: DC | PRN

## 2022-07-21 MED ORDER — CALCIUM CHLORIDE 10 % IV SOLN
1.0000 g | Freq: Once | INTRAVENOUS | Status: AC
  Administered 2022-07-21: 1 g via INTRAVENOUS

## 2022-07-21 MED ORDER — PHENYLEPHRINE 80 MCG/ML (10ML) SYRINGE FOR IV PUSH (FOR BLOOD PRESSURE SUPPORT)
PREFILLED_SYRINGE | INTRAVENOUS | Status: DC | PRN
  Administered 2022-07-21 (×2): 80 ug via INTRAVENOUS

## 2022-07-21 MED ORDER — PROTAMINE SULFATE 10 MG/ML IV SOLN
INTRAVENOUS | Status: AC
  Filled 2022-07-21: qty 25

## 2022-07-21 MED ORDER — CEFAZOLIN SODIUM-DEXTROSE 2-4 GM/100ML-% IV SOLN
2.0000 g | Freq: Three times a day (TID) | INTRAVENOUS | Status: DC
  Administered 2022-07-21 – 2022-07-23 (×5): 2 g via INTRAVENOUS
  Filled 2022-07-21 (×5): qty 100

## 2022-07-21 MED ORDER — ACETAMINOPHEN 160 MG/5ML PO SOLN
1000.0000 mg | Freq: Four times a day (QID) | ORAL | Status: DC
  Filled 2022-07-21: qty 40.6

## 2022-07-21 MED ORDER — FENTANYL CITRATE (PF) 250 MCG/5ML IJ SOLN
INTRAMUSCULAR | Status: DC | PRN
  Administered 2022-07-21: 675 ug via INTRAVENOUS
  Administered 2022-07-21 (×2): 50 ug via INTRAVENOUS
  Administered 2022-07-21 (×2): 100 ug via INTRAVENOUS
  Administered 2022-07-21: 50 ug via INTRAVENOUS
  Administered 2022-07-21 (×2): 100 ug via INTRAVENOUS
  Administered 2022-07-21: 25 ug via INTRAVENOUS

## 2022-07-21 MED ORDER — DEXMEDETOMIDINE HCL IN NACL 400 MCG/100ML IV SOLN
0.0000 ug/kg/h | INTRAVENOUS | Status: DC
  Administered 2022-07-21: 0.5 ug/kg/h via INTRAVENOUS
  Filled 2022-07-21: qty 100

## 2022-07-21 MED ORDER — PANTOPRAZOLE SODIUM 40 MG PO TBEC
40.0000 mg | DELAYED_RELEASE_TABLET | Freq: Every day | ORAL | Status: DC
  Administered 2022-07-23 – 2022-07-26 (×4): 40 mg via ORAL
  Filled 2022-07-21 (×4): qty 1

## 2022-07-21 MED ORDER — LACTATED RINGERS IV SOLN: INTRAVENOUS | Status: DC | PRN

## 2022-07-21 MED ORDER — BISACODYL 10 MG RE SUPP: 10.0000 mg | Freq: Every day | RECTAL | Status: DC

## 2022-07-21 MED ORDER — MIDAZOLAM HCL (PF) 10 MG/2ML IJ SOLN
INTRAMUSCULAR | Status: AC
  Filled 2022-07-21: qty 2

## 2022-07-21 MED ORDER — INSULIN REGULAR(HUMAN) IN NACL 100-0.9 UT/100ML-% IV SOLN
INTRAVENOUS | Status: DC
  Administered 2022-07-21: 4 [IU]/h via INTRAVENOUS
  Filled 2022-07-21: qty 100

## 2022-07-21 MED ORDER — CHLORHEXIDINE GLUCONATE CLOTH 2 % EX PADS
6.0000 | MEDICATED_PAD | Freq: Every day | CUTANEOUS | Status: DC
  Administered 2022-07-21 – 2022-07-23 (×3): 6 via TOPICAL

## 2022-07-21 MED ORDER — PLASMA-LYTE A IV SOLN: INTRAVENOUS | Status: DC | PRN

## 2022-07-21 MED ORDER — LEVOTHYROXINE SODIUM 25 MCG PO TABS
125.0000 ug | ORAL_TABLET | Freq: Every day | ORAL | Status: DC
  Administered 2022-07-22 – 2022-07-26 (×5): 125 ug via ORAL
  Filled 2022-07-21 (×5): qty 1

## 2022-07-21 MED ORDER — SODIUM CHLORIDE 0.45 % IV SOLN: INTRAVENOUS | Status: DC | PRN

## 2022-07-21 MED ORDER — PHENYLEPHRINE HCL-NACL 20-0.9 MG/250ML-% IV SOLN
0.0000 ug/min | INTRAVENOUS | Status: DC
  Administered 2022-07-21: 50 ug/min via INTRAVENOUS
  Administered 2022-07-21: 20 ug/min via INTRAVENOUS
  Filled 2022-07-21 (×2): qty 250

## 2022-07-21 MED ORDER — ACETAMINOPHEN 500 MG PO TABS
1000.0000 mg | ORAL_TABLET | Freq: Four times a day (QID) | ORAL | Status: DC
  Administered 2022-07-21 – 2022-07-26 (×12): 1000 mg via ORAL
  Filled 2022-07-21 (×15): qty 2

## 2022-07-21 MED ORDER — DEXTROSE 50 % IV SOLN: 0.0000 mL | INTRAVENOUS | Status: DC | PRN

## 2022-07-21 MED ORDER — POTASSIUM CHLORIDE 10 MEQ/50ML IV SOLN: 10.0000 meq | INTRAVENOUS | Status: AC

## 2022-07-21 MED ORDER — ROCURONIUM BROMIDE 10 MG/ML (PF) SYRINGE
PREFILLED_SYRINGE | INTRAVENOUS | Status: AC
  Filled 2022-07-21: qty 20

## 2022-07-21 MED ORDER — PROTAMINE SULFATE 10 MG/ML IV SOLN
INTRAVENOUS | Status: DC | PRN
  Administered 2022-07-21: 320 mg via INTRAVENOUS

## 2022-07-21 MED ORDER — FAMOTIDINE IN NACL 20-0.9 MG/50ML-% IV SOLN
20.0000 mg | Freq: Once | INTRAVENOUS | Status: AC
  Administered 2022-07-21: 20 mg via INTRAVENOUS
  Filled 2022-07-21: qty 50

## 2022-07-21 MED ORDER — ROCURONIUM BROMIDE 10 MG/ML (PF) SYRINGE
PREFILLED_SYRINGE | INTRAVENOUS | Status: AC
  Filled 2022-07-21: qty 10

## 2022-07-21 MED ORDER — ASPIRIN 81 MG PO CHEW: 324.0000 mg | CHEWABLE_TABLET | Freq: Every day | ORAL | Status: DC

## 2022-07-21 MED ORDER — ALBUMIN HUMAN 5 % IV SOLN
12.5000 g | Freq: Once | INTRAVENOUS | Status: AC
  Administered 2022-07-21: 12.5 g via INTRAVENOUS
  Filled 2022-07-21: qty 250

## 2022-07-21 MED ORDER — 0.9 % SODIUM CHLORIDE (POUR BTL) OPTIME
TOPICAL | Status: DC | PRN
  Administered 2022-07-21: 4000 mL
  Administered 2022-07-21: 1000 mL

## 2022-07-21 MED ORDER — SODIUM CHLORIDE 0.9 % IV SOLN: INTRAVENOUS | Status: DC

## 2022-07-21 MED ORDER — HEPARIN SODIUM (PORCINE) 1000 UNIT/ML IJ SOLN
INTRAMUSCULAR | Status: DC | PRN
  Administered 2022-07-21: 2000 [IU] via INTRAVENOUS
  Administered 2022-07-21: 30000 [IU] via INTRAVENOUS

## 2022-07-21 MED ORDER — ACETAMINOPHEN 160 MG/5ML PO SOLN
650.0000 mg | Freq: Once | ORAL | Status: AC
  Administered 2022-07-21: 650 mg
  Filled 2022-07-21: qty 20.3

## 2022-07-21 MED ORDER — ALBUMIN HUMAN 5 % IV SOLN
250.0000 mL | INTRAVENOUS | Status: DC | PRN
  Administered 2022-07-21 (×4): 12.5 g via INTRAVENOUS
  Filled 2022-07-21 (×3): qty 250

## 2022-07-21 MED ORDER — VANCOMYCIN HCL IN DEXTROSE 1-5 GM/200ML-% IV SOLN
1000.0000 mg | Freq: Once | INTRAVENOUS | Status: AC
  Administered 2022-07-21: 1000 mg via INTRAVENOUS
  Filled 2022-07-21: qty 200

## 2022-07-21 MED ORDER — ASPIRIN 81 MG PO CHEW
324.0000 mg | CHEWABLE_TABLET | Freq: Once | ORAL | Status: AC
  Administered 2022-07-21: 324 mg via ORAL
  Filled 2022-07-21: qty 4

## 2022-07-21 MED ORDER — ASPIRIN 325 MG PO TBEC
325.0000 mg | DELAYED_RELEASE_TABLET | Freq: Every day | ORAL | Status: DC
  Administered 2022-07-22 – 2022-07-23 (×2): 325 mg via ORAL
  Filled 2022-07-21 (×3): qty 1

## 2022-07-21 MED ORDER — MIDAZOLAM HCL 2 MG/2ML IJ SOLN
2.0000 mg | INTRAMUSCULAR | Status: DC | PRN
  Administered 2022-07-21 (×2): 2 mg via INTRAVENOUS
  Filled 2022-07-21 (×2): qty 2

## 2022-07-21 MED ORDER — METOPROLOL TARTRATE 12.5 MG HALF TABLET
12.5000 mg | ORAL_TABLET | Freq: Two times a day (BID) | ORAL | Status: DC
  Administered 2022-07-22 – 2022-07-26 (×8): 12.5 mg via ORAL
  Filled 2022-07-21 (×9): qty 1

## 2022-07-21 MED ORDER — SODIUM BICARBONATE 8.4 % IV SOLN
50.0000 meq | Freq: Once | INTRAVENOUS | Status: AC
  Administered 2022-07-21: 50 meq via INTRAVENOUS

## 2022-07-21 MED ORDER — ONDANSETRON HCL 4 MG/2ML IJ SOLN: 4.0000 mg | Freq: Four times a day (QID) | INTRAMUSCULAR | Status: DC | PRN

## 2022-07-21 MED ORDER — SODIUM CHLORIDE 0.9 % IV SOLN: INTRAVENOUS | Status: DC | PRN

## 2022-07-21 MED ORDER — MAGNESIUM SULFATE 4 GM/100ML IV SOLN
4.0000 g | Freq: Once | INTRAVENOUS | Status: AC
  Administered 2022-07-21: 4 g via INTRAVENOUS
  Filled 2022-07-21: qty 100

## 2022-07-21 MED ORDER — PROTAMINE SULFATE 10 MG/ML IV SOLN
INTRAVENOUS | Status: AC
  Filled 2022-07-21: qty 10

## 2022-07-21 MED ORDER — METOPROLOL TARTRATE 5 MG/5ML IV SOLN: 2.5000 mg | INTRAVENOUS | Status: DC | PRN

## 2022-07-21 MED ORDER — METOPROLOL TARTRATE 25 MG/10 ML ORAL SUSPENSION
12.5000 mg | Freq: Two times a day (BID) | ORAL | Status: DC
  Filled 2022-07-21 (×7): qty 5

## 2022-07-21 MED ORDER — HEPARIN SODIUM (PORCINE) 1000 UNIT/ML IJ SOLN
INTRAMUSCULAR | Status: AC
  Filled 2022-07-21: qty 1

## 2022-07-21 MED ORDER — CHLORHEXIDINE GLUCONATE 0.12 % MT SOLN
15.0000 mL | OROMUCOSAL | Status: AC
  Administered 2022-07-21: 15 mL via OROMUCOSAL
  Filled 2022-07-21: qty 15

## 2022-07-21 MED ORDER — TRAMADOL HCL 50 MG PO TABS
50.0000 mg | ORAL_TABLET | ORAL | Status: DC | PRN
  Administered 2022-07-21: 100 mg via ORAL
  Administered 2022-07-22 (×3): 50 mg via ORAL
  Filled 2022-07-21 (×3): qty 1
  Filled 2022-07-21: qty 2

## 2022-07-21 MED ORDER — SODIUM CHLORIDE 0.9% FLUSH
3.0000 mL | Freq: Two times a day (BID) | INTRAVENOUS | Status: DC
  Administered 2022-07-22 – 2022-07-23 (×3): 3 mL via INTRAVENOUS

## 2022-07-21 MED ORDER — PANTOPRAZOLE SODIUM 40 MG IV SOLR
40.0000 mg | Freq: Every day | INTRAVENOUS | Status: AC
  Administered 2022-07-21 – 2022-07-22 (×2): 40 mg via INTRAVENOUS
  Filled 2022-07-21 (×2): qty 10

## 2022-07-21 MED ORDER — MORPHINE SULFATE (PF) 2 MG/ML IV SOLN
1.0000 mg | INTRAVENOUS | Status: DC | PRN
  Administered 2022-07-21 – 2022-07-22 (×3): 2 mg via INTRAVENOUS
  Filled 2022-07-21 (×3): qty 1

## 2022-07-21 MED ORDER — GELATIN ABSORBABLE MT POWD
OROMUCOSAL | Status: DC | PRN
  Administered 2022-07-21: 4 via TOPICAL

## 2022-07-21 MED ORDER — MIDAZOLAM HCL (PF) 5 MG/ML IJ SOLN
INTRAMUSCULAR | Status: DC | PRN
  Administered 2022-07-21: 1 mg via INTRAVENOUS
  Administered 2022-07-21: 1.5 mg via INTRAVENOUS
  Administered 2022-07-21: 3 mg via INTRAVENOUS
  Administered 2022-07-21 (×2): 1 mg via INTRAVENOUS
  Administered 2022-07-21: 1.5 mg via INTRAVENOUS
  Administered 2022-07-21: 1 mg via INTRAVENOUS

## 2022-07-21 MED ORDER — HEPARIN SODIUM (PORCINE) 1000 UNIT/ML IJ SOLN
INTRAMUSCULAR | Status: AC
  Filled 2022-07-21: qty 10

## 2022-07-21 MED ORDER — OXYCODONE HCL 5 MG PO TABS
5.0000 mg | ORAL_TABLET | ORAL | Status: DC | PRN
  Filled 2022-07-21: qty 2

## 2022-07-21 SURGICAL SUPPLY — 95 items
ADH SKN CLS APL DERMABOND .7 (GAUZE/BANDAGES/DRESSINGS) ×3
BAG DECANTER FOR FLEXI CONT (MISCELLANEOUS) ×3 IMPLANT
BLADE 11 SAFETY STRL DISP (BLADE) IMPLANT
BLADE CLIPPER SURG (BLADE) ×3 IMPLANT
BLADE STERNUM SYSTEM 6 (BLADE) ×3 IMPLANT
BNDG CMPR MED 10X6 ELC LF (GAUZE/BANDAGES/DRESSINGS) ×3
BNDG CMPR STD VLCR NS LF 5.8X4 (GAUZE/BANDAGES/DRESSINGS) ×3
BNDG ELASTIC 4X5.8 VLCR NS LF (GAUZE/BANDAGES/DRESSINGS) IMPLANT
BNDG ELASTIC 4X5.8 VLCR STR LF (GAUZE/BANDAGES/DRESSINGS) ×3 IMPLANT
BNDG ELASTIC 6X10 VLCR STRL LF (GAUZE/BANDAGES/DRESSINGS) IMPLANT
BNDG ELASTIC 6X5.8 VLCR STR LF (GAUZE/BANDAGES/DRESSINGS) ×3 IMPLANT
BNDG GAUZE DERMACEA FLUFF 4 (GAUZE/BANDAGES/DRESSINGS) ×3 IMPLANT
BNDG GZE DERMACEA 4 6PLY (GAUZE/BANDAGES/DRESSINGS) ×3
CANISTER SUCT 3000ML PPV (MISCELLANEOUS) ×3 IMPLANT
CANNULA EZ GLIDE AORTIC 21FR (CANNULA) ×3 IMPLANT
CANNULA MC2 2 STG 36/46 NON-V (CANNULA) IMPLANT
CATH CPB KIT HENDRICKSON (MISCELLANEOUS) ×3 IMPLANT
CATH ROBINSON RED A/P 18FR (CATHETERS) ×3 IMPLANT
CATH THORACIC 36FR (CATHETERS) ×3 IMPLANT
CATH THORACIC 36FR RT ANG (CATHETERS) ×3 IMPLANT
CLIP TI MEDIUM 24 (CLIP) IMPLANT
CLIP TI WIDE RED SMALL 24 (CLIP) IMPLANT
CONTAINER PROTECT SURGISLUSH (MISCELLANEOUS) ×6 IMPLANT
COVER SURGICAL LIGHT HANDLE (MISCELLANEOUS) IMPLANT
DERMABOND ADVANCED .7 DNX12 (GAUZE/BANDAGES/DRESSINGS) IMPLANT
DRAPE CARDIOVASCULAR INCISE (DRAPES) ×3
DRAPE SRG 135X102X78XABS (DRAPES) ×3 IMPLANT
DRAPE WARM FLUID 44X44 (DRAPES) ×3 IMPLANT
DRSG COVADERM 4X14 (GAUZE/BANDAGES/DRESSINGS) ×3 IMPLANT
ELECT REM PT RETURN 9FT ADLT (ELECTROSURGICAL) ×6
ELECTRODE REM PT RTRN 9FT ADLT (ELECTROSURGICAL) ×6 IMPLANT
FELT TEFLON 1X6 (MISCELLANEOUS) ×6 IMPLANT
GAUZE 4X4 16PLY ~~LOC~~+RFID DBL (SPONGE) ×3 IMPLANT
GAUZE SPONGE 4X4 12PLY STRL (GAUZE/BANDAGES/DRESSINGS) ×6 IMPLANT
GLOVE BIOGEL PI IND STRL 6 (GLOVE) IMPLANT
GLOVE BIOGEL PI IND STRL 7.0 (GLOVE) IMPLANT
GLOVE SS BIOGEL STRL SZ 7.5 (GLOVE) ×3 IMPLANT
GLOVE SURG SIGNA 7.5 PF LTX (GLOVE) ×9 IMPLANT
GOWN STRL REUS W/ TWL LRG LVL3 (GOWN DISPOSABLE) ×12 IMPLANT
GOWN STRL REUS W/ TWL XL LVL3 (GOWN DISPOSABLE) ×6 IMPLANT
GOWN STRL REUS W/TWL LRG LVL3 (GOWN DISPOSABLE) ×12
GOWN STRL REUS W/TWL XL LVL3 (GOWN DISPOSABLE) ×6
HEMOSTAT POWDER SURGIFOAM 1G (HEMOSTASIS) ×9 IMPLANT
HEMOSTAT SURGICEL 2X14 (HEMOSTASIS) ×3 IMPLANT
INSERT FOGARTY XLG (MISCELLANEOUS) IMPLANT
KIT BASIN OR (CUSTOM PROCEDURE TRAY) ×3 IMPLANT
KIT SUCTION CATH 14FR (SUCTIONS) ×6 IMPLANT
KIT TURNOVER KIT B (KITS) ×3 IMPLANT
KIT VASOVIEW HEMOPRO 2 VH 4000 (KITS) ×3 IMPLANT
MARKER GRAFT CORONARY BYPASS (MISCELLANEOUS) ×9 IMPLANT
NS IRRIG 1000ML POUR BTL (IV SOLUTION) ×15 IMPLANT
PACK E OPEN HEART (SUTURE) ×3 IMPLANT
PACK OPEN HEART (CUSTOM PROCEDURE TRAY) ×3 IMPLANT
PAD ARMBOARD 7.5X6 YLW CONV (MISCELLANEOUS) ×6 IMPLANT
PAD ELECT DEFIB RADIOL ZOLL (MISCELLANEOUS) ×3 IMPLANT
PENCIL BUTTON HOLSTER BLD 10FT (ELECTRODE) ×3 IMPLANT
POSITIONER HEAD DONUT 9IN (MISCELLANEOUS) ×3 IMPLANT
PUNCH AORTIC ROTATE 4.0MM (MISCELLANEOUS) IMPLANT
PUNCH AORTIC ROTATE 4.5MM 8IN (MISCELLANEOUS) IMPLANT
PUNCH AORTIC ROTATE 5MM 8IN (MISCELLANEOUS) IMPLANT
SET MPS 3-ND DEL (MISCELLANEOUS) IMPLANT
SPONGE T-LAP 18X18 ~~LOC~~+RFID (SPONGE) ×12 IMPLANT
SPONGE T-LAP 4X18 ~~LOC~~+RFID (SPONGE) ×3 IMPLANT
SUPPORT HEART JANKE-BARRON (MISCELLANEOUS) ×3 IMPLANT
SUT BONE WAX W31G (SUTURE) ×3 IMPLANT
SUT MNCRL AB 4-0 PS2 18 (SUTURE) IMPLANT
SUT PROLENE 3 0 SH DA (SUTURE) ×3 IMPLANT
SUT PROLENE 4 0 RB 1 (SUTURE) ×6
SUT PROLENE 4 0 SH DA (SUTURE) IMPLANT
SUT PROLENE 4-0 RB1 .5 CRCL 36 (SUTURE) IMPLANT
SUT PROLENE 5 0 C 1 36 (SUTURE) IMPLANT
SUT PROLENE 6 0 C 1 30 (SUTURE) ×6 IMPLANT
SUT PROLENE 7 0 BV 1 (SUTURE) IMPLANT
SUT PROLENE 7 0 BV1 MDA (SUTURE) ×3 IMPLANT
SUT PROLENE 8 0 BV175 6 (SUTURE) IMPLANT
SUT SILK 1 MH (SUTURE) IMPLANT
SUT STEEL 6MS V (SUTURE) ×3 IMPLANT
SUT STEEL STERNAL CCS#1 18IN (SUTURE) IMPLANT
SUT STEEL SZ 6 DBL 3X14 BALL (SUTURE) ×3 IMPLANT
SUT VIC AB 1 CTX 36 (SUTURE) ×9
SUT VIC AB 1 CTX36XBRD ANBCTR (SUTURE) ×6 IMPLANT
SUT VIC AB 2-0 CT1 27 (SUTURE) ×3
SUT VIC AB 2-0 CT1 TAPERPNT 27 (SUTURE) IMPLANT
SUT VIC AB 2-0 CTX 27 (SUTURE) IMPLANT
SUT VIC AB 3-0 SH 27 (SUTURE)
SUT VIC AB 3-0 SH 27X BRD (SUTURE) IMPLANT
SUT VIC AB 3-0 X1 27 (SUTURE) IMPLANT
SUT VICRYL 4-0 PS2 18IN ABS (SUTURE) IMPLANT
SYSTEM SAHARA CHEST DRAIN ATS (WOUND CARE) ×3 IMPLANT
TOWEL GREEN STERILE (TOWEL DISPOSABLE) ×3 IMPLANT
TOWEL GREEN STERILE FF (TOWEL DISPOSABLE) ×3 IMPLANT
TRAY FOLEY SLVR 16FR TEMP STAT (SET/KITS/TRAYS/PACK) ×3 IMPLANT
TUBING LAP HI FLOW INSUFFLATIO (TUBING) ×3 IMPLANT
UNDERPAD 30X36 HEAVY ABSORB (UNDERPADS AND DIAPERS) ×3 IMPLANT
WATER STERILE IRR 1000ML POUR (IV SOLUTION) ×6 IMPLANT

## 2022-07-21 NOTE — OR Nursing (Signed)
Patient has two gauze pads placed on her bilateral nipples prior to ioban placement on patient's chest.

## 2022-07-21 NOTE — Anesthesia Procedure Notes (Signed)
Arterial Line Insertion Start/End6/26/2024 6:50 AM Performed by: Drema Pry, CRNA, CRNA  Patient location: Pre-op. Preanesthetic checklist: patient identified, IV checked, surgical consent, monitors and equipment checked and pre-op evaluation Lidocaine 1% used for infiltration Left, radial was placed Catheter size: 20 G Hand hygiene performed  and maximum sterile barriers used   Attempts: 1 Procedure performed without using ultrasound guided technique. Following insertion, dressing applied and Biopatch. Post procedure assessment: normal  Patient tolerated the procedure well with no immediate complications.

## 2022-07-21 NOTE — TOC Benefit Eligibility Note (Signed)
Pharmacy Patient Advocate Encounter  Insurance verification completed.    The patient is insured through  [employer] for Vascepa 1 mg capsules (Brand and Generic) and Product not Covered   This test claim was processed through [employer]- copay amounts may vary at other pharmacies due to [employer], or as the patient moves through the different stages of their insurance plan.    Roland Earl, CPHT Pharmacy Patient Advocate Specialist [facility] Patient Advocate Team Direct Number: [phone]  Fax: [phone]

## 2022-07-21 NOTE — Op Note (Signed)
Alisha Williams, Alisha Williams MEDICAL RECORD NO: [id number] ACCOUNT NO: [id number] DATE OF BIRTH: 20-Mar-1955 FACILITY: [facility] LOCATION: [facility] PHYSICIAN: Salvatore Decent. Dorris Fetch, MD  Operative Report   DATE OF PROCEDURE: 07/21/2022  PREOPERATIVE DIAGNOSIS:  Three-vessel coronary disease, status post nonST elevation myocardial infarction.  POSTOPERATIVE DIAGNOSIS:  Three-vessel coronary disease, status post nonST elevation myocardial infarction.  PROCEDURE PERFORMED:   Median sternotomy, extracorporeal circulation,  Coronary artery bypass grafting x4  Left internal mammary artery to LAD,  Saphenous vein graft to first diagonal,  Sequential saphenous vein graft to obtuse marginal 1 and posterolateral Endoscopic vein harvest, left leg.  SURGEON:  Salvatore Decent. Dorris Fetch, MD  ASSISTANT:  Jillyn Hidden, PA Experienced assistance was necessary for this case due to surgical complexity.  Jillyn Hidden, PA served as the Geophysicist/field seismologist providing assistance with independently harvesting the saphenous vein and closing the leg incisions as I harvested the mammary artery.  He also provided exposure, retraction of delicate tissues, suture management and suctioning during the anastomoses.  ANESTHESIA:  General.  FINDINGS:  Transesophageal echocardiography showed ejection fraction of approximately 35% with mild to moderate mitral regurgitation prebypass, improved left ventricular wall motion with ejection fraction of approximately 50% with mild mitral regurgitation post-bypass.  OM and PL poor quality targets.  LAD and diagonal fair quality targets.  Posterior descending ungraftable.  Mammary artery and saphenous vein good quality.  CLINICAL NOTE:  Alisha Williams is a 67 year old woman with multiple cardiac risk factors, who presented with a 1 week history of waxing and waning substernal chest pain.  She ruled in for non-ST elevation MI and was found to be in diabetic ketoacidosis.  She underwent cardiac catheterization,  which revealed severe 3-vessel disease.  She was offered the option of coronary artery bypass grafting for treatment of her 3-vessel disease.  The indications, risks, benefits, and alternatives were discussed in detail with the patient.  She understood and accepted the risks and agreed to proceed.  OPERATIVE NOTE:  Alisha Williams was brought to the preoperative holding area on 07/21/2022.  Anesthesia placed a Swan-Ganz catheter and an arterial blood pressure monitoring line.  She was taken to the operating room and anesthetized and intubated.  Intravenous antibiotics were administered.  A Foley catheter was placed.  Transesophageal echocardiography was performed by Dr. Jairo Ben.  Please see her separately dictated note for full details of the procedure.  Findings were as noted above.  The chest, abdomen and legs were prepped and draped in the usual sterile fashion.  A timeout was performed.  Jillyn Hidden, PA performed the vein harvest while I harvested the mammary artery.  An incision was made in the medial aspect of the left leg just below the knee.  The greater saphenous vein was identified, and was harvested from mid-calf to groin endoscopically.  The vein harvest was difficult due to body habitus and bleeding in the tract, but the vein was of good quality.  Simultaneously with the vein harvest a median sternotomy was performed and the left internal mammary artery was harvested using standard technique.  It was 2 mm, good quality vessel.  It had excellent flow when divided distally.  A sternal retractor was placed and was gradually opened over time.  The pericardium was opened.  The remainder of the full heparin dose was given intravenously.  After confirming adequate anticoagulation with ACT measurement, the aorta was cannulated via concentric 2-0 Ethibond pledgeted pursestring sutures.  A dual stage venous cannula was placed via a pursestring suture in the right atrial  appendage.   Cardiopulmonary bypass was initiated.  Flows were maintained per protocol.  She was cooled to 32 degrees Celsius.  The coronary arteries were inspected and anastomotic sites were chosen.  The vessels were small, particularly the OM and PL.  The LAD was intramyocardial and was not dissected out at this time.  The diagonal was slightly better target than the other two vessels.  The posterior descending was small, diffusely diseased and not suitable for grafting.  The conduits were prepared.  A foam pad was placed in the pericardium to insulate the heart.  A temperature probe was placed in the myocardial septum and a cardioplegia cannula was placed in the ascending aorta.  The aorta was crossclamped.  The left ventricle was emptied via the aortic root vent.  Cardiac arrest then was achieved with a combination of cold antegrade blood cardioplegia and topical iced saline.  One liter of cardioplegia was administered.  There was a rapid diastolic arrest and there was septal cooling to 9 degrees Celsius.  A reversed saphenous vein graft was placed sequentially to the obtuse marginal and the posterolateral branch of the right coronary.  The obtuse marginal was a high anterolateral branch.  It was diseased at the bifurcation and was grafted just beyond the bifurcation.  It was a 1 mm poor quality target.  A side-to-side anastomosis was performed with a running 7-0 Prolene suture.  Probe did pass distally in all the anastomoses prior to tying the sutures.  The distal end of the vein then was bevelled and was anastomosed to the posterolateral branch of the right coronary.  It was also a 1 mm poor quality target vessel, but again a probe did pass distally at the completion of the anastomosis. The end-to-side anastomosis was also done with a 7-0 prolene running suture.  Cardioplegia was administered down the graft.  There was a satisfactory flow and good hemostasis.  Next, the second diagonal branch of the LAD was exposed.   This was a 1.5 mm fair quality target. The vein graft was anastomosed end-to-side with a running 7-0 Prolene suture.  At the completion anastomosis, a probe passed distally.  Cardioplegia was administered and there was satisfactory flow and good hemostasis.  Additional cardioplegia was administered down the aortic root.  Next, the left anterior descending was dissected out.  It directly underneath the vein, which had to be clipped and divided.  The proximal portion of the artery was heavily calcified and was easily palpable.  The artery was dissected out more distally. There was an area relatively free of disease where an arteriotomy was made.  The vessel was fair quality.  It accepted a 1.5 mm probe.  There was plaque proximal to the anastomosis.  The mammary artery was a good quality conduit.  It was anastomosed end-to-side with a running 8-0 Prolene suture.  At the completion of the mammary to LAD anastomosis, the bulldog clamp was briefly removed and inspect for hemostasis.  There was good hemostasis at the anastomoses.  Septal rewarming was noted.  The bulldog clamp was replaced.  There was venous bleeding from the myocardial dissection.  Additional cardioplegia was administered.  The vein grafts were cut to length.  The cardioplegia cannula was removed from the ascending aorta and the proximal vein graft anastomoses were performed to 4.5 mm punch aortotomies with running 6-0 Prolene sutures.  At the completion of the final proximal anastomosis, the patient was placed in Trendelenburg position.  Lidocaine was administered.  The aortic  root was de-aired and the aortic crossclamp was removed.  The total crossclamp time was 86 minutes. The patient spontaneously resumed a bradycardic rhythm.  While rewarming was completed, all proximal and distal anastomoses were inspected for hemostasis.  Epicardial pacing wires were placed on the right ventricle and right atrium.  Atrial pacing was initiated at 80 beats per  minute.  When the patient had rewarmed to a core temperature of 37 degrees Celsius, she was weaned from cardiopulmonary bypass on the first attempt.  Total bypass time was 120 minutes, she was not on inotropic support at the time of separation from bypass.  The initial cardiac index was greater than 2 liters per minute per meter squared.  The patient remained hemodynamically stable throughout the post-bypass period.  Post-bypass transesophageal echocardiography showed improved left ventricular wall motion.  There still was some mild mitral regurgitation.  A test dose of protamine was administered.  The atrial and aortic cannulae were removed.  The remainder of the protamine was administered without incident.  The chest was irrigated with warm saline.  Hemostasis was achieved.  Left pleural and mediastinal chest tubes were placed through separate subcostal incisions and secured with #1 Silk sutures.  The sternum was closed with a combination of single and double heavy gauge stainless steel wires.  The pericardium was not closed.  The pectoralis fascia, subcutaneous tissue and skin were closed in standard fashion.  All sponge, needle and instrument counts were correct at the end of the procedure.  The patient was transferred from the operating room to the surgical intensive care unit, intubated and in fair condition.   PUS D: 07/21/2022 4:15:43 pm T: 07/21/2022 7:51:00 pm  JOB: [id number]/ [id number]

## 2022-07-21 NOTE — Progress Notes (Signed)
      [address]       [place] [address]             [phone]      S/p CABG x 4  Intubated, starting to wake up, following commands  BP 128/76   Pulse 99   Temp 98.4 F (36.9 C)   Resp (!) 22   Ht 5\' 8"  (1.727 m)   Wt 100.7 kg   SpO2 100%   BMI 33.76 kg/m   Ci 2.34   Intake/Output Summary (Last 24 hours) at 07/21/2022 1649 Last data filed at 07/21/2022 1400 Gross per 24 hour  Intake 3213.81 ml  Output 1235 ml  Net 1978.81 ml   ECG/ CXR reviewed Hgb 11.1, PLT 205 K  Genora Arp C. Dorris Fetch, MD [facility] [phone]

## 2022-07-21 NOTE — Anesthesia Procedure Notes (Signed)
Procedure Name: Intubation Date/Time: 07/21/2022 7:50 AM  Performed by: Drema Pry, CRNAPre-anesthesia Checklist: Patient identified, Emergency Drugs available, Suction available and Patient being monitored Patient Re-evaluated:Patient Re-evaluated prior to induction Oxygen Delivery Method: Circle System Utilized Preoxygenation: Pre-oxygenation with 100% oxygen Induction Type: IV induction Ventilation: Mask ventilation without difficulty and Oral airway inserted - appropriate to patient size Laryngoscope Size: Glidescope and 3 Grade View: Grade I Tube type: Oral Tube size: 7.5 mm Number of attempts: 1 Airway Equipment and Method: Stylet and Oral airway Placement Confirmation: ETT inserted through vocal cords under direct vision, positive ETCO2 and breath sounds checked- equal and bilateral Secured at: 20 cm Tube secured with: Tape Dental Injury: Teeth and Oropharynx as per pre-operative assessment  Difficulty Due To: Difficulty was anticipated, Difficult Airway- due to dentition, Difficult Airway- due to reduced neck mobility and Difficult Airway- due to anterior larynx Comments: DLx1 with MAC 3 - grade IV view - limited next extension, anterior larynx, and prominent upper front teeth. VLx1 with Glidescope 3 - grade I view. Easy mask ventilation with OA, recommend glidescope

## 2022-07-21 NOTE — Progress Notes (Signed)
   07/21/22 1409  Spiritual Encounters  Type of Visit Initial  Care provided to: Pt and family  Conversation partners present during encounter Nurse  Referral source Family  Reason for visit Routine spiritual support  OnCall Visit No  Spiritual Framework  Presenting Themes Meaning/purpose/sources of inspiration;Goals in life/care;Values and beliefs;Significant life change;Impactful experiences and emotions  Interventions  Spiritual Care Interventions Made Established relationship of care and support;Reflective listening;Narrative/life review;Prayer;Encouragement   Alisha Williams met family will transitioning through the waiting area.  Sat and spoke with PT's husband, son, and daughter.  Husband spoke of the suddenness of his wife's heart issues stating, "they came out of nowhere- she's been taking care of [place] so long she forgot about herself."  Husband and son shared brief life review and husband shared how this event has caused him to consider retirement at the first of the year, to spend more time with his wife and family.  Family asked me to go back and pray with the PT. Going back to the room I discovered the PT was just being brought up from surgery and would not be available to family for at least 90 minutes.  Chaplain prayed silently over PT while Rns completed their work.  Chap returned to the waiting area to inform family, and then pray with them. Chaplain services remain available via pageing Spiritual Care Department.

## 2022-07-21 NOTE — Procedures (Signed)
Extubation Procedure Note  Patient Details:   Name: Alisha Williams DOB: 1955/02/12 MRN: [id number]   Airway Documentation:    Vent end date: 07/21/22 Vent end time: 1823   Evaluation  O2 sats: stable throughout Complications: No apparent complications Patient did tolerate procedure well. Bilateral Breath Sounds: Clear, Diminished   Yes  Pt extubated per rapid wean protocol, positive cuff leak no stridor noted. Nif -30 VC  1.2L Vitals are stable on 3L  RN @ bedside  Paschal Dopp 07/21/2022, 6:29 PM

## 2022-07-21 NOTE — Transfer of Care (Signed)
Immediate Anesthesia Transfer of Care Note  Patient: Alisha Williams  Procedure(s) Performed: CORONARY ARTERY BYPASS GRAFTING (CABG) TIMES FOUR USING LEFT INTERNAL MAMMARY AND LEFT SAPHENOUS VEIN (Chest) TRANSESOPHAGEAL ECHOCARDIOGRAM ENDOVEIN HARVEST OF GREATER SAPHENOUS VEIN (Left: Leg Upper)  Patient Location: ICU  Anesthesia Type:General  Level of Consciousness: Patient remains intubated per anesthesia plan  Airway & Oxygen Therapy: Patient remains intubated per anesthesia plan and Patient placed on Ventilator (see vital sign flow sheet for setting)  Post-op Assessment: Report given to RN and Post -op Vital signs reviewed and stable  Post vital signs: Reviewed and stable  Last Vitals:  Vitals Value Taken Time  BP    Temp 36.7 C 07/21/22 1340  Pulse 86 07/21/22 1340  Resp 16 07/21/22 1340  SpO2 96 % 07/21/22 1340  Vitals shown include unvalidated device data.  Last Pain:  Vitals:   07/21/22 0451  TempSrc: Oral  PainSc:       Patients Stated Pain Goal: 0 (07/17/22 2152)  Complications:  Encounter Notable Events  Notable Event Outcome Phase Comment  Difficult to intubate - expected  Intraprocedure Filed from anesthesia note documentation.

## 2022-07-21 NOTE — Progress Notes (Signed)
  Echocardiogram Echocardiogram Transesophageal has been performed.  Milda Smart 07/21/2022, 12:07 PM

## 2022-07-21 NOTE — Anesthesia Postprocedure Evaluation (Signed)
Anesthesia Post Note  Patient: Alisha Williams  Procedure(s) Performed: CORONARY ARTERY BYPASS GRAFTING (CABG) TIMES FOUR USING LEFT INTERNAL MAMMARY AND LEFT SAPHENOUS VEIN (Chest) TRANSESOPHAGEAL ECHOCARDIOGRAM ENDOVEIN HARVEST OF GREATER SAPHENOUS VEIN (Left: Leg Upper)     Patient location during evaluation: SICU Anesthesia Type: General Level of consciousness: sedated and patient remains intubated per anesthesia plan Pain management: pain level controlled Vital Signs Assessment: post-procedure vital signs reviewed and stable Respiratory status: patient remains intubated per anesthesia plan and patient on ventilator - see flowsheet for VS Cardiovascular status: blood pressure returned to baseline and stable (stable off of pressors) Postop Assessment: no apparent nausea or vomiting Anesthetic complications: yes   Encounter Notable Events  Notable Event Outcome Phase Comment  Difficult to intubate - expected  Intraprocedure Filed from anesthesia note documentation.    Last Vitals:  Vitals:   07/21/22 1750 07/21/22 1823  BP: (!) 95/54   Pulse:  94  Resp: (!) 22 20  Temp:  37.9 C  SpO2: 99% 100%    Last Pain:  Vitals:   07/21/22 1400  TempSrc: Core  PainSc:                  Patches Mcdonnell,E. Kingson Lohmeyer

## 2022-07-21 NOTE — Interval H&P Note (Signed)
History and Physical Interval Note:  07/21/2022 7:17 AM  Alisha Williams  has presented today for surgery, with the diagnosis of CAD.  The various methods of treatment have been discussed with the patient and family. After consideration of risks, benefits and other options for treatment, the patient has consented to  Procedure(s): CORONARY ARTERY BYPASS GRAFTING (CABG) (N/A) TRANSESOPHAGEAL ECHOCARDIOGRAM (N/A) as a surgical intervention.  The patient's history has been reviewed, patient examined, no change in status, stable for surgery.  I have reviewed the patient's chart and labs.  Questions were answered to the patient's satisfaction.     Loreli Slot

## 2022-07-21 NOTE — Anesthesia Procedure Notes (Signed)
Central Venous Catheter Insertion Performed by: Jairo Ben, MD, anesthesiologist Start/End6/26/2024 6:31 AM, 07/21/2022 6:48 AM Patient location: Pre-op. Preanesthetic checklist: patient identified, IV checked, risks and benefits discussed, surgical consent, monitors and equipment checked, pre-op evaluation, timeout performed and anesthesia consent Position: Trendelenburg Lidocaine 1% used for infiltration and patient sedated Hand hygiene performed , maximum sterile barriers used  and Seldinger technique used Catheter size: 8.5 Fr PA cath was placed.Sheath introducer Swan type:thermodilution Procedure performed using ultrasound guided technique. Ultrasound Notes:anatomy identified, needle tip was noted to be adjacent to the nerve/plexus identified, no ultrasound evidence of intravascular and/or intraneural injection and image(s) printed for medical record Attempts: 1 Following insertion, line sutured, dressing applied and Biopatch. Post procedure assessment: free fluid flow, blood return through all ports and no air  Patient tolerated the procedure well with no immediate complications. Additional procedure comments: PA catheter:  Routine monitors. Timeout, sterile prep, drape, FBP R neck.  Trendelenburg position.  1% Lido local, finder and trocar RIJ 1st pass with US guidance.  Cordis placed over J wire. PA catheter in easily.  Sterile dressing applied.  Patient tolerated well, VSS.  Sandford Craze, MD.

## 2022-07-21 NOTE — Brief Op Note (Addendum)
07/13/2022 - 07/21/2022  12:11 PM  PATIENT:  Alisha Williams  67 y.o. female  PRE-OPERATIVE DIAGNOSIS:  CORONARY ARTERY DISEASE, NSTEMI  POST-OPERATIVE DIAGNOSIS:  CORONARY ARTERY DISEASE, NSTEMI   PROCEDURE:   CORONARY ARTERY BYPASS GRAFTING x 4 Endoscopic saphenous vein harvest left leg  LIMA-LAD SVG-Diag SVG-OM-PL (sequential)  TRANSESOPHAGEAL ECHOCARDIOGRAM Vein harvest time: Vein prep time:  SURGEON:   Loreli Slot, MD - Primary  PHYSICIAN ASSISTANT: Roddenberry  ASSISTANTS: Tanda Rockers, RN, RN First Assistant    ANESTHESIA:   general  EBL:   BLOOD ADMINISTERED:none  DRAINS:  Mediastinal and left pleural drains    LOCAL MEDICATIONS USED:  NONE  COUNTS:  Correct  DICTATION: .Dragon Dictation  PLAN OF CARE: Admit to inpatient   PATIENT DISPOSITION:  ICU - intubated and hemodynamically stable.   Delay start of Pharmacological VTE agent (>24hrs) due to surgical blood loss or risk of bleeding: yes  LAD and diagonal fair quality targets.  OM and PL poor quality, 1mm targets LIMA and SVG good quality

## 2022-07-22 ENCOUNTER — Encounter (HOSPITAL_COMMUNITY): Payer: Self-pay | Admitting: Thoracic Surgery (Cardiothoracic Vascular Surgery)

## 2022-07-22 ENCOUNTER — Inpatient Hospital Stay (HOSPITAL_COMMUNITY): Payer: 59

## 2022-07-22 DIAGNOSIS — I214 Non-ST elevation (NSTEMI) myocardial infarction: Secondary | ICD-10-CM | POA: Diagnosis not present

## 2022-07-22 DIAGNOSIS — Z951 Presence of aortocoronary bypass graft: Secondary | ICD-10-CM

## 2022-07-22 LAB — GLUCOSE, CAPILLARY
Glucose-Capillary: 114 mg/dL — ABNORMAL HIGH (ref 70–99)
Glucose-Capillary: 122 mg/dL — ABNORMAL HIGH (ref 70–99)
Glucose-Capillary: 122 mg/dL — ABNORMAL HIGH (ref 70–99)
Glucose-Capillary: 124 mg/dL — ABNORMAL HIGH (ref 70–99)
Glucose-Capillary: 128 mg/dL — ABNORMAL HIGH (ref 70–99)
Glucose-Capillary: 138 mg/dL — ABNORMAL HIGH (ref 70–99)
Glucose-Capillary: 159 mg/dL — ABNORMAL HIGH (ref 70–99)
Glucose-Capillary: 172 mg/dL — ABNORMAL HIGH (ref 70–99)
Glucose-Capillary: 175 mg/dL — ABNORMAL HIGH (ref 70–99)
Glucose-Capillary: 183 mg/dL — ABNORMAL HIGH (ref 70–99)
Glucose-Capillary: 188 mg/dL — ABNORMAL HIGH (ref 70–99)

## 2022-07-22 LAB — CBC
HCT: 32.5 % — ABNORMAL LOW (ref 36.0–46.0)
HCT: 31.3 % — ABNORMAL LOW (ref 36.0–46.0)
HCT: 30.3 % — ABNORMAL LOW (ref 36.0–46.0)
Hemoglobin: 10.2 g/dL — ABNORMAL LOW (ref 12.0–15.0)
Hemoglobin: 9.8 g/dL — ABNORMAL LOW (ref 12.0–15.0)
Hemoglobin: 9.4 g/dL — ABNORMAL LOW (ref 12.0–15.0)
MCH: 27.2 pg (ref 26.0–34.0)
MCH: 27.1 pg (ref 26.0–34.0)
MCH: 26.6 pg (ref 26.0–34.0)
MCHC: 31.4 g/dL (ref 30.0–36.0)
MCHC: 31.3 g/dL (ref 30.0–36.0)
MCHC: 31 g/dL (ref 30.0–36.0)
MCV: 86.7 fL (ref 80.0–100.0)
MCV: 86.7 fL (ref 80.0–100.0)
MCV: 85.6 fL (ref 80.0–100.0)
Platelets: 193 10*3/uL (ref 150–400)
Platelets: 176 10*3/uL (ref 150–400)
Platelets: 152 10*3/uL (ref 150–400)
RBC: 3.75 MIL/uL — ABNORMAL LOW (ref 3.87–5.11)
RBC: 3.61 MIL/uL — ABNORMAL LOW (ref 3.87–5.11)
RBC: 3.54 MIL/uL — ABNORMAL LOW (ref 3.87–5.11)
RDW: 14.3 % (ref 11.5–15.5)
RDW: 14.5 % (ref 11.5–15.5)
RDW: 14.4 % (ref 11.5–15.5)
WBC: 9.5 10*3/uL (ref 4.0–10.5)
WBC: 6.8 10*3/uL (ref 4.0–10.5)
WBC: 6 10*3/uL (ref 4.0–10.5)
nRBC: 0 % (ref 0.0–0.2)
nRBC: 0 % (ref 0.0–0.2)
nRBC: 0 % (ref 0.0–0.2)

## 2022-07-22 LAB — BASIC METABOLIC PANEL
Anion gap: 7 (ref 5–15)
Anion gap: 9 (ref 5–15)
BUN: 5 mg/dL — ABNORMAL LOW (ref 8–23)
BUN: 10 mg/dL (ref 8–23)
CO2: 22 mmol/L (ref 22–32)
CO2: 21 mmol/L — ABNORMAL LOW (ref 22–32)
Calcium: 8.3 mg/dL — ABNORMAL LOW (ref 8.9–10.3)
Calcium: 7.9 mg/dL — ABNORMAL LOW (ref 8.9–10.3)
Chloride: 106 mmol/L (ref 98–111)
Chloride: 104 mmol/L (ref 98–111)
Creatinine, Ser: 0.57 mg/dL (ref 0.44–1.00)
Creatinine, Ser: 0.75 mg/dL (ref 0.44–1.00)
GFR, Estimated: >60 mL/min (ref >60)
GFR, Estimated: >60 mL/min (ref >60)
Glucose, Bld: 105 mg/dL — ABNORMAL HIGH (ref 70–99)
Glucose, Bld: 182 mg/dL — ABNORMAL HIGH (ref 70–99)
Potassium: 4.2 mmol/L (ref 3.5–5.1)
Potassium: 3.8 mmol/L (ref 3.5–5.1)
Sodium: 135 mmol/L (ref 135–145)
Sodium: 134 mmol/L — ABNORMAL LOW (ref 135–145)

## 2022-07-22 LAB — TYPE AND SCREEN: Unit division: 0

## 2022-07-22 LAB — BPAM RBC
Blood Product Expiration Date: 202407242359
ISSUE DATE / TIME: 202406260850
ISSUE DATE / TIME: 202406260850

## 2022-07-22 LAB — CREATININE, SERUM
Creatinine, Ser: 0.56 mg/dL (ref 0.44–1.00)
GFR, Estimated: >60 mL/min (ref >60)

## 2022-07-22 LAB — MAGNESIUM
Magnesium: 2.4 mg/dL (ref 1.7–2.4)
Magnesium: 2 mg/dL (ref 1.7–2.4)

## 2022-07-22 MED ORDER — INSULIN DETEMIR 100 UNIT/ML ~~LOC~~ SOLN
25.0000 [IU] | Freq: Two times a day (BID) | SUBCUTANEOUS | Status: DC
  Administered 2022-07-22 (×2): 25 [IU] via SUBCUTANEOUS
  Filled 2022-07-22 (×4): qty 0.25

## 2022-07-22 MED ORDER — ENOXAPARIN SODIUM 40 MG/0.4ML IJ SOSY
40.0000 mg | PREFILLED_SYRINGE | Freq: Every day | INTRAMUSCULAR | Status: DC
  Administered 2022-07-22 – 2022-07-25 (×4): 40 mg via SUBCUTANEOUS
  Filled 2022-07-22 (×4): qty 0.4

## 2022-07-22 MED ORDER — FUROSEMIDE 10 MG/ML IJ SOLN
20.0000 mg | Freq: Two times a day (BID) | INTRAMUSCULAR | Status: DC
  Administered 2022-07-22 (×2): 20 mg via INTRAVENOUS
  Filled 2022-07-22 (×2): qty 2

## 2022-07-22 MED ORDER — INSULIN ASPART 100 UNIT/ML IJ SOLN
0.0000 [IU] | INTRAMUSCULAR | Status: DC
  Administered 2022-07-22 (×4): 4 [IU] via SUBCUTANEOUS
  Administered 2022-07-23: 2 [IU] via SUBCUTANEOUS

## 2022-07-22 MED FILL — Lidocaine HCl Local Preservative Free (PF) Inj 2%: INTRAMUSCULAR | Qty: 14 | Status: AC

## 2022-07-22 MED FILL — Heparin Sodium (Porcine) Inj 1000 Unit/ML: Qty: 1000 | Status: AC

## 2022-07-22 MED FILL — Magnesium Sulfate Inj 50%: INTRAMUSCULAR | Qty: 10 | Status: AC

## 2022-07-22 NOTE — Progress Notes (Addendum)
[facility] DAILY ICU PROGRESS NOTE                   [address]            [place] [address]          [phone]   1 Day Post-Op Procedure(s) (LRB): CORONARY ARTERY BYPASS GRAFTING (CABG) TIMES FOUR USING LEFT INTERNAL MAMMARY AND LEFT SAPHENOUS VEIN (N/A) TRANSESOPHAGEAL ECHOCARDIOGRAM (N/A) ENDOVEIN HARVEST OF GREATER SAPHENOUS VEIN (Left)  Total Length of Stay:  LOS: 9 days   Subjective: Extubated last evening.  Awake and alert, stood to weigh this morning.  No events over night.   Neo at 53mcg/min.  Objective: Vital signs in last 24 hours: Temp:  [97.7 F (36.5 C)-100.4 F (38 C)] 99.3 F (37.4 C) (06/27 0645) Pulse Rate:  [77-99] 78 (06/27 0645) Cardiac Rhythm: Normal sinus rhythm (06/27 0400) Resp:  [0-34] 22 (06/27 0645) BP: (88-128)/(54-76) 88/59 (06/27 0600) SpO2:  [96 %-100 %] 100 % (06/27 0645) Arterial Line BP: (82-138)/(47-78) 115/51 (06/27 0645) FiO2 (%):  [40 %-50 %] 40 % (06/26 1750) Weight:  [107.6 kg] 107.6 kg (06/27 0500)  Filed Weights   07/20/22 1800 07/21/22 0451 07/22/22 0500  Weight: 101.7 kg 100.7 kg 107.6 kg    Weight change: 5.904 kg   Hemodynamic parameters for last 24 hours: PAP: (21-40)/(8-26) 28/13 CO:  [3.9 L/min-5.1 L/min] 4.3 L/min CI:  [1.8 L/min/m2-2.4 L/min/m2] 2 L/min/m2  Intake/Output from previous day: 06/26 0701 - 06/27 0700 In: 5753.5 [I.V.:3645.9; Blood:445; IV Piggyback:1662.6] Out: 3390 [Urine:2920; Chest Tube:470]  Intake/Output this shift: No intake/output data recorded.  Current Meds: Scheduled Meds:  acetaminophen  1,000 mg Oral Q6H   Or   acetaminophen (TYLENOL) oral liquid 160 mg/5 mL  1,000 mg Per Tube Q6H   aspirin EC  325 mg Oral Daily   Or   aspirin  324 mg Per Tube Daily   atorvastatin  80 mg Oral Daily   bisacodyl  10 mg Oral Daily   Or   bisacodyl  10 mg Rectal Daily   Chlorhexidine Gluconate Cloth  6 each Topical Daily   docusate sodium  200 mg Oral Daily   levothyroxine   125 mcg Oral Daily   metoCLOPramide (REGLAN) injection  10 mg Intravenous Q6H   metoprolol tartrate  12.5 mg Oral BID   Or   metoprolol tartrate  12.5 mg Per Tube BID   [START ON 07/23/2022] pantoprazole  40 mg Oral Daily   pantoprazole (PROTONIX) IV  40 mg Intravenous QHS   sodium chloride flush  3 mL Intravenous Q12H   Continuous Infusions:  sodium chloride     sodium chloride 1 mL/hr at 07/22/22 0600   sodium chloride 10 mL/hr at 07/22/22 0600   albumin human 12.5 g (07/21/22 1910)    ceFAZolin (ANCEF) IV 200 mL/hr at 07/22/22 0600   dexmedetomidine (PRECEDEX) IV infusion Stopped (07/21/22 1613)   insulin 2.6 Units/hr (07/22/22 0600)   lactated ringers     lactated ringers 20 mL/hr at 07/22/22 0600   phenylephrine (NEO-SYNEPHRINE) Adult infusion 30 mcg/min (07/22/22 0600)   PRN Meds:.sodium chloride, albumin human, dextrose, metoprolol tartrate, morphine injection, ondansetron (ZOFRAN) IV, oxyCODONE, sodium chloride flush, traMADol  General appearance: alert, cooperative, and no distress Neurologic: intact Heart: RRR Lungs: clear breath sounds, good air movement and normal WOB. CXR with expected post-op changes. CT drainage minimal.  Abdomen: soft, non-tender Extremities: expected bruising LLE along EVH tunnel.  Wound: sternal  dressing is dry.  Lab Results: CBC: Recent Labs    07/21/22 2043 07/22/22 0419  WBC 8.6 9.5  HGB 10.0* 10.2*  HCT 32.3* 32.5*  PLT 214 193   BMET:  Recent Labs    07/21/22 2043 07/22/22 0419  NA 134* 135  K 4.1 4.2  CL 101 106  CO2 22 22  GLUCOSE 158* 105*  BUN 6* 5*  CREATININE 0.53 0.57  CALCIUM 9.0 8.3*    CMET: Lab Results  Component Value Date   WBC 9.5 07/22/2022   HGB 10.2 (L) 07/22/2022   HCT 32.5 (L) 07/22/2022   PLT 193 07/22/2022   GLUCOSE 105 (H) 07/22/2022   CHOL 223 (H) 07/14/2022   TRIG 442 (H) 07/14/2022   HDL 35 (L) 07/14/2022   LDLDIRECT 124 (H) 07/14/2022   LDLCALC UNABLE TO CALCULATE IF TRIGLYCERIDE OVER  400 mg/dL 16/10/9602   ALT 21 54/09/8117   AST 31 07/13/2022   NA 135 07/22/2022   K 4.2 07/22/2022   CL 106 07/22/2022   CREATININE 0.57 07/22/2022   BUN 5 (L) 07/22/2022   CO2 22 07/22/2022   TSH 5.031 (H) 07/14/2022   INR 1.3 (H) 07/21/2022   HGBA1C 11.2 (H) 07/13/2022   MICROALBUR <0.7 08/02/2014      PT/INR:  Recent Labs    07/21/22 1341  LABPROT 16.3*  INR 1.3*   Radiology: DG Chest Port 1 View  Result Date: 07/21/2022 CLINICAL DATA:  Postoperative state after CABG EXAM: PORTABLE CHEST 1 VIEW COMPARISON:  Previous studies including the examination of 07/20/2022 FINDINGS: Transverse diameter of heart is increased. Low lung volumes. Increased markings are seen in medial left lower lung field. Tip of endotracheal tube is 4.6 cm above the carina. Enteric tube is noted traversing the esophagus. Tip of right IJ Swan-Ganz catheter is noted at the bifurcation of main pulmonary artery. Mediastinal drain and left chest tube are noted. IMPRESSION: Status post CABG. Increased density is seen in left lower lung field partly obscuring left hemidiaphragm suggesting small left pleural effusion and possibly underlying infiltrate. Support devices as described above. Electronically Signed   By: Ernie Avena M.D.   On: 07/21/2022 14:24   EP STUDY  Result Date: 07/21/2022 See surgical note for result.    Assessment/Plan: S/P Procedure(s) (LRB): CORONARY ARTERY BYPASS GRAFTING (CABG) TIMES FOUR USING LEFT INTERNAL MAMMARY AND LEFT SAPHENOUS VEIN (N/A) TRANSESOPHAGEAL ECHOCARDIOGRAM (N/A) ENDOVEIN HARVEST OF GREATER SAPHENOUS VEIN (Left)  -POD1 CABG x 4. Stable VS and hemodynamics. Weaning the Neo. D/C the PA catheter and chest tubes. Mobilize.   -Heme- mild EBL anemia. Monitor.   -ENDO- H/O type 2 DM. Controlled on insulin drip, transition to SSI.  -RENAL- Normal function at baseline. Wt ~6kg+.  Hold off on diuresis until BP stable off Neo.   -GI- no nausea, advance diet as  tolerated.   -PULM- oxygenating well on 2Lnc, working on [employer].   -DVT PPX- daily SQ enoxappariin, ambulate.    Leary Roca 07/22/2022 7:43 AM Patient seen and examined, agree with above D/w Mr. Hedwig Morton Dc chest tubes, Swan and A line Transition to levemir + SSI Cardiac rehab  Salvatore Decent. Dorris Fetch, MD [facility] [phone]

## 2022-07-22 NOTE — Progress Notes (Signed)
Rounding Note    Patient Name: Alisha Williams Date of Encounter: 07/22/2022  [facility] Cardiologist: Dr Jens Som  Subjective   POD #1 - extubated, appears to be progressing normally. BP has been low on neosynephrine. HB stable.   Inpatient Medications    Scheduled Meds:  acetaminophen  1,000 mg Oral Q6H   Or   acetaminophen (TYLENOL) oral liquid 160 mg/5 mL  1,000 mg Per Tube Q6H   aspirin EC  325 mg Oral Daily   Or   aspirin  324 mg Per Tube Daily   atorvastatin  80 mg Oral Daily   bisacodyl  10 mg Oral Daily   Or   bisacodyl  10 mg Rectal Daily   Chlorhexidine Gluconate Cloth  6 each Topical Daily   docusate sodium  200 mg Oral Daily   enoxaparin (LOVENOX) injection  40 mg Subcutaneous QHS   furosemide  20 mg Intravenous BID   insulin aspart  0-24 Units Subcutaneous Q4H   insulin detemir  25 Units Subcutaneous BID   levothyroxine  125 mcg Oral Daily   metoCLOPramide (REGLAN) injection  10 mg Intravenous Q6H   metoprolol tartrate  12.5 mg Oral BID   Or   metoprolol tartrate  12.5 mg Per Tube BID   [START ON 07/23/2022] pantoprazole  40 mg Oral Daily   pantoprazole (PROTONIX) IV  40 mg Intravenous QHS   sodium chloride flush  3 mL Intravenous Q12H   Continuous Infusions:  sodium chloride     sodium chloride 1 mL/hr at 07/22/22 0600   sodium chloride 10 mL/hr at 07/22/22 0600   albumin human 12.5 g (07/21/22 1910)    ceFAZolin (ANCEF) IV 200 mL/hr at 07/22/22 0600   dexmedetomidine (PRECEDEX) IV infusion Stopped (07/21/22 1613)   lactated ringers     lactated ringers 20 mL/hr at 07/22/22 0600   phenylephrine (NEO-SYNEPHRINE) Adult infusion 30 mcg/min (07/22/22 0600)   PRN Meds: sodium chloride, albumin human, metoprolol tartrate, morphine injection, ondansetron (ZOFRAN) IV, oxyCODONE, sodium chloride flush, traMADol   Vital Signs    Vitals:   07/22/22 0600 07/22/22 0615 07/22/22 0630 07/22/22 0645  BP: (!) 88/59     Pulse: 87 78 77 78  Resp: (!)  23 20 19  (!) 22  Temp: 98.4 F (36.9 C) 99.1 F (37.3 C) 99.1 F (37.3 C) 99.3 F (37.4 C)  TempSrc:      SpO2: 99% 98% 99% 100%  Weight:      Height:        Intake/Output Summary (Last 24 hours) at 07/22/2022 0829 Last data filed at 07/22/2022 0800 Gross per 24 hour  Intake 5453.47 ml  Output 3480 ml  Net 1973.47 ml      07/22/2022    5:00 AM 07/21/2022    4:51 AM 07/20/2022    6:00 PM  Last 3 Weights  Weight (lbs) 237 lb 3.4 oz 222 lb 0.1 oz 224 lb 3.2 oz  Weight (kg) 107.6 kg 100.7 kg 101.696 kg      Telemetry    Normal sinus rhythm at 79 - Personally Reviewed  Physical Exam   GEN: NAD Neck: No JVD, supple Cardiac: RRR Respiratory: CTA GI: Soft, NT/ND MS: No edema; radial cath site with no hematoma Neuro: Grossly intact Psych: Normal affect   Labs    High Sensitivity Troponin:   Recent Labs  Lab 07/13/22 2245 07/14/22 0000  TROPONINIHS 3,215* 2,338*     Chemistry Recent Labs  Lab 07/21/22 1723  07/21/22 1816 07/21/22 1954 07/21/22 2043 07/22/22 0419  NA 135   < > 138 134* 135  K 4.1   < > 4.2 4.1 4.2  CL 106  --   --  101 106  CO2 21*  --   --  22 22  GLUCOSE 133*  --   --  158* 105*  BUN <5*  --   --  6* 5*  CREATININE 0.49  --   --  0.53 0.57  CALCIUM 7.9*  --   --  9.0 8.3*  MG 2.4  --   --  2.6* 2.4  GFRNONAA >60  --   --  >60 >60  ANIONGAP 8  --   --  11 7   < > = values in this interval not displayed.    Lipids  No results for input(s): "CHOL", "TRIG", "HDL", "LABVLDL", "LDLCALC", "CHOLHDL" in the last 168 hours.   Hematology Recent Labs  Lab 07/21/22 1723 07/21/22 1816 07/21/22 1954 07/21/22 2043 07/22/22 0419  WBC 9.3  --   --  8.6 9.5  RBC 4.02  --   --  3.82* 3.75*  HGB 10.5*   < > 11.2* 10.0* 10.2*  HCT 33.8*   < > 33.0* 32.3* 32.5*  MCV 84.1  --   --  84.6 86.7  MCH 26.1  --   --  26.2 27.2  MCHC 31.1  --   --  31.0 31.4  RDW 14.0  --   --  14.0 14.3  PLT 210  --   --  214 193   < > = values in this interval not  displayed.   Thyroid  No results for input(s): "TSH", "FREET4" in the last 168 hours.   BNP No results for input(s): "BNP", "PROBNP" in the last 168 hours.    Radiology    DG Chest Port 1 View  Result Date: 07/22/2022 CLINICAL DATA:  Status post CABG EXAM: PORTABLE CHEST 1 VIEW COMPARISON:  Previous studies including the examination done on 07/21/2022 FINDINGS: There is previous cardiac surgery. There is interval removal of endotracheal tube and NG tube. Tip of right IJ Swan-Ganz catheter is seen at the bifurcation of main pulmonary artery. Mediastinal drain and left chest tube have not changed. Transverse diameter of heart is increased. Low lung volumes. Patchy densities are seen in left lower lung field. Lateral CP angles are clear. There is no pneumothorax. IMPRESSION: Patchy infiltrate in left lower lung field may suggest atelectasis/pneumonia. There are no signs of alveolar pulmonary edema. Support devices as described above. Electronically Signed   By: Ernie Avena M.D.   On: 07/22/2022 08:26   ECHO INTRAOPERATIVE TEE  Result Date: 07/21/2022  *INTRAOPERATIVE TRANSESOPHAGEAL REPORT *  Patient Name:   Alisha Williams    Date of Exam: 07/21/2022 Medical Rec #:  [id number]      Height:       68.0 in Accession #:    [id number]     Weight:       222.0 lb Date of Birth:  March 16, 1955     BSA:          2.14 m Patient Age:    66 years       BP:           104/54 mmHg Patient Gender: F              HR:           62 bpm. Exam Location:  Anesthesiology Transesophogeal exam was perform intraoperatively during surgical procedure. Patient was closely monitored under general anesthesia during the entirety of examination. Indications:     CABG Performing Phys: [id number] Salvatore Decent HENDRICKSON Diagnosing Phys: Jairo Ben MD Complications: No known complications during this procedure. POST-OP IMPRESSIONS limited post CPB exam: The patient separate easily from CPB. _ Left Ventricle: Left ventricular contractility  has improved, with some recovery of contractility in the inferior segment. Overall the LV has mildly reduced systolic function, EF 50%. _ Right Ventricle: The right ventricular function appears normal. _ Aortic Valve: The aortic valve appears normal, unchanged from pre-bypass images. _ Mitral Valve: The mitral valve had mild- moderate MR on imitial separation from CPB. This improved to mild regurgitation with time off of pump.. _ Tricuspid Valve: The tricuspid valve appears essnetially unchanged from pre-bypass images. There is mild MR. PRE-OP FINDINGS  Left Ventricle: The left ventricle has moderate-severely reduced systolic function, with an ejection fraction of 30-35%, measured 31%. The cavity size was mildly dilated. Left ventricular diffuse hypokinesis. Severe hypokinesis of the entire inferior wall. There is no left ventricular hypertrophy. Left ventricular diastolic function was not evaluated. Right Ventricle: The right ventricle has mildly reduced systolic function. The cavity was normal. There is no increase in right ventricular wall thickness. Catheter present in the right ventricle. Left Atrium: Left atrial size was normal in size. No left atrial/left atrial appendage thrombus was detected. Left atrial appendage velocity is normal at greater than 40 cm/s. Right Atrium: Right atrial size was normal in size. Prominent Eustachian valve. Catheter present in the right atrium. Interatrial Septum: No atrial level shunt detected by color flow Doppler. There is no evidence of a patent foramen ovale. Pericardium: There is no evidence of pericardial effusion. Mitral Valve: The mitral valve is normal in structure. Mitral valve regurgitation is mild-moderate by color flow Doppler. The MR jet is centrally-directed. There is no evidence of mitral valve vegetation. Pulmonary venous flow is blunted (decreased). There is no evidence of mitral stenosis, with peak gradient 5 mmHg, mean gradient 1 mmHg. Tricuspid Valve: The  tricuspid valve was normal in structure. Tricuspid valve regurgitation was not visualized by color flow Doppler. No evidence of tricuspid stenosis is present. There is no evidence of tricuspid valve vegetation. Aortic Valve: The aortic valve is tricuspid. Aortic valve regurgitation was not visualized by color flow Doppler. There is no stenosis of the aortic valve, with peak gradient 7 mmHg, mean gradient 4 mmHg. There is no evidence of aortic valve vegetation. Pulmonic Valve: The pulmonic valve was normal in structure, with normal leaflet mobility. No evidence of pulmonic stenosis. Pulmonic valve regurgitation is trivial, around the PA catheter, by color flow Doppler. Aorta: The aortic root, ascending aorta and aortic arch are normal in size and structure. Pulmonary Artery: Theone Murdoch catheter present on the left. The pulmonary artery is of normal size. Venous: The inferior vena cava is normal in size with greater than 50% respiratory variability, suggesting right atrial pressure of 3 mmHg. Shunts: There is no evidence of an atrial septal defect. +-------------+--------++ AORTIC VALVE          +-------------+--------++ AV Mean Grad:4.0 mmHg +-------------+--------++ +-------------+--------++ MITRAL VALVE          +-------------+--------++ MV Mean grad:1.0 mmHg +-------------+--------++  Jairo Ben MD Electronically signed by Jairo Ben MD Signature Date/Time: 07/21/2022/7:03:38 PM    Final    DG Chest Port 1 View  Result Date: 07/21/2022 CLINICAL DATA:  Postoperative state after CABG EXAM: PORTABLE  CHEST 1 VIEW COMPARISON:  Previous studies including the examination of 07/20/2022 FINDINGS: Transverse diameter of heart is increased. Low lung volumes. Increased markings are seen in medial left lower lung field. Tip of endotracheal tube is 4.6 cm above the carina. Enteric tube is noted traversing the esophagus. Tip of right IJ Swan-Ganz catheter is noted at the bifurcation of main  pulmonary artery. Mediastinal drain and left chest tube are noted. IMPRESSION: Status post CABG. Increased density is seen in left lower lung field partly obscuring left hemidiaphragm suggesting small left pleural effusion and possibly underlying infiltrate. Support devices as described above. Electronically Signed   By: Ernie Avena M.D.   On: 07/21/2022 14:24   EP STUDY  Result Date: 07/21/2022 See surgical note for result.  DG Chest 2 View  Result Date: 07/20/2022 CLINICAL DATA:  Chest pain.  History of diabetes. EXAM: CHEST - 2 VIEW COMPARISON:  Radiographs 07/13/2022 and 10/24/2012. FINDINGS: The heart size and mediastinal contours are normal. Previously demonstrated diffuse interstitial opacities have resolved, likely due to resolved edema. There is mild chronic central airway thickening. No confluent airspace disease, pleural effusion or pneumothorax. No acute osseous findings are evident. There are mild degenerative changes in the spine. Telemetry leads overlie the chest. IMPRESSION: No evidence of acute cardiopulmonary process. Mild chronic central airway thickening. Electronically Signed   By: Carey Bullocks M.D.   On: 07/20/2022 15:18      Patient Profile     68 y.o. female with past medical history of diabetes mellitus, hypothyroidism admitted with non-ST elevation myocardial infarction.  Cardiac catheterization revealed severe three-vessel coronary artery disease, ejection fraction 45 to 50% and elevated left ventricular end-diastolic pressure.  Echocardiogram shows ejection fraction 50 to 55%, mild left ventricular hypertrophy, hypokinesis of the basal inferior wall and inferolateral wall, mild mitral regurgitation.  Assessment & Plan    1 non-ST elevation myocardial infarction-patient remains pain-free.  Continue aspirin, heparin, statin and metoprolol.  Cardiac catheterization reveals severe three-vessel coronary artery disease and echocardiogram shows preserved LV function.  POD #1 s/p CABG, some hypotension, but progressing well.  2 acute diastolic congestive heart failure-left ventricular end-diastolic pressure elevated at time of cardiac catheterization.  She is symptomatically improved and appears euvolemic. Sodium is trending down, but creatinine stable. Renal function stable. Net positive, but good urine output yesterday of almost 3L.   3 diabetes mellitus-continue present regimen.  Follow CBGs. Appreciate diabetes coordinator recommendations, have made suggested adjustments.  4. Dyslipidemia - noted to have high LP(a) of 238.2 nmol/L. TC 223, TG 442, HDL 35, dLDL 124.  On atorva 80 and ezetimibe. Good candidate to add PCSK9i at follow-up - with target LDL <55 if achievable.  5. Bladder pressure - now on perioperative cefazolin, has completed 3 day course for presumptive UTI.  6. Constipation - no BM yesterday with miralax -written for dulcolax.  They would like to follow-up in [place] if possible.  For questions or updates, please contact [facility] Please consult [email or web address] for contact info under   Chrystie Nose, MD, Milagros Loll  [facility]  Medical Director of the [facility]  [facility] Diplomate of the American Board of Clinical Lipidology Attending Cardiologist  Direct Dial: [phone]  Fax: [phone]  Website:  [email or web address]  Chrystie Nose, MD  07/22/2022, 8:29 AM

## 2022-07-23 ENCOUNTER — Inpatient Hospital Stay (HOSPITAL_COMMUNITY): Payer: 59

## 2022-07-23 ENCOUNTER — Other Ambulatory Visit (HOSPITAL_COMMUNITY): Payer: Self-pay

## 2022-07-23 LAB — CBC
HCT: 29.5 % — ABNORMAL LOW (ref 36.0–46.0)
Hemoglobin: 9.1 g/dL — ABNORMAL LOW (ref 12.0–15.0)
MCH: 26.5 pg (ref 26.0–34.0)
MCHC: 30.8 g/dL (ref 30.0–36.0)
MCV: 85.8 fL (ref 80.0–100.0)
Platelets: 149 10*3/uL — ABNORMAL LOW (ref 150–400)
RBC: 3.44 MIL/uL — ABNORMAL LOW (ref 3.87–5.11)
RDW: 14.5 % (ref 11.5–15.5)
WBC: 6.1 10*3/uL (ref 4.0–10.5)
nRBC: 0 % (ref 0.0–0.2)

## 2022-07-23 LAB — BASIC METABOLIC PANEL
Anion gap: 7 (ref 5–15)
BUN: 9 mg/dL (ref 8–23)
CO2: 24 mmol/L (ref 22–32)
Calcium: 7.9 mg/dL — ABNORMAL LOW (ref 8.9–10.3)
Chloride: 102 mmol/L (ref 98–111)
Creatinine, Ser: 0.62 mg/dL (ref 0.44–1.00)
GFR, Estimated: >60 mL/min (ref >60)
Glucose, Bld: 149 mg/dL — ABNORMAL HIGH (ref 70–99)
Potassium: 3.7 mmol/L (ref 3.5–5.1)
Sodium: 133 mmol/L — ABNORMAL LOW (ref 135–145)

## 2022-07-23 LAB — GLUCOSE, CAPILLARY
Glucose-Capillary: 160 mg/dL — ABNORMAL HIGH (ref 70–99)
Glucose-Capillary: 161 mg/dL — ABNORMAL HIGH (ref 70–99)
Glucose-Capillary: 131 mg/dL — ABNORMAL HIGH (ref 70–99)
Glucose-Capillary: 141 mg/dL — ABNORMAL HIGH (ref 70–99)
Glucose-Capillary: 121 mg/dL — ABNORMAL HIGH (ref 70–99)

## 2022-07-23 MED ORDER — INSULIN ASPART 100 UNIT/ML IJ SOLN
4.0000 [IU] | Freq: Three times a day (TID) | INTRAMUSCULAR | Status: DC
  Administered 2022-07-23 – 2022-07-26 (×6): 4 [IU] via SUBCUTANEOUS

## 2022-07-23 MED ORDER — ZOLPIDEM TARTRATE 5 MG PO TABS: 5.0000 mg | ORAL_TABLET | Freq: Every evening | ORAL | Status: DC | PRN

## 2022-07-23 MED ORDER — INSULIN ASPART 100 UNIT/ML IJ SOLN
0.0000 [IU] | Freq: Three times a day (TID) | INTRAMUSCULAR | Status: DC
  Administered 2022-07-23: 4 [IU] via SUBCUTANEOUS
  Administered 2022-07-23 (×2): 3 [IU] via SUBCUTANEOUS
  Administered 2022-07-24 – 2022-07-25 (×3): 4 [IU] via SUBCUTANEOUS
  Administered 2022-07-26: 3 [IU] via SUBCUTANEOUS

## 2022-07-23 MED ORDER — SODIUM CHLORIDE 0.9% FLUSH: 3.0000 mL | INTRAVENOUS | Status: DC | PRN

## 2022-07-23 MED ORDER — ORAL CARE MOUTH RINSE: 15.0000 mL | OROMUCOSAL | Status: DC | PRN

## 2022-07-23 MED ORDER — CLOPIDOGREL BISULFATE 75 MG PO TABS
75.0000 mg | ORAL_TABLET | Freq: Every day | ORAL | Status: DC
  Administered 2022-07-24 – 2022-07-26 (×3): 75 mg via ORAL
  Filled 2022-07-23 (×3): qty 1

## 2022-07-23 MED ORDER — SODIUM CHLORIDE 0.9 % IV SOLN: 250.0000 mL | INTRAVENOUS | Status: DC | PRN

## 2022-07-23 MED ORDER — DAPAGLIFLOZIN PROPANEDIOL 10 MG PO TABS
10.0000 mg | ORAL_TABLET | Freq: Every day | ORAL | Status: DC
  Administered 2022-07-23 – 2022-07-26 (×4): 10 mg via ORAL
  Filled 2022-07-23 (×4): qty 1

## 2022-07-23 MED ORDER — INSULIN DETEMIR 100 UNIT/ML ~~LOC~~ SOLN
25.0000 [IU] | Freq: Two times a day (BID) | SUBCUTANEOUS | Status: DC
  Administered 2022-07-23 – 2022-07-26 (×7): 25 [IU] via SUBCUTANEOUS
  Filled 2022-07-23 (×9): qty 0.25

## 2022-07-23 MED ORDER — MAGNESIUM HYDROXIDE 400 MG/5ML PO SUSP: 30.0000 mL | Freq: Every day | ORAL | Status: DC | PRN

## 2022-07-23 MED ORDER — SODIUM CHLORIDE 0.9% FLUSH
3.0000 mL | Freq: Two times a day (BID) | INTRAVENOUS | Status: DC
  Administered 2022-07-23 – 2022-07-26 (×6): 3 mL via INTRAVENOUS

## 2022-07-23 MED ORDER — ~~LOC~~ CARDIAC SURGERY, PATIENT & FAMILY EDUCATION: Freq: Once | Status: DC

## 2022-07-23 MED ORDER — INSULIN ASPART 100 UNIT/ML IJ SOLN: 0.0000 [IU] | Freq: Three times a day (TID) | INTRAMUSCULAR | Status: DC

## 2022-07-23 MED ORDER — POTASSIUM CHLORIDE CRYS ER 20 MEQ PO TBCR
20.0000 meq | EXTENDED_RELEASE_TABLET | ORAL | Status: AC
  Administered 2022-07-23 (×3): 20 meq via ORAL
  Filled 2022-07-23 (×3): qty 1

## 2022-07-23 MED ORDER — ALUM & MAG HYDROXIDE-SIMETH 200-200-20 MG/5ML PO SUSP: 15.0000 mL | Freq: Four times a day (QID) | ORAL | Status: DC | PRN

## 2022-07-23 MED ORDER — INSULIN ASPART 100 UNIT/ML IJ SOLN
0.0000 [IU] | Freq: Every day | INTRAMUSCULAR | Status: DC
  Administered 2022-07-24: 2 [IU] via SUBCUTANEOUS

## 2022-07-23 MED ORDER — FUROSEMIDE 40 MG PO TABS
40.0000 mg | ORAL_TABLET | Freq: Every day | ORAL | Status: DC
  Administered 2022-07-23 – 2022-07-26 (×4): 40 mg via ORAL
  Filled 2022-07-23 (×4): qty 1

## 2022-07-23 NOTE — Plan of Care (Signed)
  Problem: Health Behavior/Discharge Planning: Goal: Ability to manage health-related needs will improve Outcome: Progressing   

## 2022-07-23 NOTE — Hospital Course (Addendum)
History of Present Illness: Alisha Williams is a 67 year old woman with a history of obesity, poorly controlled type 2 diabetes, hypothyroidism, anxiety, depression, and anemia.  She presented with a 1 week history of substernal chest pain with radiation to her throat accompanied by headache, nausea, and shortness of breath with exertion.  Over the course of the week her chest pain progressed and she also noted orthopnea, bloating, and early satiety.  She saw her primary and mention chest pain.  An ECG was concerning for an acute coronary syndrome and she was transferred to the ED where she was diagnosed with a non-ST elevation MI with a troponin of 2338.  Noted to be in heart failure with a BNP of 218.Marland Kitchen  Also noted to be in DKA.  She was transferred to [facility] for further evaluation.   Three-vessel coronary disease status post non-ST elevation MI-coronary bypass grafting is indicated for survival benefit and relief of symptoms.  She is relatively high risk due to her obesity, diffusely diseased coronaries, and poorly controlled diabetes.   I discussed coronary artery bypass grafting with Alisha Williams.  I informed her of the general nature of the procedure including the need for general anesthesia, the incisions to be used, the use of cardiopulmonary bypass, the use of drainage tubes and temporary pacemaker wires postoperatively, the expected hospital stay, and the overall recovery.  I informed her of the indications, risks, benefits, and alternatives.  She understands the risks include, but not limited to death, MI, stroke, DVT, PE, bleeding, possible need for transfusion, infection, cardiac arrhythmias, respiratory or renal failure, as well as the possibility of other unforeseeable complications.   She accepts the risks and wishes to proceed with surgery.   Type 2 diabetes-poorly controlled.  DKA on admission.  Issues with access to medications.  Hemoglobin A1c 11.2 on admission.  Needs optimization prior to  surgery.   Hypothyroidism-TSH elevated.  On Synthroid.  Hospital Course: Alisha Williams remained stable after transfer to [facility].  She was diureses and heart failure optimized. She was treated for a suspected UTI.   Alisha Williams was taken to the OR on 07/21/22 where CABG x 4 was performed without complication.  She separated from cardiopulmonary bypass without difficulty on Neo-synephrine and was transferred to the surgical ICU in stable condition. Hemodynamics and cardiac rhythm were stable. She was extubated on the evening of the day of surgery. On post-op day 1, the chest tube and monitoring lines were removed and she was mobilized. Diet was advanced and well tolerated. She was ready for transfer to [facility] on post-op day 2. Pacer wires were removed and she was started on Plavix for the acute coronary syndrome on initial presentation. She was given lactulose for constipation and her bowels began moving. Her incisions were healing well without sign of infection. She was saturating well on room air with ambulation. Her preoperative A1C was 11.2, her insurance would not cover Victoza and she would require home insulin. The diabetes coordinator recommended Basaglar (Glargine) 25 units [place] BID and Novolog 5 units [place] TID with meals, this was arranged accordingly. She was felt stable for discharge home.

## 2022-07-23 NOTE — Progress Notes (Signed)
2 Days Post-Op Procedure(s) (LRB): CORONARY ARTERY BYPASS GRAFTING (CABG) TIMES FOUR USING LEFT INTERNAL MAMMARY AND LEFT SAPHENOUS VEIN (N/A) TRANSESOPHAGEAL ECHOCARDIOGRAM (N/A) ENDOVEIN HARVEST OF GREATER SAPHENOUS VEIN (Left) Subjective: No complaints this AM  Objective: Vital signs in last 24 hours: Temp:  [98.3 F (36.8 C)-99.3 F (37.4 C)] 98.3 F (36.8 C) (06/28 0345) Pulse Rate:  [70-108] 83 (06/28 0700) Cardiac Rhythm: Normal sinus rhythm (06/28 0400) Resp:  [7-31] 16 (06/28 0700) BP: (72-120)/(33-90) 102/61 (06/28 0630) SpO2:  [76 %-100 %] 96 % (06/28 0700) Arterial Line BP: (84-141)/(38-82) 106/46 (06/28 0700) Weight:  [161 kg] 107 kg (06/28 0500)  Hemodynamic parameters for last 24 hours: PAP: (29-40)/(14-24) 33/17  Intake/Output from previous day: 06/27 0701 - 06/28 0700 In: 746.9 [I.V.:408.6; IV Piggyback:338.3] Out: 1760 [Urine:1680; Chest Tube:80] Intake/Output this shift: No intake/output data recorded.  General appearance: alert, cooperative, and no distress Neurologic: intact Heart: regular rate and rhythm Lungs: diminished breath sounds bibasilar Abdomen: normal findings: soft, non-tender  Lab Results: Recent Labs    07/22/22 1733 07/23/22 0415  WBC 6.0 6.1  HGB 9.4* 9.1*  HCT 30.3* 29.5*  PLT 152 149*   BMET:  Recent Labs    07/22/22 1733 07/23/22 0415  NA 134* 133*  K 3.8 3.7  CL 104 102  CO2 21* 24  GLUCOSE 182* 149*  BUN 10 9  CREATININE 0.75 0.62  CALCIUM 7.9* 7.9*    PT/INR:  Recent Labs    07/21/22 1341  LABPROT 16.3*  INR 1.3*   ABG    Component Value Date/Time   PHART 7.315 (L) 07/21/2022 1954   HCO3 21.3 07/21/2022 1954   TCO2 23 07/21/2022 1954   ACIDBASEDEF 4.0 (H) 07/21/2022 1954   O2SAT 98 07/21/2022 1954   CBG (last 3)  Recent Labs    07/22/22 1925 07/22/22 2333 07/23/22 0351  GLUCAP 175* 183* 160*    Assessment/Plan: S/P Procedure(s) (LRB): CORONARY ARTERY BYPASS GRAFTING (CABG) TIMES FOUR  USING LEFT INTERNAL MAMMARY AND LEFT SAPHENOUS VEIN (N/A) TRANSESOPHAGEAL ECHOCARDIOGRAM (N/A) ENDOVEIN HARVEST OF GREATER SAPHENOUS VEIN (Left) POD # 2 NEURO- intact CV- in SR, BP a little low at times but overall Ok  ASA, start Plavix tomorrow  Beta blocker, statin  Dc pacing wires, A line RESP- atelectasis- IS RENAL- creatinine OK  Fluid overload- diurese  Supplement K GI- tolerating diet ENDO- CBG much better controlled but still not optimal  Resume Farxiga  Continue Levemir, add meal coverage  SSi Ac and HS Anemia secondary to ABL- stable, monitor SCD + enoxaparin for DVT prophylaxis Transfer to 4E    LOS: 10 days    Loreli Slot 07/23/2022

## 2022-07-23 NOTE — Discharge Summary (Signed)
Physician Discharge Summary  Patient ID: Alisha Williams MRN: [id number] DOB/AGE: 66-Oct-1957 66 y.o.  Admit date: 07/13/2022 Discharge date: 08/02/2022  Admission Diagnoses: Acute NSTEMI Type 2 diabetes mellitus Diabetic ketoacidosis Hypothyroidism H/O anxiety and depression  Discharge Diagnoses:   Acute NSTEMI Type 2 diabetes mellitus Diabetic ketoacidosis Hypothyroidism H/O anxiety and depression S/P CABG x 4 Expected acute blood loss anemia Thrombocytopenia  Discharged Condition: stable  History of Present Illness: Alisha Williams is a 67 year old woman with a history of obesity, poorly controlled type 2 diabetes, hypothyroidism, anxiety, depression, and anemia.  She presented with a 1 week history of substernal chest pain with radiation to her throat accompanied by headache, nausea, and shortness of breath with exertion.  Over the course of the week her chest pain progressed and she also noted orthopnea, bloating, and early satiety.  She saw her primary and mention chest pain.  An ECG was concerning for an acute coronary syndrome and she was transferred to the ED where she was diagnosed with a non-ST elevation MI with a troponin of 2338.  Noted to be in heart failure with a BNP of 218.Marland Kitchen  Also noted to be in DKA.  She was transferred to [facility] for further evaluation.   Three-vessel coronary disease status post non-ST elevation MI-coronary bypass grafting is indicated for survival benefit and relief of symptoms.  She is relatively high risk due to her obesity, diffusely diseased coronaries, and poorly controlled diabetes.   I discussed coronary artery bypass grafting with Alisha Williams.  I informed her of the general nature of the procedure including the need for general anesthesia, the incisions to be used, the use of cardiopulmonary bypass, the use of drainage tubes and temporary pacemaker wires postoperatively, the expected hospital stay, and the overall recovery.  I informed her of the  indications, risks, benefits, and alternatives.  She understands the risks include, but not limited to death, MI, stroke, DVT, PE, bleeding, possible need for transfusion, infection, cardiac arrhythmias, respiratory or renal failure, as well as the possibility of other unforeseeable complications.   She accepts the risks and wishes to proceed with surgery.   Type 2 diabetes-poorly controlled.  DKA on admission.  Issues with access to medications.  Hemoglobin A1c 11.2 on admission.  Needs optimization prior to surgery.   Hypothyroidism-TSH elevated.  On Synthroid.  Hospital Course: Alisha Williams remained stable after transfer to [facility].  She was diureses and heart failure optimized. She was treated for a suspected UTI.   Alisha Williams was taken to the OR on 07/21/22 where CABG x 4 was performed without complication.  She separated from cardiopulmonary bypass without difficulty on Neo-synephrine and was transferred to the surgical ICU in stable condition. Hemodynamics and cardiac rhythm were stable. She was extubated on the evening of the day of surgery. On post-op day 1, the chest tube and monitoring lines were removed and she was mobilized. Diet was advanced and well tolerated. She was ready for transfer to 4E [facility] on post-op day 2. Pacer wires were removed and she was started on Plavix for the acute coronary syndrome on initial presentation. She was given lactulose for constipation and her bowels began moving. Her incisions were healing well without sign of infection. She was saturating well on room air with ambulation. Her preoperative A1C was 11.2, her insurance would not cover Victoza and she would require home insulin. The diabetes coordinator recommended Basaglar (Glargine) 25 units [place] BID and Novolog 5 units  TID with meals,  this was arranged accordingly. She was felt stable for discharge home.   Consults:  Diabetes Coordinator  Significant Diagnostic Studies:   CLINICAL  DATA:  Status post CABG   EXAM: PORTABLE CHEST 1 VIEW   COMPARISON:  Chest radiograph dated 07/24/2022   FINDINGS: Lines/tubes: None.   Lungs: Improved left lower lung aeration with minimal residual patchy opacities.   Pleura: No pneumothorax or pleural effusion.   Heart/mediastinum: Similar mildly enlarged, postsurgical cardiomediastinal silhouette.   Bones: Median sternotomy wires are nondisplaced.   IMPRESSION: 1. Improved left lower lung aeration with minimal residual patchy opacities, likely atelectasis. 2. Similar cardiomegaly.     Electronically Signed   By: Agustin Cree M.D.   On: 07/26/2022 09:06    Treatments: surgery  DATE OF PROCEDURE: 07/21/2022   PREOPERATIVE DIAGNOSIS:  Three-vessel coronary disease, status post nonST elevation myocardial infarction.   POSTOPERATIVE DIAGNOSIS:  Three-vessel coronary disease, status post nonST elevation myocardial infarction.   PROCEDURE PERFORMED:  Median sternotomy, extracorporeal circulation, coronary artery bypass grafting x4 (left internal mammary artery to LAD, saphenous vein graft to first diagonal, sequential saphenous vein graft to obtuse marginal 1 and  posterolateral), endoscopic vein harvest, left leg.   SURGEON:  Salvatore Decent. Dorris Fetch, MD   ASSISTANT:  Jillyn Hidden, PA   ANESTHESIA:  General.   FINDINGS:  Transesophageal echocardiography showed ejection fraction of approximately 35% with mild to moderate mitral regurgitation prebypass, improved left ventricular wall motion with ejection fraction of approximately 50% with mild mitral  regurgitation post-bypass.  OM and PL poor quality targets.  LAD and diagonal fair quality targets.  Posterior descending ungraftable, mammary artery and saphenous vein good quality.  Discharge Exam: Blood pressure 113/69, pulse 78, temperature (!) 97.5 F (36.4 C), temperature source Oral, resp. rate 18, height 5\' 8"  (1.727 m), weight 100.2 kg, SpO2 94 %.  General  appearance: alert, cooperative, and no distress Neurologic: intact Heart: regular rate and rhythm, no arrhythmias on monitor review Lungs: clear to auscultation bilaterally Abdomen: soft, non-tender Extremities: no LE edema Wounds: Clean and dry    Disposition: Discharge disposition: 01-Home or Self Care     Discharged to home in stable condition  Discharge Instructions     AMB Referral to Advanced Lipid Disorders Clinic   Complete by: As directed    Internal Lipid Clinic Referral Scheduling  Internal lipid clinic referrals are providers within [facility], who wish to refer established patients for routine management (help in starting PCSK9 inhibitor therapy) or advanced therapies.  Internal MD referral criteria:              1. All patients with LDL>190 mg/dL  2. All patients with Triglycerides >500 mg/dL  3. Patients with suspected or confirmed heterozygous familial hyperlipidemia (HeFH) or homozygous familial hyperlipidemia (HoFH)  4. Patients with family history of suspicious for genetic dyslipidemia desiring genetic testing  5. Patients refractory to standard guideline based therapy  6. Patients with statin intolerance (failed 2 statins, one of which must be a high potency statin)  7. Patients who the provider desires to be seen by MD   Internal PharmD referral criteria:   1. Follow-up patients for medication management  2. Follow-up for compliance monitoring  3. Patients for drug education  4. Patients with statin intolerance  5. PCSK9 inhibitor education and prior authorization approvals  6. Patients with triglycerides <500 mg/dL  External Lipid Clinic Referral  External lipid clinic referrals are for providers outside of [facility], considered new clinic patients -  automatically routed to MD schedule   Amb Referral to Cardiac Rehabilitation   Complete by: As directed    Diagnosis:  CABG NSTEMI     CABG X ___: 4   After initial evaluation and assessments  completed: Virtual Based Care may be provided alone or in conjunction with Phase 2 Cardiac Rehab based on patient barriers.: Yes   Intensive Cardiac Rehabilitation (ICR) [facility] location only OR Traditional Cardiac Rehabilitation (TCR) *If criteria for ICR are not met will enroll in TCR [facility] only): Yes      Allergies as of 07/26/2022       Reactions   Metformin And Related Other (See Comments)   Weakness & fatigue        Medication List     STOP taking these medications    cyclobenzaprine 5 MG tablet Commonly known as: FLEXERIL   metFORMIN 500 MG 24 hr tablet Commonly known as: GLUCOPHAGE-XR   traMADol 50 MG tablet Commonly known as: ULTRAM   Victoza 18 MG/3ML Sopn Generic drug: liraglutide       TAKE these medications    acetaminophen 325 MG tablet Commonly known as: Tylenol Take 2 tablets (650 mg total) by mouth every 6 (six) hours as needed.   aspirin EC 81 MG tablet Take 1 tablet (81 mg total) by mouth daily.   atorvastatin 80 MG tablet Commonly known as: LIPITOR Take 1 tablet (80 mg total) by mouth daily.   CINNAMON PO Take by mouth.   clopidogrel 75 MG tablet Commonly known as: Plavix Take 1 tablet (75 mg total) by mouth daily.   Farxiga 10 MG Tabs tablet Generic drug: dapagliflozin propanediol Take 10 mg by mouth daily.   ferrous sulfate 325 (65 FE) MG tablet Take 325 mg by mouth daily with breakfast.   FreeStyle Libre 3 Sensor Misc 1 Device by Does not apply route continuous. Place 1 sensor on the skin every 14 days. Use to check glucose continuously   Dexcom G7 Sensor Misc 1 Device by Does not apply route continuous.   guaiFENesin 600 MG 12 hr tablet Commonly known as: MUCINEX Take 1 tablet (600 mg total) by mouth 2 (two) times daily as needed for cough or to loosen phlegm.   HumaLOG KwikPen 100 UNIT/ML KwikPen Generic drug: insulin lispro Inject 5 Units into the skin with breakfast, with lunch, and with evening meal.   KELP PO Take by  mouth.   levothyroxine 125 MCG tablet Commonly known as: SYNTHROID Take 125 mcg by mouth daily before breakfast.   metoprolol tartrate 25 MG tablet Commonly known as: LOPRESSOR Take 1/2 tablet (12.5 mg total) by mouth 2 (two) times daily.   MULTIPLE VITAMIN PO Take 1 tablet by mouth daily.   OneTouch Delica Lancets Fine Misc by Does not apply route. Use as instructed   OneTouch Verio test strip Generic drug: glucose blood 1 each by Other route as needed for other. Use as instructed   oxyCODONE 5 MG immediate release tablet Commonly known as: Oxy IR/ROXICODONE Take 1 tablet (5 mg total) by mouth every 6 (six) hours as needed for severe pain.   Semglee (yfgn) 100 UNIT/ML Pen Generic drug: insulin glargine-yfgn Inject 25 Units into the skin 2 (two) times daily.   TechLite Plus Pen Needles 32G X 4 MM Misc Generic drug: Insulin Pen Needle Use two times a day.        Follow-up Information     Loreli Slot, MD. Go on 08/31/2022.   Specialty: Cardiothoracic  Surgery Why: Your appointment is at 11:30AM Please obtain a chest x-ray 1 hour before the appointment at [facility] located at [address] Contact information: [address] [employer] [address] [place] [address] [phone]         [facility]. Go on 08/31/2022.   Why: Please obtain a chest x-ray at this location at 10:30AM, 1 hour before your appointment with Dr. Dorris Fetch. Contact information: [address] [place] [address]        Carlos Levering, NP. Go on 08/17/2022.   Specialty: Cardiology Why: Your appointment is at 10:55am. Contact information: [address] [place] [address] [phone]         Roma Kayser, MD. Go on 09/06/2022.   Specialty: Endocrinology Why: Your appointment evaluation of diabetes and hypothyroid management is at 9am. Contact information: [address] [place]  [address] [phone]                 The patient has been discharged on:   1.Beta Blocker:  Yes [ x ]                              No   [   ]                              If No, reason:  2.Ace Inhibitor/ARB: Yes [   ]                                     No  [    ]                                     If No, reason:  3.Statin:   Yes [ x ]                    No  [   ]                  If No, reason:  4.Ecasa:  Yes  [ x ]                  No   [   ]                  If No, reason:  5. ACS on Admission? Yes  P2Y12 Inhibitor:  Yes  [ x ]                                No  [  ]    Signed: Leary Roca, PA-C 08/02/2022, 4:08 PM

## 2022-07-24 ENCOUNTER — Inpatient Hospital Stay (HOSPITAL_COMMUNITY): Payer: 59

## 2022-07-24 LAB — TYPE AND SCREEN
ABO/RH(D): O POS
Antibody Screen: NEGATIVE
Unit division: 0
Unit division: 0
Unit division: 0

## 2022-07-24 LAB — BPAM RBC
Blood Product Expiration Date: 202407242359
Blood Product Expiration Date: 202407242359
Blood Product Expiration Date: 202407242359
ISSUE DATE / TIME: 202406260850
Unit Type and Rh: 5100
Unit Type and Rh: 5100

## 2022-07-24 LAB — CBC
HCT: 29.8 % — ABNORMAL LOW (ref 36.0–46.0)
Hemoglobin: 9.3 g/dL — ABNORMAL LOW (ref 12.0–15.0)
MCH: 26.6 pg (ref 26.0–34.0)
MCHC: 31.2 g/dL (ref 30.0–36.0)
MCV: 85.1 fL (ref 80.0–100.0)
Platelets: 172 10*3/uL (ref 150–400)
RBC: 3.5 MIL/uL — ABNORMAL LOW (ref 3.87–5.11)
RDW: 14.4 % (ref 11.5–15.5)
WBC: 7.1 10*3/uL (ref 4.0–10.5)
nRBC: 0 % (ref 0.0–0.2)

## 2022-07-24 LAB — BASIC METABOLIC PANEL
Anion gap: 8 (ref 5–15)
BUN: 11 mg/dL (ref 8–23)
CO2: 25 mmol/L (ref 22–32)
Calcium: 8.3 mg/dL — ABNORMAL LOW (ref 8.9–10.3)
Chloride: 103 mmol/L (ref 98–111)
Creatinine, Ser: 0.68 mg/dL (ref 0.44–1.00)
GFR, Estimated: >60 mL/min (ref >60)
Glucose, Bld: 122 mg/dL — ABNORMAL HIGH (ref 70–99)
Potassium: 4.2 mmol/L (ref 3.5–5.1)
Sodium: 136 mmol/L (ref 135–145)

## 2022-07-24 LAB — GLUCOSE, CAPILLARY
Glucose-Capillary: 100 mg/dL — ABNORMAL HIGH (ref 70–99)
Glucose-Capillary: 176 mg/dL — ABNORMAL HIGH (ref 70–99)
Glucose-Capillary: 94 mg/dL (ref 70–99)
Glucose-Capillary: 203 mg/dL — ABNORMAL HIGH (ref 70–99)

## 2022-07-24 MED ORDER — ASPIRIN 325 MG PO TBEC
325.0000 mg | DELAYED_RELEASE_TABLET | Freq: Every day | ORAL | Status: DC
  Administered 2022-07-24 – 2022-07-25 (×2): 325 mg via ORAL
  Filled 2022-07-24 (×2): qty 1

## 2022-07-24 MED ORDER — POTASSIUM CHLORIDE CRYS ER 20 MEQ PO TBCR
20.0000 meq | EXTENDED_RELEASE_TABLET | Freq: Every day | ORAL | Status: DC
  Administered 2022-07-24 – 2022-07-26 (×3): 20 meq via ORAL
  Filled 2022-07-24 (×3): qty 1

## 2022-07-24 MED ORDER — ASPIRIN 81 MG PO CHEW
324.0000 mg | CHEWABLE_TABLET | Freq: Every day | ORAL | Status: DC
  Administered 2022-07-26: 324 mg
  Filled 2022-07-24: qty 4

## 2022-07-24 MED ORDER — ASPIRIN 81 MG PO TBEC: 81.0000 mg | DELAYED_RELEASE_TABLET | Freq: Every day | ORAL | Status: DC

## 2022-07-24 MED ORDER — LACTULOSE 10 GM/15ML PO SOLN
20.0000 g | Freq: Once | ORAL | Status: AC
  Administered 2022-07-24: 20 g via ORAL
  Filled 2022-07-24: qty 30

## 2022-07-24 MED ORDER — ASPIRIN 81 MG PO CHEW: 324.0000 mg | CHEWABLE_TABLET | Freq: Every day | ORAL | Status: DC

## 2022-07-24 NOTE — Progress Notes (Signed)
Mobility Specialist: Progress Note   07/24/22 1221  Mobility  Activity Ambulated with assistance in hallway  Level of Assistance Contact guard assist, steadying assist  Assistive Device None  Distance Ambulated (ft) 430 ft  RUE Weight Bearing NWB  LUE Weight Bearing NWB  Activity Response Tolerated well  Mobility Referral Yes  $Mobility charge 1 Mobility  Mobility Specialist Start Time (ACUTE ONLY) 1156  Mobility Specialist Stop Time (ACUTE ONLY) 1220  Mobility Specialist Time Calculation (min) (ACUTE ONLY) 24 min   Post-Mobility: 87 HR, 130/77 BP  Pt received sitting EOB and agreeable to mobility. Mod I to stand and contact guard during ambulation. C/o general fatigue, otherwise asymptomatic. Pt sitting EOB after session with call bell and phone in reach. Family present in the room.   Christen Wardrop Mobility Specialist Please contact via SecureChat or Rehab office at [phone]

## 2022-07-24 NOTE — Progress Notes (Signed)
Patient does not have epicardial pacing wires at this time. None to remove. [facility] made aware. Elizbeth Posa, Randall An  RN

## 2022-07-24 NOTE — Progress Notes (Addendum)
      [address]       [place] [address]             [phone]      3 Days Post-Op Procedure(s) (LRB): CORONARY ARTERY BYPASS GRAFTING (CABG) TIMES FOUR USING LEFT INTERNAL MAMMARY AND LEFT SAPHENOUS VEIN (N/A) TRANSESOPHAGEAL ECHOCARDIOGRAM (N/A) ENDOVEIN HARVEST OF GREATER SAPHENOUS VEIN (Left) Subjective: Pt with some sternal soreness with coughing, otherwise no new complaints.   Objective: Vital signs in last 24 hours: Temp:  [97.7 F (36.5 C)-99 F (37.2 C)] 97.7 F (36.5 C) (06/29 0741) Pulse Rate:  [81-89] 89 (06/29 0741) Cardiac Rhythm: Normal sinus rhythm (06/28 2001) Resp:  [5-27] 18 (06/29 0741) BP: (91-116)/(48-77) 108/67 (06/29 0741) SpO2:  [92 %-97 %] 95 % (06/29 0741) Weight:  [104.4 kg] 104.4 kg (06/29 0621)  Hemodynamic parameters for last 24 hours:    Intake/Output from previous day: 06/28 0701 - 06/29 0700 In: 240 [P.O.:240] Out: -  Intake/Output this shift: No intake/output data recorded.  General appearance: alert, cooperative, and no distress Neurologic: intact Heart: regular rate and rhythm, S1, S2 normal, no murmur, click, rub or gallop Lungs: Diminished bibasilar breath sounds Abdomen: soft, non-tender; bowel sounds normal; no masses,  no organomegaly Extremities: edema trace Wound: Clean and dry no erythema or sign of infection  Lab Results: Recent Labs    07/23/22 0415 07/24/22 0109  WBC 6.1 7.1  HGB 9.1* 9.3*  HCT 29.5* 29.8*  PLT 149* 172   BMET:  Recent Labs    07/23/22 0415 07/24/22 0109  NA 133* 136  K 3.7 4.2  CL 102 103  CO2 24 25  GLUCOSE 149* 122*  BUN 9 11  CREATININE 0.62 0.68  CALCIUM 7.9* 8.3*    PT/INR:  Recent Labs    07/21/22 1341  LABPROT 16.3*  INR 1.3*   ABG    Component Value Date/Time   PHART 7.315 (L) 07/21/2022 1954   HCO3 21.3 07/21/2022 1954   TCO2 23 07/21/2022 1954   ACIDBASEDEF 4.0 (H) 07/21/2022 1954   O2SAT 98 07/21/2022 1954   CBG (last 3)  Recent Labs     07/23/22 1620 07/23/22 2056 07/24/22 0548  GLUCAP 141* 121* 100*    Assessment/Plan: S/P Procedure(s) (LRB): CORONARY ARTERY BYPASS GRAFTING (CABG) TIMES FOUR USING LEFT INTERNAL MAMMARY AND LEFT SAPHENOUS VEIN (N/A) TRANSESOPHAGEAL ECHOCARDIOGRAM (N/A) ENDOVEIN HARVEST OF GREATER SAPHENOUS VEIN (Left)  CV: Hx of NSTEMI, will start Plavix today. BP controlled on Lopressor 12.5mg  BID. NSR, HR 80s-90s.  Pulm: Saturating well on RA. Bibasilar atelectasis and possible small bilateral pleural effusions. Encourage IS and ambulation, continue Lasix.   GI: -BM, passing gas. OK appetite.   Endo: CBGs controlled on Farxiga, Levemir, and SSI. Hx of DM, preop A1c 11.2. Pt to follow up with PCP. Hx of hypothyroid, on Synthroid.   Renal: No UO recorded. Cr 0.68. +8lbs from preop weight but -5lbs overnight. Continue Lasix 40mg  daily.  Expected postop ABLA: Trending up, H/H 9.3/29.8  DVT Prophylaxis: Lovenox, ambulation  Dispo: Hopefully d/c next 48 hours.    LOS: 11 days    Jenny Reichmann, PA-C 07/24/2022 Patient seen and examined, agree with above Looks great, ambulated 380' this AM No BM yet- lactulose  Viviann Spare C. Dorris Fetch, MD [facility] [phone]

## 2022-07-24 NOTE — Progress Notes (Signed)
CARDIAC REHAB PHASE I   PRE:  Rate/Rhythm: 88 SR  BP:  Supine: 118/75 Sitting:   Standing:    SaO2: 97% RA  MODE:  Ambulation: 380 ft   POST:  Rate/Rhythm: 105 ST  BP:  Supine:   Sitting: 136/79  Standing:    SaO2: 96% RA   845-948 Patient tolerated ambulation well with assist x1. Multpile standing rest breaks taken secondary to SOB, feeling wobbly on feet. Resolved with rest. Patient states she feels less wobbly than yesterday. O[facility] education completed including sternal precautions, IS use, daily weights and calling 911, calling physician, restrictions, risk factor modification, and activity progression. Heart healthy diet, diabetic diet, and exercise guidelines handouts given. Instructed patient on viewing the recovering from OHS video and reviewed safe movement in the tube handout. Discussed phase 2 cardiac rehab, and patient is interested in the program at [facility], referral entered in [place].  Artist Pais, MS, ACSM CEP

## 2022-07-24 NOTE — Progress Notes (Signed)
Patient ambulated in hallway with spouse. Lon Klippel Jessup RN  

## 2022-07-25 LAB — BASIC METABOLIC PANEL
Anion gap: 11 (ref 5–15)
BUN: 9 mg/dL (ref 8–23)
CO2: 23 mmol/L (ref 22–32)
Calcium: 8.4 mg/dL — ABNORMAL LOW (ref 8.9–10.3)
Chloride: 101 mmol/L (ref 98–111)
Creatinine, Ser: 0.57 mg/dL (ref 0.44–1.00)
GFR, Estimated: >60 mL/min (ref >60)
Glucose, Bld: 129 mg/dL — ABNORMAL HIGH (ref 70–99)
Potassium: 3.7 mmol/L (ref 3.5–5.1)
Sodium: 135 mmol/L (ref 135–145)

## 2022-07-25 LAB — CBC
HCT: 29.4 % — ABNORMAL LOW (ref 36.0–46.0)
Hemoglobin: 9.2 g/dL — ABNORMAL LOW (ref 12.0–15.0)
MCH: 26.4 pg (ref 26.0–34.0)
MCHC: 31.3 g/dL (ref 30.0–36.0)
MCV: 84.5 fL (ref 80.0–100.0)
Platelets: 224 10*3/uL (ref 150–400)
RBC: 3.48 MIL/uL — ABNORMAL LOW (ref 3.87–5.11)
RDW: 14.2 % (ref 11.5–15.5)
WBC: 6.5 10*3/uL (ref 4.0–10.5)
nRBC: 0 % (ref 0.0–0.2)

## 2022-07-25 LAB — GLUCOSE, CAPILLARY
Glucose-Capillary: 116 mg/dL — ABNORMAL HIGH (ref 70–99)
Glucose-Capillary: 154 mg/dL — ABNORMAL HIGH (ref 70–99)
Glucose-Capillary: 174 mg/dL — ABNORMAL HIGH (ref 70–99)
Glucose-Capillary: 164 mg/dL — ABNORMAL HIGH (ref 70–99)

## 2022-07-25 MED ORDER — GUAIFENESIN ER 600 MG PO TB12
600.0000 mg | ORAL_TABLET | Freq: Two times a day (BID) | ORAL | Status: DC
  Administered 2022-07-25 – 2022-07-26 (×3): 600 mg via ORAL
  Filled 2022-07-25 (×3): qty 1

## 2022-07-25 NOTE — TOC Transition Note (Signed)
Transition of Care [facility]) - CM/SW Discharge Note   Patient Details  Name: Alisha Williams MRN: [id number] Date of Birth: [birth year]  Transition of Care [facility]) CM/SW Contact:  Norvel Richards, RN Phone Number: 07/25/2022, 1:32 PM   Clinical Narrative:  [facility] team for discharge planning. Met patient and spouse Leonette Most at bedside. Both are very pleasant. Patient explains that she has difficulty getting prescription meds, particularly Victoza because her spouse's insurance does not cover it. She voice having applied for Medicare because she turned 66 in December 2023. She denies having applied for a part D drug plan. Educated both patient and spouse about the need to do this ASAP and the penalty when not done. Provided her with resources to assist with Medicare and encourage to call on Monday. Both expressed understanding and asked clarifying question about the information provided. Discussed the doctor's proposed swith from Victoza to insulin. Patient shares she has seen better control of her diabetes since on insulin as an inpatient. Discussed the cost difference between Victoza and insulin. Provided patient with a Patient Assistance Program application if she is to continue on the drug post discharge. Will continue to follow for further discharge needs.    Final next level of care: Home/Self Care Barriers to Discharge: No Barriers Identified   Patient Goals and CMS Choice      Discharge Placement                         Discharge Plan and Services Additional resources added to the After Visit Summary for                                       Social Determinants of Health (SDOH) Interventions SDOH Screenings   Food Insecurity: No Food Insecurity (07/13/2022)  Housing: Low Risk  (07/13/2022)  Transportation Needs: No Transportation Needs (07/13/2022)  Utilities: Not At Risk (07/13/2022)  Tobacco Use: Unknown (07/22/2022)     Readmission Risk Interventions     No  data to display

## 2022-07-25 NOTE — Progress Notes (Signed)
Mobility Specialist Progress Note:    07/25/22 1205  Mobility  Activity Ambulated independently in hallway  Level of Assistance Standby assist, set-up cues, supervision of patient - no hands on  Assistive Device None  Distance Ambulated (ft) 430 ft  Activity Response Tolerated well  Mobility Referral Yes  $Mobility charge 1 Mobility  Mobility Specialist Start Time (ACUTE ONLY) 1152  Mobility Specialist Stop Time (ACUTE ONLY) 1200  Mobility Specialist Time Calculation (min) (ACUTE ONLY) 8 min   Received pt exiting BR having no complaints and agreeable to mobility. Pt was asymptomatic throughout ambulation and returned to room w/o fault. Left seated EOB w/ call bell in reach and all needs met.   Thompson Grayer Mobility Specialist  Please contact vis Secure Chat or  Rehab Office [phone]

## 2022-07-25 NOTE — Plan of Care (Signed)

## 2022-07-25 NOTE — Progress Notes (Addendum)
[address]       [place] [address]             [phone]      4 Days Post-Op Procedure(s) (LRB): CORONARY ARTERY BYPASS GRAFTING (CABG) TIMES FOUR USING LEFT INTERNAL MAMMARY AND LEFT SAPHENOUS VEIN (N/A) TRANSESOPHAGEAL ECHOCARDIOGRAM (N/A) ENDOVEIN HARVEST OF GREATER SAPHENOUS VEIN (Left) Subjective: Patient admits to difficulty coughing up sputum this AM.   Objective: Vital signs in last 24 hours: Temp:  [97.7 F (36.5 C)-98.6 F (37 C)] 98.6 F (37 C) (06/30 0335) Pulse Rate:  [82-94] 82 (06/30 0335) Cardiac Rhythm: Normal sinus rhythm (06/29 2006) Resp:  [3-20] 3 (06/30 0500) BP: (100-118)/(53-93) 100/53 (06/30 0335) SpO2:  [95 %-100 %] 96 % (06/30 0335) Weight:  [101.3 kg] 101.3 kg (06/30 0500)  Hemodynamic parameters for last 24 hours:    Intake/Output from previous day: 06/29 0701 - 06/30 0700 In: 120 [P.O.:120] Out: -  Intake/Output this shift: No intake/output data recorded.  General appearance: alert, cooperative, and no distress Neurologic: intact Heart: regular rate and rhythm, S1, S2 normal, no murmur, click, rub or gallop Lungs: clear to auscultation bilaterally Abdomen: soft, non-tender; bowel sounds normal; no masses,  no organomegaly Extremities: edema trace Wound: Clean and dry, no erythema or sign of infection  Lab Results: Recent Labs    07/24/22 0109 07/25/22 0132  WBC 7.1 6.5  HGB 9.3* 9.2*  HCT 29.8* 29.4*  PLT 172 224   BMET:  Recent Labs    07/24/22 0109 07/25/22 0132  NA 136 135  K 4.2 3.7  CL 103 101  CO2 25 23  GLUCOSE 122* 129*  BUN 11 9  CREATININE 0.68 0.57  CALCIUM 8.3* 8.4*    PT/INR: No results for input(s): "LABPROT", "INR" in the last 72 hours. ABG    Component Value Date/Time   PHART 7.315 (L) 07/21/2022 1954   HCO3 21.3 07/21/2022 1954   TCO2 23 07/21/2022 1954   ACIDBASEDEF 4.0 (H) 07/21/2022 1954   O2SAT 98 07/21/2022 1954   CBG (last 3)  Recent Labs    07/24/22 1633  07/24/22 2058 07/25/22 0608  GLUCAP 94 203* 116*    Assessment/Plan: S/P Procedure(s) (LRB): CORONARY ARTERY BYPASS GRAFTING (CABG) TIMES FOUR USING LEFT INTERNAL MAMMARY AND LEFT SAPHENOUS VEIN (N/A) TRANSESOPHAGEAL ECHOCARDIOGRAM (N/A) ENDOVEIN HARVEST OF GREATER SAPHENOUS VEIN (Left)  CV: Hx of NSTEMI, on Plavix. SBP 91-118 recorded, but SBP 130s on mobility specialist and cardiac rehab notes. on Lopressor 12.5mg  BID. NSR, HR 80s-90s.   Pulm: Saturating well on RA. Bibasilar atelectasis and possible small bilateral pleural effusions. Difficulty coughing up sputum this AM, will add Mucinex. Encourage IS and ambulation.    GI: +BM. OK appetite.    Endo: CBGs 94/203/116 on Farxiga, Levemir, and SSI. Hx of DM, preop A1c 11.2. Pt will require close follow up with PCP, pt cannot afford Victoza, will discuss with TOC. Hx of hypothyroid, on Synthroid.    Renal: No UO recorded. Cr 0.57. At preop weight, will discontinue Lasix at discharge.    Expected postop ABLA: Stable, H/H 9.2/29.4   DVT Prophylaxis: Lovenox, ambulation   Dispo: Plan to D/C home today after discussing case with TOC for assistance with DM medications and follow ups.    LOS: 12 days    Jenny Reichmann, PA-C 07/25/2022  Patient seen and examined, agree with above Will need insulin at home Diabetes coordinator to see.  Dc once plan in place  for diabetes  Salvatore Decent. Dorris Fetch, MD [facility] [phone]

## 2022-07-25 NOTE — Inpatient Diabetes Management (Signed)
Inpatient Diabetes Program Recommendations  AACE/ADA: New Consensus Statement on Inpatient Glycemic Control (2015)  Target Ranges:  Prepandial:   less than 140 mg/dL      Peak postprandial:   less than 180 mg/dL (1-2 hours)      Critically ill patients:  140 - 180 mg/dL   Lab Results  Component Value Date   GLUCAP 154 (H) 07/25/2022   HGBA1C 11.2 (H) 07/13/2022    Review of Glycemic Control  Diabetes history: DM 2 Outpatient Diabetes medications: Metformin 1500 mg every day, Victoza 1.2 mg every day, Farxiga 10 mg QD  Current orders for Inpatient glycemic control:  Novolog 0-20 units tid + hs Novolog 4 units tid meal coverage Farxiga 10 mg Daily Levemir 25 units bid  Inpatient Diabetes Program Plan:    -  Run benefits check on CGMs tomorrow -  Run benefits check on basal insulins tomorrow -  Give CGM to pt tomorrow prior to d/c  Discharge Recommendations: Other recommendations: may substitute the equivalent basal insulin covered under insurance plan Long acting recommendations: Insulin Glargine-yfgn (SEMGLEE) Pen 25 units bid  Supply/Referral recommendations: Pen needles - standard Continuous glucose monitor Freestyle Libre 3 order # [id number], Dexcom G7 order # [phone]  Spoke with pt over the phone to reinforce education and go over the discharge planning for her medications.  Thanks,  Christena Deem RN, MSN, BC-ADM Inpatient Diabetes Coordinator Team Pager [phone] (8a-5p)

## 2022-07-26 ENCOUNTER — Inpatient Hospital Stay (HOSPITAL_COMMUNITY): Payer: 59

## 2022-07-26 ENCOUNTER — Other Ambulatory Visit (HOSPITAL_COMMUNITY): Payer: Self-pay

## 2022-07-26 ENCOUNTER — Telehealth (HOSPITAL_COMMUNITY): Payer: Self-pay | Admitting: Pharmacy Technician

## 2022-07-26 ENCOUNTER — Encounter (HOSPITAL_COMMUNITY): Payer: Self-pay | Admitting: Thoracic Surgery (Cardiothoracic Vascular Surgery)

## 2022-07-26 LAB — GLUCOSE, CAPILLARY
Glucose-Capillary: 126 mg/dL — ABNORMAL HIGH (ref 70–99)
Glucose-Capillary: 114 mg/dL — ABNORMAL HIGH (ref 70–99)
Glucose-Capillary: 119 mg/dL — ABNORMAL HIGH (ref 70–99)

## 2022-07-26 MED ORDER — DEXCOM G7 SENSOR MISC: 1.0000 | 0 refills | Status: AC

## 2022-07-26 MED ORDER — METOPROLOL TARTRATE 25 MG PO TABS
12.5000 mg | ORAL_TABLET | Freq: Two times a day (BID) | ORAL | 1 refills | Status: DC
  Filled 2022-07-26: qty 30, 30d supply, fill #0

## 2022-07-26 MED ORDER — INSULIN LISPRO (1 UNIT DIAL) 100 UNIT/ML (KWIKPEN)
5.0000 [IU] | PEN_INJECTOR | Freq: Three times a day (TID) | SUBCUTANEOUS | 3 refills | Status: AC
  Filled 2022-07-26: qty 3, 20d supply, fill #0

## 2022-07-26 MED ORDER — OXYCODONE HCL 5 MG PO TABS
5.0000 mg | ORAL_TABLET | Freq: Four times a day (QID) | ORAL | 0 refills | Status: DC | PRN
  Filled 2022-07-26: qty 28, 7d supply, fill #0

## 2022-07-26 MED ORDER — INSULIN PEN NEEDLE 32G X 4 MM MISC
1.0000 | Freq: Two times a day (BID) | 2 refills | Status: AC
  Filled 2022-07-26: qty 100, 30d supply, fill #0

## 2022-07-26 MED ORDER — FREESTYLE LIBRE 3 SENSOR MISC: 1.0000 | 0 refills | Status: AC

## 2022-07-26 MED ORDER — ACETAMINOPHEN 325 MG PO TABS: 650.0000 mg | ORAL_TABLET | Freq: Four times a day (QID) | ORAL | Status: AC | PRN

## 2022-07-26 MED ORDER — ATORVASTATIN CALCIUM 80 MG PO TABS
80.0000 mg | ORAL_TABLET | Freq: Every day | ORAL | 5 refills | Status: DC
  Filled 2022-07-26: qty 30, 30d supply, fill #0

## 2022-07-26 MED ORDER — ASPIRIN 81 MG PO TBEC
81.0000 mg | DELAYED_RELEASE_TABLET | Freq: Every day | ORAL | 3 refills | Status: DC
  Filled 2022-07-26: qty 120, 120d supply, fill #0

## 2022-07-26 MED ORDER — GUAIFENESIN ER 600 MG PO TB12: 600.0000 mg | ORAL_TABLET | Freq: Two times a day (BID) | ORAL | Status: AC | PRN

## 2022-07-26 MED ORDER — INSULIN ASPART 100 UNIT/ML FLEXPEN
5.0000 [IU] | PEN_INJECTOR | Freq: Three times a day (TID) | SUBCUTANEOUS | 3 refills | Status: DC
  Filled 2022-07-26: qty 15, 90d supply, fill #0

## 2022-07-26 MED ORDER — INSULIN GLARGINE-YFGN 100 UNIT/ML ~~LOC~~ SOPN
25.0000 [IU] | PEN_INJECTOR | Freq: Two times a day (BID) | SUBCUTANEOUS | 6 refills | Status: AC
  Filled 2022-07-26: qty 15, 30d supply, fill #0

## 2022-07-26 MED ORDER — LIVING WELL WITH DIABETES BOOK
Freq: Once | Status: AC
  Filled 2022-07-26: qty 1

## 2022-07-26 MED ORDER — CLOPIDOGREL BISULFATE 75 MG PO TABS
75.0000 mg | ORAL_TABLET | Freq: Every day | ORAL | 11 refills | Status: DC
  Filled 2022-07-26: qty 30, 30d supply, fill #0

## 2022-07-26 NOTE — TOC Benefit Eligibility Note (Signed)
Pharmacy Patient Advocate Encounter  Insurance verification completed.    The patient is insured through  [employer] for [employer] and the current 30 day co-pay is $75.00.  Ran test claim for Lantus Pen and Requres Prior Authorization  Ran test claim for [employer] and Requres Prior Authorization  Ran test claim for [employer] and Requres Prior Authorization   This test claim was processed through [employer]- copay amounts may vary at other pharmacies due to [employer], or as the patient moves through the different stages of their insurance plan.    Roland Earl, CPHT Pharmacy Patient Advocate Specialist [facility] Patient Advocate Team Direct Number: [phone]  Fax: [phone]

## 2022-07-26 NOTE — Inpatient Diabetes Management (Signed)
Inpatient Diabetes Program Recommendations  AACE/ADA: New Consensus Statement on Inpatient Glycemic Control (2015)  Target Ranges:  Prepandial:   less than 140 mg/dL      Peak postprandial:   less than 180 mg/dL (1-2 hours)      Critically ill patients:  140 - 180 mg/dL   Lab Results  Component Value Date   GLUCAP 114 (H) 07/26/2022   HGBA1C 11.2 (H) 07/13/2022    Discharge Recommendations: Long acting recommendations: Insulin Glargine (BASAGLAR) Kwikpen 25 units BID  Short acting recommendations:  Meal coverage ONLY Insulin aspart (NOVOLOG) FlexPen  5 units TID with meals     Spoke with patient and husband at bedside.  Educated patient on insulin pen use at home. Reviewed contents of insulin flexpen starter kit. Reviewed all steps of insulin pen including attachment of needle, 2-unit air shot, dialing up dose, giving injection, removing needle, disposal of sharps, storage of unused insulin, disposal of insulin etc. Patient able to provide successful return demonstration. Also reviewed troubleshooting with insulin pen. MD to give patient Rxs for insulin pens and insulin pen needles.   Placed Freestyle Libre to the back of her left arm.  Educated her on use, alarms, how to remove, and where to apply.  The FSL3 needs a PA.  Her PCP can start this.  Gave her 2 samples to last her a few weeks.    She had a new pt appointment with Dr. Fransico Him but missed it due to being hospitalized.  She will call today and reschedule.    Educated on The Plate Method, CHO's, portion control, CBGs at home fasting and mid afternoon, F/U with PCP every 3 months, bring meter to PCP office, long and short term complications of uncontrolled BG, and importance of exercise.   Will continue to follow while inpatient.  Thank you, Dulce Sellar, MSN, CDCES Diabetes Coordinator Inpatient Diabetes Program [phone] (team pager from 8a-5p)

## 2022-07-26 NOTE — Progress Notes (Addendum)
      [address]       [place] [address]             [phone]      5 Days Post-Op Procedure(s) (LRB): CORONARY ARTERY BYPASS GRAFTING (CABG) TIMES FOUR USING LEFT INTERNAL MAMMARY AND LEFT SAPHENOUS VEIN (N/A) TRANSESOPHAGEAL ECHOCARDIOGRAM (N/A) ENDOVEIN HARVEST OF GREATER SAPHENOUS VEIN (Left) Subjective:  Resting in bed, says she had a good day yesterday.  Wants to go home.    Objective: Vital signs in last 24 hours: Temp:  [97.6 F (36.4 C)-98.8 F (37.1 C)] 97.7 F (36.5 C) (07/01 0436) Pulse Rate:  [78-79] 79 (06/30 1650) Cardiac Rhythm: Normal sinus rhythm (06/30 1931) Resp:  [16-20] 18 (07/01 0436) BP: (101-120)/(61-75) 101/61 (07/01 0436) SpO2:  [93 %-95 %] 94 % (07/01 0436) Weight:  [100.2 kg] 100.2 kg (07/01 0549)  Hemodynamic parameters for last 24 hours:    Intake/Output from previous day: 06/30 0701 - 07/01 0700 In: 960 [P.O.:960] Out: -  Intake/Output this shift: No intake/output data recorded.  General appearance: alert, cooperative, and no distress Neurologic: intact Heart: regular rate and rhythm, no arrhythmias on monitor review Lungs: clear to auscultation bilaterally Abdomen: soft, non-tender Extremities: no LE edema Wounds: Clean and dry  Lab Results: Recent Labs    07/24/22 0109 07/25/22 0132  WBC 7.1 6.5  HGB 9.3* 9.2*  HCT 29.8* 29.4*  PLT 172 224    BMET:  Recent Labs    07/24/22 0109 07/25/22 0132  NA 136 135  K 4.2 3.7  CL 103 101  CO2 25 23  GLUCOSE 122* 129*  BUN 11 9  CREATININE 0.68 0.57  CALCIUM 8.3* 8.4*     PT/INR: No results for input(s): "LABPROT", "INR" in the last 72 hours. ABG    Component Value Date/Time   PHART 7.315 (L) 07/21/2022 1954   HCO3 21.3 07/21/2022 1954   TCO2 23 07/21/2022 1954   ACIDBASEDEF 4.0 (H) 07/21/2022 1954   O2SAT 98 07/21/2022 1954   CBG (last 3)  Recent Labs    07/25/22 2124 07/26/22 0546 07/26/22 0732  GLUCAP 164* 126* 114*      Assessment/Plan: S/P Procedure(s) (LRB): CORONARY ARTERY BYPASS GRAFTING (CABG) TIMES FOUR USING LEFT INTERNAL MAMMARY AND LEFT SAPHENOUS VEIN (N/A) TRANSESOPHAGEAL ECHOCARDIOGRAM (N/A) ENDOVEIN HARVEST OF GREATER SAPHENOUS VEIN (Left)  -POD5 CABG: Hx of NSTEMI, on Plavix,  Lopressor 12.5mg  BID, ASA, Lipitor   -Pulm: Saturating well on RA. .    -GI: +BM. OK appetite.    -Endo:  Hx of DM, preop A1c 11.2. Pt will require close follow up with PCP, pt cannot afford Victoza, so will plan to discharge on insulin per DM Coordinator recommendations. TOC team also checking on insurance coverage for continuous glucose monitor and basal insulins today.   Hx of hypothyroid, on Synthroid.    Renal: At preop weight, will discontinue Lasix at discharge.    Expected postop ABLA: Stable, H/H 9.2/29.4   DVT Prophylaxis: Lovenox, ambulation   Dispo: Plan to D/C home today after discussing case withTOC for assistance with DM medications and follow ups.    LOS: 13 days    Leary Roca, PA-C 07/26/2022  Appreciate DM coordinators recommendations Home this afternoon  Salvatore Decent. Dorris Fetch, MD [facility] [phone]

## 2022-07-26 NOTE — TOC Transition Note (Addendum)
Transition of Care (TOC) - CM/SW Discharge Note Donn Pierini RN, BSN Transitions of Care Unit 4E- RN Case Manager See Treatment Team for direct phone #   Patient Details  Name: Alisha Williams MRN: [id number] Date of Birth: 11-27-1955  [facility]) CM/SW Contact:  Darrold Span, RN Phone Number: 07/26/2022, 3:57 PM   Clinical Narrative:    Pt stable for transition home today, CM had been notified by Liaison that they had TCTS office referral for [facility] needs- spoke liaison and confirmed that [facility] will NOT be able to accept referral at this time due to staffing in pt's area and they are also not taking pt's insurance currently.  Review of chart notes pt does not have any HH needs at this time and no orders have been placed for discharge.   Pt to return home with spouse- spouse to transport home.  Meds to be filled at [facility] pharmacy.  Pt to follow up as per AVS instructions.    Final next level of care: Home/Self Care Barriers to Discharge: No Barriers Identified   Patient Goals and CMS Choice CMS Medicare.gov Compare Post Acute Care list provided to:: Patient Choice offered to / list presented to : Patient  Discharge Placement               Home          Discharge Plan and Services Additional resources added to the After Visit Summary for     Discharge Planning Services: CM Consult Post Acute Care Choice: NA          DME Arranged: N/A DME Agency: NA         HH Agency: [facility]        Social Determinants of Health (SDOH) Interventions SDOH Screenings   Food Insecurity: No Food Insecurity (07/13/2022)  Housing: Low Risk  (07/13/2022)  Transportation Needs: No Transportation Needs (07/13/2022)  Utilities: Not At Risk (07/13/2022)  Tobacco Use: Unknown (07/26/2022)     Readmission Risk Interventions    07/26/2022   12:45 PM  Readmission Risk Prevention Plan  Transportation Screening Complete  PCP or Specialist Appt within 5-7 Days  Complete  Home Care Screening Complete  Medication Review (RN CM) Complete

## 2022-07-26 NOTE — Telephone Encounter (Signed)
Pharmacy Patient Advocate Encounter   Received notification that prior authorization for FreeStyle Libre 3 Sensor is required/requested.    PA submitted to [employer] via CoverMyMeds Key/confirmation #/EOC [id number] Status is pending

## 2022-07-26 NOTE — Progress Notes (Signed)
CARDIAC REHAB PHASE I    Reviewed post [facility] education provided on Saturday. All questions and concerns addressed. Pt has DME needed for home. Eager to get home and have shower. Pt referral sent to [facility] for CRP2. Plan for home later today.  0900-0930  Woodroe Chen, RN BSN 07/26/2022 9:30 AM

## 2022-07-27 ENCOUNTER — Other Ambulatory Visit (HOSPITAL_COMMUNITY): Payer: Self-pay

## 2022-07-27 NOTE — Telephone Encounter (Signed)
Pharmacy Patient Advocate Encounter  Received notification from  [employer]  that Prior Authorization for [employer] 3 Sensor  has been APPROVED from 07/26/2022 to 07/30/2023.Marland Kitchen  Copay is $75.00

## 2022-07-28 ENCOUNTER — Ambulatory Visit: Payer: Self-pay | Admitting: "Endocrinology

## 2022-07-30 MED FILL — Electrolyte-R (PH 7.4) Solution: INTRAVENOUS | Qty: 3000 | Status: AC

## 2022-07-30 MED FILL — Lidocaine HCl Local Soln Prefilled Syringe 100 MG/5ML (2%): INTRAMUSCULAR | Qty: 5 | Status: AC

## 2022-07-30 MED FILL — Heparin Sodium (Porcine) Inj 1000 Unit/ML: INTRAMUSCULAR | Qty: 30 | Status: AC

## 2022-07-30 MED FILL — Calcium Chloride Inj 10%: INTRAVENOUS | Qty: 10 | Status: AC

## 2022-07-30 MED FILL — Sodium Chloride IV Soln 0.9%: INTRAVENOUS | Qty: 2000 | Status: AC

## 2022-07-30 MED FILL — Sodium Bicarbonate IV Soln 8.4%: INTRAVENOUS | Qty: 50 | Status: AC

## 2022-07-30 MED FILL — Mannitol IV Soln 20%: INTRAVENOUS | Qty: 500 | Status: AC

## 2022-08-10 ENCOUNTER — Encounter: Payer: Self-pay | Admitting: *Deleted

## 2022-08-10 ENCOUNTER — Telehealth: Payer: Self-pay | Admitting: Cardiology

## 2022-08-10 NOTE — Telephone Encounter (Signed)
Due to transportation reasons, patient would like a provider switch from Dr. Jens Som to Dr. Jenene Slicker. Please confirm transfer

## 2022-08-17 ENCOUNTER — Ambulatory Visit: Payer: 59 | Admitting: Student

## 2022-08-25 ENCOUNTER — Other Ambulatory Visit: Payer: Self-pay | Admitting: Internal Medicine

## 2022-08-25 ENCOUNTER — Telehealth: Payer: Self-pay | Admitting: Pharmacist

## 2022-08-25 ENCOUNTER — Encounter: Payer: Self-pay | Admitting: Internal Medicine

## 2022-08-25 ENCOUNTER — Ambulatory Visit: Payer: 59 | Attending: Student | Admitting: Internal Medicine

## 2022-08-25 ENCOUNTER — Telehealth: Payer: Self-pay | Admitting: Internal Medicine

## 2022-08-25 VITALS — BP 120/70 | HR 88 | Ht 68.0 in | Wt 222.6 lb

## 2022-08-25 DIAGNOSIS — Z794 Long term (current) use of insulin: Secondary | ICD-10-CM

## 2022-08-25 DIAGNOSIS — I214 Non-ST elevation (NSTEMI) myocardial infarction: Secondary | ICD-10-CM

## 2022-08-25 DIAGNOSIS — E1159 Type 2 diabetes mellitus with other circulatory complications: Secondary | ICD-10-CM

## 2022-08-25 DIAGNOSIS — Z7902 Long term (current) use of antithrombotics/antiplatelets: Secondary | ICD-10-CM

## 2022-08-25 MED ORDER — CLOPIDOGREL BISULFATE 75 MG PO TABS: 75.0000 mg | ORAL_TABLET | Freq: Every day | ORAL | 11 refills | Status: AC

## 2022-08-25 MED ORDER — NITROGLYCERIN 0.4 MG SL SUBL: 0.4000 mg | SUBLINGUAL_TABLET | SUBLINGUAL | 1 refills | Status: DC | PRN

## 2022-08-25 MED ORDER — METOPROLOL TARTRATE 25 MG PO TABS: 12.5000 mg | ORAL_TABLET | Freq: Two times a day (BID) | ORAL | 1 refills | Status: AC

## 2022-08-25 MED ORDER — ASPIRIN 81 MG PO TBEC: 81.0000 mg | DELAYED_RELEASE_TABLET | Freq: Every day | ORAL | 3 refills | Status: DC

## 2022-08-25 NOTE — Telephone Encounter (Signed)
Pt left the office this morning before the nurse was able to give her the information from her visit. Pt called back to the office and stated that she had to leave immediately because her husband was having a bout of diarrhea. I advised the pt that I would put a note in and have the nurse call her back with information.

## 2022-08-25 NOTE — Patient Instructions (Addendum)
Medication Instructions:  Your physician has recommended you make the following change in your medication:  Nitroglycerin .04 mg as needed. Dissolve one under tongue for chest pain every 5 minutes up to 3 doses. If no relief, proceed to ED.   Labwork: None  Testing/Procedures: None  Follow-Up: Your physician recommends that you schedule a follow-up appointment in: 6 months  Any Other Special Instructions Will Be Listed Below (If Applicable).  If you need a refill on your cardiac medications before your next appointment, please call your pharmacy.

## 2022-08-25 NOTE — Telephone Encounter (Addendum)
Pt with uncontrolled type 2 DM with most recent A1c > 11%.   Will forward to scheduling to reach out to pt to schedule PharmD appt to discuss med initiation, [address] or [facility] office are ok. Pt does not want to drive to [place], 30 min phone visit is ok for scheduling.

## 2022-08-25 NOTE — Telephone Encounter (Signed)
-----   Message from [facility] Vitalia H sent at 08/25/2022 11:18 AM EDT ----- Regarding: Ozempic Good morning,  I was reaching out to see if patient will be approved for Ozempic. Dr. Jenene Slicker is wanting to start her on this medication. Are you able to help with this process?

## 2022-08-25 NOTE — Telephone Encounter (Signed)
Patient contacted

## 2022-08-26 ENCOUNTER — Other Ambulatory Visit: Payer: Self-pay | Admitting: Internal Medicine

## 2022-08-26 ENCOUNTER — Telehealth: Payer: Self-pay

## 2022-08-26 DIAGNOSIS — Z7902 Long term (current) use of antithrombotics/antiplatelets: Secondary | ICD-10-CM | POA: Insufficient documentation

## 2022-08-26 MED ORDER — SEMAGLUTIDE(0.25 OR 0.5MG/DOS) 2 MG/3ML ~~LOC~~ SOPN: PEN_INJECTOR | SUBCUTANEOUS | 0 refills | Status: DC

## 2022-08-26 NOTE — Telephone Encounter (Signed)
Called patient back in regards to her Ozempic medication. I had her to verify name and DOB. Spoke with her in the beginning to let her know that medication has been ordered and that in 3 months on last dose would need to come in for a nurse visit. Apparently Mrs. Ditmars had the call on speaker because she was no longer speaking with me, her husband was on the line and proceeds to tell me: "She is not coming in for d** appointment to check her weight wasting my d** gas." Also that, "she wasn't coming back up here at all" I asked to speak back with Mrs. Crisafulli as too his name is not on DPR. I called her name and he said, "this ain't Mrs. Yetta Barre. I said that I would need to speak with her he said, f** you. I said you will not talk to me like that. He said "F** you" again. At that point I disconnected call. No nurse visit was made at this time since I could not speak directly to patient.

## 2022-08-26 NOTE — Progress Notes (Signed)
Cardiology Office Note  Date: 08/26/2022   ID: Alisha Williams, DOB [birth year], MRN [id number]  PCP:  Juliette Alcide, MD  Cardiologist:  Marjo Bicker, MD Electrophysiologist:  None    History of Present Illness: Alisha Williams is a 67 y.o. female known to have CAD manifested by NSTEMI in 6/24 s/p 4v CABG with LVEF 50 to 55%, hypothyroidism, DM 2 is here for follow-up visit.  No angina after CABG although he complained of sharp chest pains with coughing, sneezing etc. which was different in quality compared to the index chest pains when she had NSTEMI.  No DOE, orthopnea, PND, leg swelling.  Compliant with medications and has no side effects.  No dizziness, syncope, palpitations.  Past Medical History:  Diagnosis Date   Anemia    nos   Anxiety    Depression    Diabetes mellitus without complication (HCC)    Hypothyroidism     Past Surgical History:  Procedure Laterality Date   CESAREAN SECTION     CORONARY ARTERY BYPASS GRAFT N/A 07/21/2022   Procedure: CORONARY ARTERY BYPASS GRAFTING (CABG) TIMES FOUR USING LEFT INTERNAL MAMMARY AND LEFT SAPHENOUS VEIN;  Surgeon: Loreli Slot, MD;  Location: [facility] OR;  Service: Open Heart Surgery;  Laterality: N/A;   ENDOVEIN HARVEST OF GREATER SAPHENOUS VEIN Left 07/21/2022   Procedure: ENDOVEIN HARVEST OF GREATER SAPHENOUS VEIN;  Surgeon: Loreli Slot, MD;  Location: [facility] OR;  Service: Open Heart Surgery;  Laterality: Left;   LEFT HEART CATH AND CORONARY ANGIOGRAPHY N/A 07/14/2022   Procedure: LEFT HEART CATH AND CORONARY ANGIOGRAPHY;  Surgeon: Corky Crafts, MD;  Location: [facility] INVASIVE CV LAB;  Service: Cardiovascular;  Laterality: N/A;   strectch kidney tube  1970   TEE WITHOUT CARDIOVERSION N/A 07/21/2022   Procedure: TRANSESOPHAGEAL ECHOCARDIOGRAM;  Surgeon: Loreli Slot, MD;  Location: [facility] OR;  Service: Open Heart Surgery;  Laterality: N/A;   TONSILLECTOMY     TUBAL LIGATION      Current Outpatient  Medications  Medication Sig Dispense Refill   acetaminophen (TYLENOL) 325 MG tablet Take 2 tablets (650 mg total) by mouth every 6 (six) hours as needed.     CINNAMON PO Take by mouth.     Continuous Glucose Sensor (DEXCOM G7 SENSOR) MISC 1 Device by Does not apply route continuous. 1 each 0   Continuous Glucose Sensor (FREESTYLE LIBRE 3 SENSOR) MISC 1 Device by Does not apply route continuous. Place 1 sensor on the skin every 14 days. Use to check glucose continuously 1 each 0   FARXIGA 10 MG TABS tablet Take 10 mg by mouth daily.     ferrous sulfate 325 (65 FE) MG tablet Take 325 mg by mouth daily with breakfast.       glucose blood (ONETOUCH VERIO) test strip 1 each by Other route as needed for other. Use as instructed     guaiFENesin (MUCINEX) 600 MG 12 hr tablet Take 1 tablet (600 mg total) by mouth 2 (two) times daily as needed for cough or to loosen phlegm.     insulin glargine-yfgn (SEMGLEE) 100 UNIT/ML Pen Inject 25 Units into the skin 2 (two) times daily. 15 mL 6   insulin lispro (HUMALOG) 100 UNIT/ML KwikPen Inject 5 Units into the skin with breakfast, with lunch, and with evening meal. 15 mL 3   Insulin Pen Needle 32G X 4 MM MISC Use two times a day. 100 each 2   Iodine, Kelp, (KELP PO) Take  by mouth.     levothyroxine (SYNTHROID) 125 MCG tablet Take 125 mcg by mouth daily before breakfast.     MULTIPLE VITAMIN PO Take 1 tablet by mouth daily.      nitroGLYCERIN (NITROSTAT) 0.4 MG SL tablet Place 1 tablet (0.4 mg total) under the tongue every 5 (five) minutes x 3 doses as needed for chest pain (if no relief after 3rd dose proceed to ED or call 911). 25 tablet 1   ONETOUCH DELICA LANCETS FINE MISC by Does not apply route. Use as instructed     Semaglutide,0.25 or 0.5MG /DOS, 2 MG/3ML SOPN Inject 0.25 mg into the skin once a week for 28 days, THEN 0.5 mg once a week for 28 days, THEN 1 mg once a week for 28 days. 3 mL 0   aspirin EC 81 MG tablet Take 1 tablet (81 mg total) by mouth daily.  120 tablet 3   clopidogrel (PLAVIX) 75 MG tablet Take 1 tablet (75 mg total) by mouth daily. 30 tablet 11   metoprolol tartrate (LOPRESSOR) 25 MG tablet Take 1/2 tablet (12.5 mg total) by mouth 2 (two) times daily. 60 tablet 1   No current facility-administered medications for this visit.   Allergies:  Metformin and related   Social History: The patient  reports that she has never smoked. She does not have any smokeless tobacco history on file. She reports that she does not drink alcohol.   Family History: The patient's family history includes Arthritis in her mother; Breast cancer in her mother and sister; Fibromyalgia in her mother; Heart attack in her father; Hypertension in her mother; Thyroid disease in her mother and sister.   ROS:  Please see the history of present illness. Otherwise, complete review of systems is positive for none  All other systems are reviewed and negative.   Physical Exam: VS:  BP 120/70   Pulse 88   Ht 5\' 8"  (1.727 m)   Wt 222 lb 9.6 oz (101 kg)   SpO2 (!) 89%   BMI 33.85 kg/m , BMI Body mass index is 33.85 kg/m.  Wt Readings from Last 3 Encounters:  08/25/22 222 lb 9.6 oz (101 kg)  07/26/22 220 lb 14.4 oz (100.2 kg)  11/01/14 263 lb 1.6 oz (119.3 kg)    General: Patient appears comfortable at rest. HEENT: Conjunctiva and lids normal, oropharynx clear with moist mucosa. Neck: Supple, no elevated JVP or carotid bruits, no thyromegaly. Lungs: Clear to auscultation, nonlabored breathing at rest. Cardiac: Regular rate and rhythm, no S3 or significant systolic murmur, no pericardial rub. Abdomen: Soft, nontender, no hepatomegaly, bowel sounds present, no guarding or rebound. Extremities: No pitting edema, distal pulses 2+. Skin: Warm and dry. Musculoskeletal: No kyphosis. Neuropsychiatric: Alert and oriented x3, affect grossly appropriate.  Recent Labwork: 07/13/2022: ALT 21; AST 31 07/14/2022: B Natriuretic Peptide 217.7; TSH 5.031 07/22/2022:  Magnesium 2.0 07/25/2022: BUN 9; Creatinine, Ser 0.57; Hemoglobin 9.2; Platelets 224; Potassium 3.7; Sodium 135     Component Value Date/Time   CHOL 223 (H) 07/14/2022 0749   TRIG 442 (H) 07/14/2022 0749   HDL 35 (L) 07/14/2022 0749   CHOLHDL 6.4 07/14/2022 0749   VLDL UNABLE TO CALCULATE IF TRIGLYCERIDE OVER 400 mg/dL 10/93/2355 7322   LDLCALC UNABLE TO CALCULATE IF TRIGLYCERIDE OVER 400 mg/dL 02/54/2706 2376   LDLDIRECT 124 (H) 07/14/2022 0749    Assessment and Plan:  # CAD manifested by NSTEMI in 6/24 s/p 4v CABG (LIMA to LAD, SVG to diagonal, SVG  to OM to PL sequential) with LVEF 50 to 55%, angina free -Continue DAPT for total duration of 1 year (aspirin 81 mg once daily and Plavix 75 mg once daily) -Patient refused to be on statin and injectables after extensive discussion. Discussed about initiating bempedoic acid to which husband vehemently refused but patient said she will think about it. -Continue metoprolol tartrate 12.5 mg twice daily -SL NG 0.4 mg as needed -ER precautions for chest pain  # HLD, not at goal # Hypertriglyceridemia, not at goal -LDL from 6/24 was 124 and TG was 442 (both elevated). Patient was on atorvastatin during 6/24 hospitalization but upon discharge, she stopped taking it due to side effects. I had extensive discussion about the benefits of statin therapy (due to recent NSTEMI, CABG) but patient and her husband refused to be on statin due to side effects of myalgias.  Refused injectables. I discussed about initiating bempedoic acid to which the patient is eager and was considering it but the patient's husband vehemently refused. Patient and her husband also said they will try their best to not to be on any cholesterol medications.  # Diabetes mellitus type 2 -Patient will qualify and will benefit from initiation of Ozempic due to BMI 33.85, DM type II and recent CABG. Hopefully initiation of Ozempic will decrease LDL and TG levels to some extent.  She will  need a nurse visit in 3 months for weight check and based on that, will prescribe maintenance therapy.    I have spent a total of 30 minutes with patient reviewing chart, EKGs, labs and examining patient as well as establishing an assessment and plan that was discussed with the patient.  > 50% of time was spent in direct patient care.    Medication Adjustments/Labs and Tests Ordered: Current medicines are reviewed at length with the patient today.  Concerns regarding medicines are outlined above.   Tests Ordered: No orders of the defined types were placed in this encounter.   Medication Changes: Meds ordered this encounter  Medications   nitroGLYCERIN (NITROSTAT) 0.4 MG SL tablet    Sig: Place 1 tablet (0.4 mg total) under the tongue every 5 (five) minutes x 3 doses as needed for chest pain (if no relief after 3rd dose proceed to ED or call 911).    Dispense:  25 tablet    Refill:  1    08/25/2022-New   clopidogrel (PLAVIX) 75 MG tablet    Sig: Take 1 tablet (75 mg total) by mouth daily.    Dispense:  30 tablet    Refill:  11   metoprolol tartrate (LOPRESSOR) 25 MG tablet    Sig: Take 1/2 tablet (12.5 mg total) by mouth 2 (two) times daily.    Dispense:  60 tablet    Refill:  1   aspirin EC 81 MG tablet    Sig: Take 1 tablet (81 mg total) by mouth daily.    Dispense:  120 tablet    Refill:  3   Semaglutide,0.25 or 0.5MG /DOS, 2 MG/3ML SOPN    Sig: Inject 0.25 mg into the skin once a week for 28 days, THEN 0.5 mg once a week for 28 days, THEN 1 mg once a week for 28 days.    Dispense:  3 mL    Refill:  0    Disposition:  Follow up  6 months  Signed Mcarthur Ivins Verne Spurr, MD, 08/26/2022 9:39 AM    [facility] at [facility] [address]  [address] [place], [place], [place] [address]

## 2022-08-30 ENCOUNTER — Other Ambulatory Visit: Payer: Self-pay | Admitting: Thoracic Surgery (Cardiothoracic Vascular Surgery)

## 2022-08-30 ENCOUNTER — Telehealth: Payer: Self-pay

## 2022-08-30 ENCOUNTER — Other Ambulatory Visit (HOSPITAL_COMMUNITY): Payer: Self-pay

## 2022-08-30 DIAGNOSIS — Z951 Presence of aortocoronary bypass graft: Secondary | ICD-10-CM

## 2022-08-30 NOTE — Telephone Encounter (Signed)
Pharmacy Patient Advocate Encounter   Received notification from Physician's Office that prior authorization for [facility] is required/requested.   Insurance verification completed.   The patient is insured through  [facility]  .   Per test claim: PA required; PA submitted to [facility] via CoverMyMeds Key/confirmation #/EOC [id number] Status is pending

## 2022-08-30 NOTE — Telephone Encounter (Signed)
PA request has been Submitted. New Encounter created for follow up. For additional info see Pharmacy Prior Auth telephone encounter from 08/30/22.

## 2022-08-31 ENCOUNTER — Ambulatory Visit (INDEPENDENT_AMBULATORY_CARE_PROVIDER_SITE_OTHER): Payer: Self-pay | Admitting: Surgical

## 2022-08-31 ENCOUNTER — Ambulatory Visit
Admission: RE | Admit: 2022-08-31 | Discharge: 2022-08-31 | Disposition: A | Discharge status: Home / Self Care | Payer: 59 | Source: Ambulatory Visit | Attending: Thoracic Surgery (Cardiothoracic Vascular Surgery) | Admitting: Thoracic Surgery (Cardiothoracic Vascular Surgery)

## 2022-08-31 ENCOUNTER — Ambulatory Visit: Payer: BC Managed Care – PPO | Admitting: Thoracic Surgery (Cardiothoracic Vascular Surgery)

## 2022-08-31 VITALS — BP 142/85 | HR 68 | Resp 20 | Ht 68.0 in | Wt 221.0 lb

## 2022-08-31 DIAGNOSIS — Z951 Presence of aortocoronary bypass graft: Secondary | ICD-10-CM

## 2022-08-31 NOTE — Progress Notes (Signed)
[address]       [place] [address]             [phone]      Alisha Williams [facility] Medical Record #[id number] Date of Birth: 05/11/1955  Referring: Corky Crafts, MD Primary Care: Juliette Alcide, MD Primary Cardiologist: Marjo Bicker, MD   Chief Complaint:   POST OP FOLLOW UP   Operative Report    DATE OF PROCEDURE: 07/21/2022   PREOPERATIVE DIAGNOSIS:  Three-vessel coronary disease, status post nonST elevation myocardial infarction.   POSTOPERATIVE DIAGNOSIS:  Three-vessel coronary disease, status post nonST elevation myocardial infarction.   PROCEDURE PERFORMED:   Median sternotomy, extracorporeal circulation,  Coronary artery bypass grafting x4  Left internal mammary artery to LAD,  Saphenous vein graft to first diagonal,  Sequential saphenous vein graft to obtuse marginal 1 and posterolateral Endoscopic vein harvest, left leg.   SURGEON:  Salvatore Decent. Dorris Fetch, MD   ASSISTANT:  Jillyn Hidden, PA   History of Present Illness: Patient is a 67 year old female status post the above procedure seen in the office today as a routine postsurgical follow-up.  Overall she reports that she is doing well.  She is not requiring pain medication with the exception of an occasional Tylenol.  She is not having any chest pain, shortness of breath, palpitations or lower extremity edema.  She has had no difficulty with her incisions.  She denies fevers, chills or other significant constitutional symptoms.  She has been seen in cardiology follow-up.  Blood sugars have been under fairly good control and she has a wearable monitor.      Past Medical History:  Diagnosis Date   Anemia    nos   Anxiety    Depression    Diabetes mellitus without complication (HCC)    Hypothyroidism      Social History   Tobacco Use  Smoking Status Never  Smokeless Tobacco Not on file    Social History   Substance and Sexual Activity  Alcohol  Use No     Allergies  Allergen Reactions   Metformin And Related Other (See Comments)    Weakness & fatigue    Current Outpatient Medications  Medication Sig Dispense Refill   acetaminophen (TYLENOL) 325 MG tablet Take 2 tablets (650 mg total) by mouth every 6 (six) hours as needed.     aspirin EC 81 MG tablet Take 1 tablet (81 mg total) by mouth daily. 120 tablet 3   CINNAMON PO Take by mouth.     clopidogrel (PLAVIX) 75 MG tablet Take 1 tablet (75 mg total) by mouth daily. 30 tablet 11   Continuous Glucose Sensor (DEXCOM G7 SENSOR) MISC 1 Device by Does not apply route continuous. 1 each 0   Continuous Glucose Sensor (FREESTYLE LIBRE 3 SENSOR) MISC 1 Device by Does not apply route continuous. Place 1 sensor on the skin every 14 days. Use to check glucose continuously 1 each 0   FARXIGA 10 MG TABS tablet Take 10 mg by mouth daily.     ferrous sulfate 325 (65 FE) MG tablet Take 325 mg by mouth daily with breakfast.       glucose blood (ONETOUCH VERIO) test strip 1 each by Other route as needed for other. Use as instructed     guaiFENesin (MUCINEX) 600 MG 12 hr tablet Take 1 tablet (600 mg total) by mouth 2 (two) times daily as needed for cough or to loosen phlegm.  insulin glargine-yfgn (SEMGLEE) 100 UNIT/ML Pen Inject 25 Units into the skin 2 (two) times daily. 15 mL 6   insulin lispro (HUMALOG) 100 UNIT/ML KwikPen Inject 5 Units into the skin with breakfast, with lunch, and with evening meal. 15 mL 3   Insulin Pen Needle 32G X 4 MM MISC Use two times a day. 100 each 2   Iodine, Kelp, (KELP PO) Take by mouth.     levothyroxine (SYNTHROID) 125 MCG tablet Take 125 mcg by mouth daily before breakfast.     metoprolol tartrate (LOPRESSOR) 25 MG tablet Take 1/2 tablet (12.5 mg total) by mouth 2 (two) times daily. 60 tablet 1   MULTIPLE VITAMIN PO Take 1 tablet by mouth daily.      nitroGLYCERIN (NITROSTAT) 0.4 MG SL tablet Place 1 tablet (0.4 mg total) under the tongue every 5 (five)  minutes x 3 doses as needed for chest pain (if no relief after 3rd dose proceed to ED or call 911). 25 tablet 1   ONETOUCH DELICA LANCETS FINE MISC by Does not apply route. Use as instructed     Semaglutide,0.25 or 0.5MG /DOS, 2 MG/3ML SOPN Inject 0.25 mg into the skin once a week for 28 days, THEN 0.5 mg once a week for 28 days, THEN 1 mg once a week for 28 days. 3 mL 0   No current facility-administered medications for this visit.       Physical Exam: BP (!) 142/85   Pulse 68   Resp 20   Ht 5\' 8"  (1.727 m)   Wt 221 lb (100.2 kg)   SpO2 98% Comment: RA  BMI 33.60 kg/m   General appearance: alert, cooperative, and no distress Heart: regular rate and rhythm Lungs: clear to auscultation bilaterally Abdomen: Benign exam Extremities: No edema Wound: Incisions healing well   Diagnostic Studies & Laboratory data:     Recent Radiology Findings:   DG Chest 2 View  Result Date: 08/31/2022 CLINICAL DATA:  cabg EXAM: CHEST - 2 VIEW COMPARISON:  07/26/2022 FINDINGS: Lungs are clear. Heart size and mediastinal contours are within normal limits. CABG markers. No effusion. Sternotomy wires. IMPRESSION: No acute findings post CABG. Electronically Signed   By: Corlis Leak M.D.   On: 08/31/2022 14:15      Recent Lab Findings: Lab Results  Component Value Date   WBC 6.5 07/25/2022   HGB 9.2 (L) 07/25/2022   HCT 29.4 (L) 07/25/2022   PLT 224 07/25/2022   GLUCOSE 129 (H) 07/25/2022   CHOL 223 (H) 07/14/2022   TRIG 442 (H) 07/14/2022   HDL 35 (L) 07/14/2022   LDLDIRECT 124 (H) 07/14/2022   LDLCALC UNABLE TO CALCULATE IF TRIGLYCERIDE OVER 400 mg/dL 16/10/9602   ALT 21 54/09/8117   AST 31 07/13/2022   NA 135 07/25/2022   K 3.7 07/25/2022   CL 101 07/25/2022   CREATININE 0.57 07/25/2022   BUN 9 07/25/2022   CO2 23 07/25/2022   TSH 5.031 (H) 07/14/2022   INR 1.3 (H) 07/21/2022   HGBA1C 11.2 (H) 07/13/2022      Assessment / Plan: Excellent overall progress in her postsurgical  recovery.  I reviewed her chest x-ray and there are no concerning findings.  Not make any changes to her current medication regimen.  We will see the patient again on a as needed basis for any surgically related needs or at her request.      Medication Changes: No orders of the defined types were placed in this encounter.  Rowe Clack, PA-C  08/31/2022 2:44 PM

## 2022-08-31 NOTE — Patient Instructions (Signed)
Discussed routine activity progression including driving and lifting restrictions

## 2022-09-06 ENCOUNTER — Ambulatory Visit: Payer: Self-pay | Admitting: "Endocrinology

## 2022-09-07 ENCOUNTER — Other Ambulatory Visit (HOSPITAL_COMMUNITY): Payer: Self-pay

## 2022-09-07 NOTE — Telephone Encounter (Signed)
Pharmacy Patient Advocate Encounter  Received notification from  [facility]  that Prior Authorization for [facility] has been APPROVED from 08/2022 to 08/2023. Refill too soon.

## 2022-09-07 NOTE — Telephone Encounter (Signed)
Multiple attempts to reach patient by phone with no success and no return call.

## 2022-09-16 ENCOUNTER — Other Ambulatory Visit: Payer: Self-pay | Admitting: Internal Medicine

## 2022-10-19 ENCOUNTER — Other Ambulatory Visit: Payer: Self-pay | Admitting: Internal Medicine

## 2023-08-26 ENCOUNTER — Other Ambulatory Visit: Payer: Self-pay | Admitting: Internal Medicine

## 2023-08-27 ENCOUNTER — Other Ambulatory Visit: Payer: Self-pay | Admitting: Internal Medicine

## 2023-08-29 NOTE — Telephone Encounter (Signed)
 For review- is pt supposed to be taking clopidogrel  (PLAVIX ) 75 MG tablet (Expired)

## 2023-08-30 ENCOUNTER — Telehealth: Payer: Self-pay | Admitting: Internal Medicine

## 2023-08-30 ENCOUNTER — Other Ambulatory Visit: Payer: Self-pay | Admitting: Internal Medicine

## 2023-08-30 NOTE — Telephone Encounter (Signed)
 Per Dr.Mallipeddi She does not have to be on plavix  anymore. Continue ASA 81 indefinitely.

## 2024-02-03 ENCOUNTER — Other Ambulatory Visit: Payer: Self-pay | Admitting: Internal Medicine
